# Patient Record
Sex: Female | Born: 1970
Health system: Southern US, Community
[De-identification: ages and names within clinical notes are randomized; demographics above are authoritative.]

## PROBLEM LIST (undated history)

## (undated) DIAGNOSIS — Z8709 Personal history of other diseases of the respiratory system: Secondary | ICD-10-CM

## (undated) DIAGNOSIS — F32A Depression, unspecified: Secondary | ICD-10-CM

## (undated) DIAGNOSIS — I8500 Esophageal varices without bleeding: Secondary | ICD-10-CM

## (undated) DIAGNOSIS — Z87898 Personal history of other specified conditions: Secondary | ICD-10-CM

## (undated) DIAGNOSIS — F329 Major depressive disorder, single episode, unspecified: Secondary | ICD-10-CM

## (undated) DIAGNOSIS — E8801 Alpha-1-antitrypsin deficiency: Secondary | ICD-10-CM

## (undated) DIAGNOSIS — Z9049 Acquired absence of other specified parts of digestive tract: Secondary | ICD-10-CM

## (undated) DIAGNOSIS — T7840XA Allergy, unspecified, initial encounter: Secondary | ICD-10-CM

## (undated) DIAGNOSIS — J45909 Unspecified asthma, uncomplicated: Secondary | ICD-10-CM

## (undated) DIAGNOSIS — H269 Unspecified cataract: Secondary | ICD-10-CM

## (undated) DIAGNOSIS — K7031 Alcoholic cirrhosis of liver with ascites: Secondary | ICD-10-CM

## (undated) DIAGNOSIS — K769 Liver disease, unspecified: Secondary | ICD-10-CM

## (undated) DIAGNOSIS — F419 Anxiety disorder, unspecified: Secondary | ICD-10-CM

## (undated) HISTORY — DX: Unspecified asthma, uncomplicated: J45.909

## (undated) HISTORY — DX: Depression, unspecified: F32.A

## (undated) HISTORY — DX: Major depressive disorder, single episode, unspecified: F32.9

## (undated) HISTORY — DX: Personal history of other specified conditions: Z87.898

## (undated) HISTORY — DX: Unspecified cataract: H26.9

## (undated) HISTORY — DX: Allergy, unspecified, initial encounter: T78.40XA

## (undated) HISTORY — DX: Personal history of other diseases of the respiratory system: Z87.09

## (undated) HISTORY — DX: Esophageal varices without bleeding: I85.00

## (undated) HISTORY — DX: Alcoholic cirrhosis of liver with ascites: K70.31

## (undated) HISTORY — DX: Acquired absence of other specified parts of digestive tract: Z90.49

## (undated) HISTORY — DX: Liver disease, unspecified: K76.9

## (undated) HISTORY — DX: Alpha-1-antitrypsin deficiency: E88.01

## (undated) HISTORY — DX: Anxiety disorder, unspecified: F41.9

---

## 1998-07-23 HISTORY — PX: CHOLECYSTECTOMY: SHX55

## 2014-10-19 ENCOUNTER — Telehealth: Payer: Self-pay

## 2014-10-19 NOTE — Telephone Encounter (Signed)
Rec'd from [employer] forward 26 pages to Historical Provider

## 2014-10-20 ENCOUNTER — Telehealth: Payer: Self-pay

## 2014-10-20 NOTE — Telephone Encounter (Signed)
Received medical records from [facility]. 10/20/14/ss

## 2014-10-21 ENCOUNTER — Encounter: Payer: Self-pay | Admitting: Internal Medicine

## 2014-12-25 ENCOUNTER — Ambulatory Visit (INDEPENDENT_AMBULATORY_CARE_PROVIDER_SITE_OTHER): Payer: 59 | Admitting: Physician Assistant

## 2014-12-25 VITALS — BP 133/88 | HR 94 | Temp 98.3°F | Resp 20 | Ht 64.0 in | Wt 204.5 lb

## 2014-12-25 DIAGNOSIS — L559 Sunburn, unspecified: Secondary | ICD-10-CM | POA: Diagnosis not present

## 2014-12-25 MED ORDER — LIDOCAINE-PRILOCAINE 2.5-2.5 % EX CREA: 1.0000 "application " | TOPICAL_CREAM | CUTANEOUS | Status: DC | PRN

## 2014-12-25 MED ORDER — NAPROXEN 500 MG PO TABS: 500.0000 mg | ORAL_TABLET | Freq: Two times a day (BID) | ORAL | Status: DC

## 2014-12-25 NOTE — Progress Notes (Deleted)
   Subjective:  This chart was scribed for Nena Jordan, MD by [facility], medical scribe at [facility].The patient was seen in exam room ** and the patient's care was started at 4:43 PM.   Patient ID: Kara Torres, female    DOB: [birth year], 44 y.o.   MRN: [id number] Chief Complaint  Patient presents with  . Sunburn  . Depression    Per Triage Screen    HPI HPI Comments: Kara Torres is a 44 y.o. female who presents to [facility] complaining of ***    Review of Systems     Objective:  BP 133/88 mmHg  Pulse 94  Temp(Src) 98.3 F (36.8 C) (Oral)  Resp 20  Ht 5\' 4"  (1.626 m)  Wt 204 lb 8 oz (92.761 kg)  BMI 35.09 kg/m2  SpO2 99% Physical Exam  Vitals reviewed. CONSTITUTIONAL: Well developed/well nourished HEAD: Normocephalic/atraumatic EYES: EOMI/PERRL ENMT: Mucous membranes moist NECK: supple no meningeal signs SPINE/BACK:entire spine nontender CV: S1/S2 noted, no murmurs/rubs/gallops noted LUNGS: Lungs are clear to auscultation bilaterally, no apparent distress ABDOMEN: soft, nontender, no rebound or guarding, bowel sounds noted throughout abdomen GU:no cva tenderness NEURO: Pt is awake/alert/appropriate, moves all extremitiesx4.  No facial droop.   EXTREMITIES: pulses normal/equal, full ROM SKIN: warm, color normal PSYCH: no abnormalities of mood noted, alert and oriented to situation     Assessment & Plan:     *** {Add scribe attestation statement}
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
## 2014-12-25 NOTE — Patient Instructions (Signed)
Sunburn Sunburn is damage to the skin caused by overexposure to ultraviolet (UV) rays. People with light skin or a fair complexion may be more susceptible to sunburn. Repeated sun exposure causes early skin aging such as wrinkles and sun spots. It also increases the risk of skin cancer. CAUSES A sunburn is caused by getting too much UV radiation from the sun. SYMPTOMS  Red or pink skin.  Soreness and swelling.  Pain.  Blisters.  Peeling skin.  Headache, fever, and fatigue if sunburn covers a large area. TREATMENT  Your caregiver may tell you to take certain medicines to lessen inflammation.  Your caregiver may have you use hydrocortisone cream or spray to help with itching and inflammation.  Your caregiver may prescribe an antibiotic cream to use on blisters. HOME CARE INSTRUCTIONS   Avoid further exposure to the sun.  Cool baths and cool compresses may be helpful if used several times per day. Do not apply ice, since this may result in more damage to the skin.  Only take over-the-counter or prescription medicines for pain, discomfort, or fever as directed by your caregiver.  Use aloe or other over-the-counter sunburn creams or gels on your skin. Do not apply these creams or gels on blisters.  Drink enough fluids to keep your urine clear or pale yellow.  Do not break blisters. If blisters break, your caregiver may recommend an antibiotic cream to apply to the affected area. PREVENTION   Try to avoid the sun between 10:00 a.m. and 4:00 p.m. when it is the strongest.  Apply sunscreen at least 30 minutes before exposure to the sun.  Always wear protective hats, clothing, and sunglasses with UV protection.  Avoid medicines, herbs, and foods that increase your sensitivity to sunlight.  Avoid tanning beds. SEEK IMMEDIATE MEDICAL CARE IF:   You have a fever.  Your pain is uncontrolled with medicine.  You start to vomit or have diarrhea.  You feel faint or develop a  headache with confusion.  You develop severe blistering.  You have a pus-like (purulent) discharge coming from the blisters.  Your burn becomes more painful and swollen. MAKE SURE YOU:  Understand these instructions.  Will watch your condition.  Will get help right away if you are not doing well or get worse. Document Released: 04/18/2005 Document Revised: 11/03/2012 Document Reviewed: 12/31/2010 ExitCare Patient Information 2015 [employer]. This information is not intended to replace advice given to you by your health care provider. Make sure you discuss any questions you have with your health care provider.  

## 2014-12-31 ENCOUNTER — Encounter: Payer: Self-pay | Admitting: Internal Medicine

## 2014-12-31 ENCOUNTER — Ambulatory Visit (INDEPENDENT_AMBULATORY_CARE_PROVIDER_SITE_OTHER): Payer: 59 | Admitting: Internal Medicine

## 2014-12-31 ENCOUNTER — Other Ambulatory Visit (INDEPENDENT_AMBULATORY_CARE_PROVIDER_SITE_OTHER): Payer: 59

## 2014-12-31 VITALS — BP 128/84 | HR 195 | Ht 63.25 in | Wt 207.2 lb

## 2014-12-31 DIAGNOSIS — R748 Abnormal levels of other serum enzymes: Secondary | ICD-10-CM | POA: Diagnosis not present

## 2014-12-31 DIAGNOSIS — K760 Fatty (change of) liver, not elsewhere classified: Secondary | ICD-10-CM

## 2014-12-31 DIAGNOSIS — D649 Anemia, unspecified: Secondary | ICD-10-CM

## 2014-12-31 DIAGNOSIS — E8801 Alpha-1-antitrypsin deficiency: Secondary | ICD-10-CM | POA: Diagnosis not present

## 2014-12-31 LAB — CBC WITH DIFFERENTIAL/PLATELET
Basophils Absolute: 0 10*3/uL (ref 0.0–0.1)
Basophils Relative: 0.3 % (ref 0.0–3.0)
Eosinophils Absolute: 0.3 10*3/uL (ref 0.0–0.7)
Eosinophils Relative: 3 % (ref 0.0–5.0)
HCT: 31.5 % — ABNORMAL LOW (ref 36.0–46.0)
Hemoglobin: 10.2 g/dL — ABNORMAL LOW (ref 12.0–15.0)
Lymphocytes Relative: 12.5 % (ref 12.0–46.0)
Lymphs Abs: 1.1 10*3/uL (ref 0.7–4.0)
MCHC: 32.3 g/dL (ref 30.0–36.0)
MCV: 79.9 fl (ref 78.0–100.0)
Monocytes Absolute: 0.7 10*3/uL (ref 0.1–1.0)
Monocytes Relative: 7.6 % (ref 3.0–12.0)
Neutro Abs: 6.6 10*3/uL (ref 1.4–7.7)
Neutrophils Relative %: 76.6 % (ref 43.0–77.0)
Platelets: 305 10*3/uL (ref 150.0–400.0)
RBC: 3.94 Mil/uL (ref 3.87–5.11)
RDW: 19.5 % — ABNORMAL HIGH (ref 11.5–15.5)
WBC: 8.7 10*3/uL (ref 4.0–10.5)

## 2014-12-31 LAB — VITAMIN B12: Vitamin B-12: 337 pg/mL (ref 211–911)

## 2014-12-31 LAB — FOLATE: Folate: 7.6 ng/mL (ref >5.9)

## 2014-12-31 LAB — PROTIME-INR
INR: 1.1 ratio — AB (ref 0.8–1.0)
Prothrombin Time: 12 s (ref 9.6–13.1)

## 2014-12-31 LAB — FERRITIN: Ferritin: 11.3 ng/mL (ref 10.0–291.0)

## 2014-12-31 NOTE — Patient Instructions (Addendum)
Your physician has requested that you go to the basement for lab work before leaving today. You have been scheduled for an abdominal ultrasound at [facility] (1st floor of hospital) on 01/18/15 at 930 am. Please arrive 15 minutes prior to your appointment for registration. Make certain not to have anything to eat or drink 6 hours prior to your appointment. Should you need to reschedule your appointment, please contact radiology at [phone]. This test typically takes about 30 minutes to perform.  Dr Corky Crafts ,[place]

## 2014-12-31 NOTE — Progress Notes (Signed)
Kara Torres [birth year] [id number]  Note: This dictation was prepared with Dragon digital system. Any transcriptional errors that result from this procedure are unintentional.   History of Present Illness: This is a 44 year old white female here to establish follow up for r abnormal liver function tests. She brought with her records from [facility] from Dr. Marvel Plan dating back to April 2014 and June 2014 when she was first diagnosed with abnormal liver function tests.No OV since 2 years ago. Her transaminases were 92 and 98 respectively with normal alkaline phosphatase ,serum albumin and bilirubin. 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 Her degree of inflammation on liver biopsy was grade 1-2 and degree of fibrosis was grade 1. Is also as occasional swelling in her ankles but denies any abdominal fluid retention or mental changes. She admits to easy bruising and has had hemorrhoidal bleeding and hemorrhoidal band ligation recently. She has heavy periods and sdecondary microcytic  microcytic anemia. Most recent hemoglobin 10.1 hematocrit 32.3. She was instructed to continue serious weight loss and was successful for a  while losing about 40 pounds but she has gained at least 15lbs  back so she is currently at 207 pounds. She has a sedentary job. She does not exercise. Most recent liver test at dr Fox's  office showed AST 98 ALT of 96. Platelet count of 260,000. Last full colonoscopy was in 2010. She has a irritable bowel syndrome.and symptomatic hemorrhoids. She is treated for hyperlipidemia with Lipitor 40 mg daily by Dr Hassell Done in [place].    Past Medical History  Diagnosis Date  . Allergy   . Anxiety   . Asthma   . Depression   . Liver disease   . Alpha-1-antitrypsin deficiency     Past Surgical History  Procedure Laterality Date  . Cholecystectomy  2000    Allergies  Allergen  Reactions  . Epinephrine   . Hydrocodone Rash    Family history and social history have been reviewed.  Review of Systems: fatigue, low volume hematochezia  The remainder of the 10 point ROS is negative except as outlined in the H&P  Physical Exam: General Appearance Well developed, in no distress, overweight Eyes  Non icteric  HEENT  Non traumatic, normocephalic  Mouth No lesion, tongue papillated, no cheilosis Neck Supple without adenopathy, thyroid not enlarged, no carotid bruits, no JVD Lungs Clear to auscultation bilaterally COR Normal S1, normal S2, regular rhythm, no murmur, quiet precordium Abdomen obese soft with minimal tenderness in right lower quadrant. Normoactive bowel sounds. Liver edge at costal margin. No ascites Rectal painful digital exam with the normal rectal sphincter tone mild irregularity of the anal canal quite tender. Stool is Hemoccult-negative Extremities  No pedal edema Skin No lesions Neurological Alert and oriented x 3 Psychological Normal mood and affect  Assessment and Plan:   44 year old white female with the phenotype zz homozygous  alpha-1 antitrypsin deficiency. Having chronic liver disease due to fatty liver. Her liver synthetic activity are reasonably good having albumen of 3.3. We need to obtain prothrombin time. Liver ultrasound with elastography .Alpha fetoprotein. Have liver function tests repeated every 3 months and ultrasound every 2 years.  Overweight. Patient needs to attempt serious weight loss.  Anemia, mostly microcytic. Likely due to menorrhagia. We will check her B12 as well as iron studies  Hyperlipidemia. Her baseline cholesterol was over 300 but currently it is over 100 due to Lipitor 40 mg daily. From  the liver standpoint I would like her to be able to reduce the Lipitor just slightly cause of possibility of liver injury. I am interested in reducing the Lipitor dose to the minimum dose comfortable to DR Hassell Done  With regards to  controlling  hyperlipidemia, She may have to be evaluated for metabolic syndrome.    Kara Torres 12/31/2014

## 2015-01-01 LAB — AFP TUMOR MARKER: AFP-Tumor Marker: 3.6 ng/mL (ref <6.1)

## 2015-01-03 LAB — IBC PANEL
Iron: 16 ug/dL — ABNORMAL LOW (ref 42–145)
Saturation Ratios: 3.1 % — ABNORMAL LOW (ref 20.0–50.0)
Transferrin: 364 mg/dL — ABNORMAL HIGH (ref 212.0–360.0)

## 2015-01-04 ENCOUNTER — Other Ambulatory Visit: Payer: Self-pay | Admitting: *Deleted

## 2015-01-04 DIAGNOSIS — D509 Iron deficiency anemia, unspecified: Secondary | ICD-10-CM

## 2015-01-04 MED ORDER — FERROUS SULFATE 325 (65 FE) MG PO TBEC: 325.0000 mg | DELAYED_RELEASE_TABLET | Freq: Three times a day (TID) | ORAL | Status: DC

## 2015-01-04 NOTE — Progress Notes (Signed)
Quick Note:  Patient notified of results and recommendations. Patient given appointment dates and times. ______

## 2015-01-06 NOTE — Progress Notes (Signed)
  Medical screening examination/treatment/procedure(s) were performed by non-physician practitioner and as supervising physician I was immediately available for consultation/collaboration.     

## 2015-01-10 ENCOUNTER — Telehealth: Payer: Self-pay | Admitting: Internal Medicine

## 2015-01-10 NOTE — Telephone Encounter (Signed)
Spoke with patient and she states she is having heavy periods. She states she is dizzy and SOB. She thought if she got iron it would make her better. Instructed patient to go to ED if she is dizzy, SOB and having heavy bleeding since she does not have a GYN in [place].

## 2015-01-10 NOTE — Telephone Encounter (Signed)
Yes, , we could also check CBC in our office to see if the dizziness is related to anemia,  I suspect it is NOT the anemia.

## 2015-01-10 NOTE — Telephone Encounter (Signed)
Spoke with patient and she is going to Urgent Care at 3:15 PM for OV today to try to get bleeding stopped.

## 2015-01-17 ENCOUNTER — Telehealth (HOSPITAL_COMMUNITY): Payer: Self-pay

## 2015-01-17 NOTE — Telephone Encounter (Signed)
Called to remind pt of 930am appt at [facility]..the patient agreed to arrive 15 min early and stay npo 6 hrs prior to exam. AW

## 2015-01-18 ENCOUNTER — Ambulatory Visit (HOSPITAL_COMMUNITY)
Admission: RE | Admit: 2015-01-18 | Discharge: 2015-01-18 | Disposition: A | Discharge status: Home / Self Care | Payer: 59 | Source: Ambulatory Visit | Attending: Internal Medicine | Admitting: Internal Medicine

## 2015-01-18 DIAGNOSIS — Z9049 Acquired absence of other specified parts of digestive tract: Secondary | ICD-10-CM | POA: Diagnosis not present

## 2015-01-18 DIAGNOSIS — D649 Anemia, unspecified: Secondary | ICD-10-CM | POA: Insufficient documentation

## 2015-01-18 DIAGNOSIS — R748 Abnormal levels of other serum enzymes: Secondary | ICD-10-CM | POA: Insufficient documentation

## 2015-01-18 DIAGNOSIS — K760 Fatty (change of) liver, not elsewhere classified: Secondary | ICD-10-CM

## 2015-01-25 ENCOUNTER — Encounter (HOSPITAL_COMMUNITY): Payer: Self-pay

## 2015-01-25 ENCOUNTER — Encounter (HOSPITAL_COMMUNITY)
Admission: RE | Admit: 2015-01-25 | Discharge: 2015-01-25 | Disposition: A | Discharge status: Home / Self Care | Payer: 59 | Source: Ambulatory Visit | Attending: Internal Medicine | Admitting: Internal Medicine

## 2015-01-25 VITALS — BP 132/89 | HR 86 | Temp 98.3°F | Resp 18

## 2015-01-25 DIAGNOSIS — D509 Iron deficiency anemia, unspecified: Secondary | ICD-10-CM | POA: Diagnosis present

## 2015-01-25 MED ORDER — SODIUM CHLORIDE 0.9 % IV SOLN
510.0000 mg | INTRAVENOUS | Status: DC
  Administered 2015-01-25: 510 mg via INTRAVENOUS
  Filled 2015-01-25: qty 17

## 2015-01-25 MED ORDER — SODIUM CHLORIDE 0.9 % IV SOLN
Freq: Every day | INTRAVENOUS | Status: DC
  Administered 2015-01-25: 14:00:00 via INTRAVENOUS

## 2015-01-25 NOTE — Discharge Instructions (Signed)

## 2015-01-25 NOTE — Progress Notes (Signed)
Pt received her first feraheme infusion today.  Pt tolerated well.  No adverse reactions.  68min post infusion observation was observed.  Pt d/c ambulatory to the lobby for d/c.

## 2015-01-26 ENCOUNTER — Telehealth: Payer: Self-pay | Admitting: Internal Medicine

## 2015-01-26 NOTE — Telephone Encounter (Signed)
Spoke with pt and she is aware. Appt cancelled. 

## 2015-01-26 NOTE — Telephone Encounter (Signed)
Pt had her first feraheme treatment yesterday and states she was told to report any side effects. Pts IV was in her Left arm. Pt states that this morning when she woke up her right foot is swollen and very tender to the touch. Discussed with pt that it did not sound like a SE from the feraheme but that I would check with Dr. Olevia Perches. Please advise.

## 2015-01-26 NOTE — Telephone Encounter (Signed)
Please cancel the second infusion on 02/01/2015. Pt to call back if swelling persists another 24 hours. Keep leg elevated.

## 2015-01-27 ENCOUNTER — Telehealth: Payer: Self-pay | Admitting: Internal Medicine

## 2015-01-27 NOTE — Telephone Encounter (Signed)
Right foot is still red and swollen. She has kept it elevated. Please, advise.

## 2015-01-27 NOTE — Telephone Encounter (Signed)
Left a message for patient to call back. 

## 2015-01-27 NOTE — Telephone Encounter (Signed)
Pt ought to contact her PCP in [place], Dr Hassell Done. My guess is that  she has a dependent edema due to IV infusion, but we need to r/o gout, or  DVT.

## 2015-01-27 NOTE — Telephone Encounter (Signed)
Patient notified of recommendations. 

## 2015-01-28 ENCOUNTER — Telehealth: Payer: Self-pay | Admitting: Internal Medicine

## 2015-01-28 NOTE — Telephone Encounter (Signed)
Per patient she has gout per her PCP. She is asking if she can get the second iron infusion. Please, advise.

## 2015-01-28 NOTE — Telephone Encounter (Signed)
My have a second Iron infusion if gout under control.

## 2015-01-31 NOTE — Telephone Encounter (Signed)
Spoke with patient and she will call back when she the gout is under control.

## 2015-02-01 ENCOUNTER — Encounter (HOSPITAL_COMMUNITY): Admission: RE | Admit: 2015-02-01 | Payer: 59 | Source: Ambulatory Visit

## 2015-02-01 ENCOUNTER — Other Ambulatory Visit (HOSPITAL_COMMUNITY): Payer: Self-pay | Admitting: Internal Medicine

## 2015-02-01 DIAGNOSIS — D509 Iron deficiency anemia, unspecified: Secondary | ICD-10-CM | POA: Diagnosis not present

## 2016-01-26 ENCOUNTER — Encounter: Payer: Self-pay | Admitting: Family Medicine

## 2016-01-26 ENCOUNTER — Ambulatory Visit (INDEPENDENT_AMBULATORY_CARE_PROVIDER_SITE_OTHER): Payer: No Typology Code available for payment source | Admitting: Family Medicine

## 2016-01-26 VITALS — BP 140/98 | HR 101 | Temp 98.6°F | Resp 14 | Ht 64.0 in | Wt 219.0 lb

## 2016-01-26 DIAGNOSIS — Z0000 Encounter for general adult medical examination without abnormal findings: Secondary | ICD-10-CM

## 2016-01-26 DIAGNOSIS — F4323 Adjustment disorder with mixed anxiety and depressed mood: Secondary | ICD-10-CM

## 2016-01-26 DIAGNOSIS — L559 Sunburn, unspecified: Secondary | ICD-10-CM

## 2016-01-26 DIAGNOSIS — K219 Gastro-esophageal reflux disease without esophagitis: Secondary | ICD-10-CM

## 2016-01-26 LAB — COMPLETE METABOLIC PANEL WITH GFR
ALBUMIN: 3.8 g/dL (ref 3.6–5.1)
ALK PHOS: 88 U/L (ref 33–115)
ALT: 57 U/L — ABNORMAL HIGH (ref 6–29)
AST: 50 U/L — AB (ref 10–30)
BILIRUBIN TOTAL: 0.6 mg/dL (ref 0.2–1.2)
BUN: 9 mg/dL (ref 7–25)
CO2: 22 mmol/L (ref 20–31)
Calcium: 8.8 mg/dL (ref 8.6–10.2)
Chloride: 102 mmol/L (ref 98–110)
Creat: 0.8 mg/dL (ref 0.50–1.10)
GFR, Est African American: >89 mL/min (ref >=60)
GLUCOSE: 84 mg/dL (ref 65–99)
Potassium: 4.3 mmol/L (ref 3.5–5.3)
SODIUM: 138 mmol/L (ref 135–146)
Total Protein: 7.1 g/dL (ref 6.1–8.1)

## 2016-01-26 LAB — CBC WITH DIFFERENTIAL/PLATELET
BASOS ABS: 65 {cells}/uL (ref 0–200)
Basophils Relative: 1 %
EOS PCT: 2 %
Eosinophils Absolute: 130 cells/uL (ref 15–500)
HCT: 39.7 % (ref 35.0–45.0)
Hemoglobin: 13.4 g/dL (ref 11.7–15.5)
Lymphocytes Relative: 23 %
Lymphs Abs: 1495 cells/uL (ref 850–3900)
MCH: 31.1 pg (ref 27.0–33.0)
MCHC: 33.8 g/dL (ref 32.0–36.0)
MCV: 92.1 fL (ref 80.0–100.0)
MONOS PCT: 6 %
MPV: 9.3 fL (ref 7.5–12.5)
Monocytes Absolute: 390 cells/uL (ref 200–950)
NEUTROS PCT: 68 %
Neutro Abs: 4420 cells/uL (ref 1500–7800)
PLATELETS: 237 10*3/uL (ref 140–400)
RBC: 4.31 MIL/uL (ref 3.80–5.10)
RDW: 15.4 % — ABNORMAL HIGH (ref 11.0–15.0)
WBC: 6.5 10*3/uL (ref 3.8–10.8)

## 2016-01-26 LAB — LIPID PANEL
CHOLESTEROL: 173 mg/dL (ref 125–200)
HDL: 48 mg/dL (ref >=46)
LDL Cholesterol: 88 mg/dL (ref <130)
Total CHOL/HDL Ratio: 3.6 Ratio (ref <=5.0)
Triglycerides: 187 mg/dL — ABNORMAL HIGH (ref <150)
VLDL: 37 mg/dL — AB (ref <30)

## 2016-01-26 MED ORDER — PANTOPRAZOLE SODIUM 40 MG PO TBEC
40.0000 mg | DELAYED_RELEASE_TABLET | Freq: Every day | ORAL | Status: DC
Start: 2016-01-26 — End: 2016-06-04

## 2016-01-26 MED ORDER — FLUOXETINE HCL 40 MG PO CAPS: 40.0000 mg | ORAL_CAPSULE | Freq: Every day | ORAL | Status: DC

## 2016-01-26 MED ORDER — NAPROXEN 500 MG PO TABS: 500.0000 mg | ORAL_TABLET | Freq: Two times a day (BID) | ORAL | Status: DC

## 2016-01-26 NOTE — Patient Instructions (Addendum)
Make an appointment soon for PAP smear. Someone from dental office will call you about appt. Call to make an appointment for mammogram. Careful of fats, cholesterol, and carbs in diet. Try to exercise regularly. Try to give up those last few cigarettes.

## 2016-01-26 NOTE — Progress Notes (Signed)
Patient ID: Kara Torres, female   DOB: [birth year], 45 y.o.   MRN: [id number]   Kara Torres, is a 45 y.o. female  [id number]  [id number]  DOB - 1970/10/18  CC:  Chief Complaint  Patient presents with  . Establish Care  . Knee Pain    right knee pain        HPI: Kara Torres is a 45 y.o. female here to establish care. She has a history of Depression and anxiety for which she takes Prozac and Klonipin. She has seasonal allergies causing some asthma symptoms for which she takes OTC claritin. She takes OTC omeprozole for GERD. She has a history of Alpha 1 antitrypsin, anemia and fatty liver disease. She complains today of left knee pain. She needs refills on Prozac and atorvastatin for high cholesterol. She request a referral to dentist with her orange card.  Health maintenance:  Her Tdap is up to date. She is in need of PAP and mammogram. She has been screened in past for HIV>  Allergies  Allergen Reactions  . Epinephrine   . Hydrocodone Rash   Past Medical History  Diagnosis Date  . Allergy   . Anxiety   . Asthma   . Depression   . Liver disease   . Alpha-1-antitrypsin deficiency [facility])    Current Outpatient Prescriptions on File Prior to Visit  Medication Sig Dispense Refill  . atorvastatin (LIPITOR) 40 MG tablet Take 40 mg by mouth daily.    . clonazePAM (KLONOPIN) 1 MG tablet Take 1 mg by mouth 2 (two) times daily as needed for anxiety.    . fluticasone (FLONASE) 50 MCG/ACT nasal spray Place 2 sprays into both nostrils 2 (two) times daily.    . Multiple Vitamins-Minerals (AIRBORNE PO) Take 2 tablets by mouth daily. Reported on 01/26/2016    . omeprazole (PRILOSEC) 20 MG capsule Take 20 mg by mouth daily. Reported on 01/26/2016    . diphenhydrAMINE (SOMINEX) 25 MG tablet Take 25 mg by mouth at bedtime. Reported on 01/26/2016    . ferrous sulfate 325 (65 FE) MG EC tablet Take 1 tablet (325 mg total) by mouth 3 (three) times daily with meals. (Patient not taking:  Reported on 01/26/2016) 120 tablet 3   No current facility-administered medications on file prior to visit.   Family History  Problem Relation Age of Onset  . Diabetes Father   . Hypertension Father   . Hyperlipidemia Father   . Mental illness Father   . Stroke Father   . Stroke Mother   . Hyperlipidemia Mother   . Diabetes Mother   . Diabetes Maternal Grandmother   . Heart disease Maternal Grandmother   . Hypertension Maternal Grandmother   . Heart attack Maternal Grandmother    Social History   Social History  . Marital Status: Unknown    Spouse Name: N/A  . Number of Children: N/A  . Years of Education: N/A   Occupational History  . Customer Service    Social History Main Topics  . Smoking status: Current Some Day Smoker  . Smokeless tobacco: Never Used  . Alcohol Use: 1.2 oz/week    0 Standard drinks or equivalent, 2 Cans of beer per week     Comment: occ  . Drug Use: No  . Sexual Activity: Not on file   Other Topics Concern  . Not on file   Social History Narrative    Review of Systems: Constitutional: Negative for fever, chills, appetite change, weight loss,  Fatigue. Skin: Negative for rashes or lesions of concern. HENT: Negative for ear pain, ear discharge.nose bleeds Eyes: Negative for pain, discharge, redness, itching and visual disturbance. Neck: Negative for pain, stiffness Respiratory: Negative for cough, shortness of breath,   Cardiovascular: Negative for chest pain, palpitations and leg swelling. Gastrointestinal: Negative for abdominal pain, nausea, vomiting, diarrhea, constipations Genitourinary: Negative for dysuria, urgency, frequency, hematuria,  Musculoskeletal: Negative for back pain, joint pain, joint  swelling, and gait problem.Negative for weakness. Neurological: Negative for dizziness, tremors, seizures, syncope,   light-headedness, numbness and headaches.  Hematological: Negative for easy bruising or bleeding Psychiatric/Behavioral:  Negative for depression, anxiety, decreased concentration, confusion   Objective:   Filed Vitals:   01/26/16 1106  BP: 140/98  Pulse: 101  Temp: 98.6 F (37 C)  Resp: 14    Physical Exam: Constitutional: Patient appears well-developed and well-nourished. No distress. HENT: Normocephalic, atraumatic, External right and left ear normal. Oropharynx is clear and moist.  Eyes: Conjunctivae and EOM are normal. PERRLA, no scleral icterus. Neck: Normal ROM. Neck supple. No lymphadenopathy, No thyromegaly. CVS: RRR, S1/S2 +, no murmurs, no gallops, no rubs Pulmonary: Effort and breath sounds normal, no stridor, rhonchi, wheezes, rales.  Abdominal: Soft. Normoactive BS,, no distension, tenderness, rebound or guarding.  Musculoskeletal: Normal range of motion. No edema and no tenderness.  Neuro: Alert.Normal muscle tone coordination. Non-focal Skin: Skin is warm and dry. No rash noted. Not diaphoretic. No erythema. No pallor. Psychiatric: Normal mood and affect. Behavior, judgment, thought content normal.  Lab Results  Component Value Date   WBC 8.7 12/31/2014   HGB 10.2* 12/31/2014   HCT 31.5* 12/31/2014   MCV 79.9 12/31/2014   PLT 305.0 12/31/2014   No results found for: CREATININE, BUN, NA, K, CL, CO2  No results found for: HGBA1C Lipid Panel  No results found for: CHOL, TRIG, HDL, CHOLHDL, VLDL, LDLCALC     Assessment and plan:   1. Health care maintenance  - COMPLETE METABOLIC PANEL WITH GFR - CBC with Differential - Lipid panel - TSH - MM DIGITAL SCREENING BILATERAL; Future - Ambulatory referral to Dentistry - naproxen (NAPROSYN) 500 MG tablet; Take 1 tablet (500 mg total) by mouth 2 (two) times daily with a meal.  Dispense: 60 tablet; Refill: 1   3. Adjustment disorder with mixed anxiety and depressed mood  - FLUoxetine (PROZAC) 40 MG capsule; Take 1 capsule (40 mg total) by mouth daily.  Dispense: 30 capsule; Refill: 3  4. Gastroesophageal reflux disease,  esophagitis presence not specified - pantoprazole (PROTONIX) 40 MG tablet; Take 1 tablet (40 mg total) by mouth daily.  Dispense: 30 tablet; Refill: 3   Return in about 4 weeks (around 02/23/2016). for PAP and BP recheck.  The patient was given clear instructions to go to ER or return to medical center if symptoms don't improve, worsen or new problems develop. The patient verbalized understanding.    Micheline Chapman FNP  01/26/2016, 11:50 AM

## 2016-01-27 LAB — TSH: TSH: 2.62 mIU/L

## 2016-03-02 ENCOUNTER — Ambulatory Visit: Payer: No Typology Code available for payment source | Admitting: Family Medicine

## 2016-03-16 ENCOUNTER — Other Ambulatory Visit (HOSPITAL_COMMUNITY)
Admission: RE | Admit: 2016-03-16 | Discharge: 2016-03-16 | Disposition: A | Discharge status: Home / Self Care | Payer: No Typology Code available for payment source | Source: Ambulatory Visit | Attending: Family Medicine | Admitting: Family Medicine

## 2016-03-16 ENCOUNTER — Ambulatory Visit (INDEPENDENT_AMBULATORY_CARE_PROVIDER_SITE_OTHER): Payer: No Typology Code available for payment source | Admitting: Family Medicine

## 2016-03-16 ENCOUNTER — Encounter: Payer: Self-pay | Admitting: Family Medicine

## 2016-03-16 VITALS — BP 133/97 | HR 97 | Temp 98.3°F | Resp 16 | Ht 64.0 in | Wt 222.0 lb

## 2016-03-16 DIAGNOSIS — Z124 Encounter for screening for malignant neoplasm of cervix: Secondary | ICD-10-CM

## 2016-03-16 DIAGNOSIS — Z1151 Encounter for screening for human papillomavirus (HPV): Secondary | ICD-10-CM | POA: Insufficient documentation

## 2016-03-16 DIAGNOSIS — Z01419 Encounter for gynecological examination (general) (routine) without abnormal findings: Secondary | ICD-10-CM | POA: Insufficient documentation

## 2016-03-16 MED ORDER — FLUTICASONE PROPIONATE 50 MCG/ACT NA SUSP: 2.0000 | Freq: Two times a day (BID) | NASAL | 2 refills | Status: DC

## 2016-03-16 MED FILL — FLUTICASONE PROP 50 MCG SPR: 50 | 20 days supply | Qty: 16 | Fill #0

## 2016-03-19 LAB — CYTOLOGY - PAP

## 2016-03-20 NOTE — Progress Notes (Signed)
Kara Torres, is a 45 y.o. female  CP:[id number]  DOB - [birth year]  CC:  Chief Complaint  Patient presents with  . Hypertension  . Gynecologic Exam       HPI: Kara Torres is a 45 y.o. female here for PAP smear/ She has recentl had other health maintenance addressed. She denies any GYN problems  Allergies  Allergen Reactions  . Epinephrine   . Hydrocodone Rash   Past Medical History:  Diagnosis Date  . Allergy   . Alpha-1-antitrypsin deficiency ([place])   . Anxiety   . Asthma   . Depression   . Liver disease    Current Outpatient Prescriptions on File Prior to Visit  Medication Sig Dispense Refill  . atorvastatin (LIPITOR) 40 MG tablet Take 40 mg by mouth daily.    . clonazePAM (KLONOPIN) 1 MG tablet Take 1 mg by mouth 2 (two) times daily as needed for anxiety.    Marland Kitchen desloratadine (CLARINEX) 5 MG tablet Take 5 mg by mouth daily.    . diphenhydrAMINE (SOMINEX) 25 MG tablet Take 25 mg by mouth at bedtime. Reported on 01/26/2016    . FLUoxetine (PROZAC) 40 MG capsule Take 1 capsule (40 mg total) by mouth daily. 30 capsule 3  . omeprazole (PRILOSEC) 20 MG capsule Take 20 mg by mouth daily. Reported on 01/26/2016    . ferrous sulfate 325 (65 FE) MG EC tablet Take 1 tablet (325 mg total) by mouth 3 (three) times daily with meals. (Patient not taking: Reported on 01/26/2016) 120 tablet 3  . Multiple Vitamins-Minerals (AIRBORNE PO) Take 2 tablets by mouth daily. Reported on 01/26/2016    . naproxen (NAPROSYN) 500 MG tablet Take 1 tablet (500 mg total) by mouth 2 (two) times daily with a meal. (Patient not taking: Reported on 03/16/2016) 60 tablet 1  . pantoprazole (PROTONIX) 40 MG tablet Take 1 tablet (40 mg total) by mouth daily. (Patient not taking: Reported on 03/16/2016) 30 tablet 3   No current facility-administered medications on file prior to visit.    Family History  Problem Relation Age of Onset  . Stroke Mother   . Hyperlipidemia Mother   . Diabetes  Mother   . Diabetes Father   . Hypertension Father   . Hyperlipidemia Father   . Mental illness Father   . Stroke Father   . Diabetes Maternal Grandmother   . Heart disease Maternal Grandmother   . Hypertension Maternal Grandmother   . Heart attack Maternal Grandmother    Social History   Social History  . Marital status: Unknown    Spouse name: N/A  . Number of children: N/A  . Years of education: N/A   Occupational History  . Customer Service    Social History Main Topics  . Smoking status: Current Some Day Smoker  . Smokeless tobacco: Never Used  . Alcohol use 1.2 oz/week    2 Cans of beer per week     Comment: occ  . Drug use: No  . Sexual activity: Not on file   Other Topics Concern  . Not on file   Social History Narrative  . No narrative on file    Review of Systems: deferred  Objective:   Vitals:   03/16/16 1457  BP: (!) 133/97  Pulse: 97  Resp: 16  Temp: 98.3 F (36.8 C)    Physical Exam: Deferred except for GYN  Cervix appears normal, there is no unusual discharge. There is no CMT or adnexal  tenderness.   Lab Results  Component Value Date   WBC 6.5 01/26/2016   HGB 13.4 01/26/2016   HCT 39.7 01/26/2016   MCV 92.1 01/26/2016   PLT 237 01/26/2016   Lab Results  Component Value Date   CREATININE 0.80 01/26/2016   BUN 9 01/26/2016   NA 138 01/26/2016   K 4.3 01/26/2016   CL 102 01/26/2016   CO2 22 01/26/2016    No results found for: HGBA1C Lipid Panel     Component Value Date/Time   CHOL 173 01/26/2016 1147   TRIG 187 (H) 01/26/2016 1147   HDL 48 01/26/2016 1147   CHOLHDL 3.6 01/26/2016 1147   VLDL 37 (H) 01/26/2016 1147   LDLCALC 88 01/26/2016 1147       Assessment and plan:   1. Cervical cancer screening  - Cytology - PAP [place]   Return if symptoms worsen or fail to improve.  The patient was given clear instructions to go to ER or return to medical center if symptoms don't improve, worsen or new problems  develop. The patient verbalized understanding.    Micheline Chapman FNP  03/20/2016, 12:28 PM

## 2016-04-18 ENCOUNTER — Other Ambulatory Visit: Payer: Self-pay | Admitting: Family Medicine

## 2016-04-18 DIAGNOSIS — Z1231 Encounter for screening mammogram for malignant neoplasm of breast: Secondary | ICD-10-CM

## 2016-04-20 ENCOUNTER — Other Ambulatory Visit: Payer: No Typology Code available for payment source

## 2016-04-30 ENCOUNTER — Ambulatory Visit
Admission: RE | Admit: 2016-04-30 | Discharge: 2016-04-30 | Disposition: A | Discharge status: Home / Self Care | Payer: No Typology Code available for payment source | Source: Ambulatory Visit | Attending: Family Medicine | Admitting: Family Medicine

## 2016-04-30 DIAGNOSIS — Z1231 Encounter for screening mammogram for malignant neoplasm of breast: Secondary | ICD-10-CM

## 2016-05-31 ENCOUNTER — Other Ambulatory Visit: Payer: Self-pay

## 2016-05-31 MED ORDER — ATORVASTATIN CALCIUM 40 MG PO TABS: 40.0000 mg | ORAL_TABLET | Freq: Every day | ORAL | 3 refills | Status: DC

## 2016-05-31 NOTE — Telephone Encounter (Signed)
Refill for lipitor sent into pharmacy. Thanks!  

## 2016-06-04 ENCOUNTER — Other Ambulatory Visit: Payer: Self-pay | Admitting: Family Medicine

## 2016-06-04 DIAGNOSIS — L559 Sunburn, unspecified: Secondary | ICD-10-CM

## 2016-06-04 DIAGNOSIS — K219 Gastro-esophageal reflux disease without esophagitis: Secondary | ICD-10-CM

## 2016-06-04 MED FILL — ?PANTOPRAZOLE SOD DR 40MG: 40 MG | 30 days supply | Qty: 30 | Fill #0

## 2016-06-04 MED FILL — ?NAPROXEN 500 MG TABLET: 500 MG | 30 days supply | Qty: 60 | Fill #0

## 2016-06-12 MED FILL — FLUTICASONE PROP 50 MCG SPR: 50 | 20 days supply | Qty: 16 | Fill #1

## 2016-06-12 MED FILL — ATORVASTATIN 40 MG TABLET: 40 | 30 days supply | Qty: 30 | Fill #0

## 2016-06-22 ENCOUNTER — Ambulatory Visit: Payer: No Typology Code available for payment source | Admitting: Family Medicine

## 2016-07-05 MED FILL — NAPROXEN 500 MG TABLET: 500 | 30 days supply | Qty: 60 | Fill #1

## 2016-07-05 MED FILL — ?PANTOPRAZOLE SOD DR 40MG: 40 MG | 30 days supply | Qty: 30 | Fill #1

## 2016-07-05 MED FILL — ATORVASTATIN 40 MG TABLET: 40 | 30 days supply | Qty: 30 | Fill #1

## 2016-07-13 ENCOUNTER — Ambulatory Visit (INDEPENDENT_AMBULATORY_CARE_PROVIDER_SITE_OTHER): Payer: Self-pay | Admitting: Family Medicine

## 2016-07-13 ENCOUNTER — Encounter: Payer: Self-pay | Admitting: Family Medicine

## 2016-07-13 VITALS — BP 142/92 | HR 102 | Temp 98.7°F | Resp 16 | Ht 64.0 in | Wt 215.0 lb

## 2016-07-13 DIAGNOSIS — I10 Essential (primary) hypertension: Secondary | ICD-10-CM

## 2016-07-13 DIAGNOSIS — K760 Fatty (change of) liver, not elsewhere classified: Secondary | ICD-10-CM

## 2016-07-13 DIAGNOSIS — E785 Hyperlipidemia, unspecified: Secondary | ICD-10-CM

## 2016-07-13 DIAGNOSIS — K219 Gastro-esophageal reflux disease without esophagitis: Secondary | ICD-10-CM

## 2016-07-13 DIAGNOSIS — M109 Gout, unspecified: Secondary | ICD-10-CM

## 2016-07-13 DIAGNOSIS — E669 Obesity, unspecified: Secondary | ICD-10-CM

## 2016-07-13 DIAGNOSIS — F411 Generalized anxiety disorder: Secondary | ICD-10-CM

## 2016-07-13 HISTORY — DX: Fatty (change of) liver, not elsewhere classified: K76.0

## 2016-07-13 HISTORY — DX: Hyperlipidemia, unspecified: E78.5

## 2016-07-13 HISTORY — DX: Obesity, unspecified: E66.9

## 2016-07-13 HISTORY — DX: Generalized anxiety disorder: F41.1

## 2016-07-13 HISTORY — DX: Essential (primary) hypertension: I10

## 2016-07-13 LAB — COMPLETE METABOLIC PANEL WITH GFR
ALT: 92 U/L — AB (ref 6–29)
AST: 111 U/L — ABNORMAL HIGH (ref 10–30)
Albumin: 4.1 g/dL (ref 3.6–5.1)
Alkaline Phosphatase: 83 U/L (ref 33–115)
BUN: 7 mg/dL (ref 7–25)
CALCIUM: 9 mg/dL (ref 8.6–10.2)
CHLORIDE: 109 mmol/L (ref 98–110)
CO2: 19 mmol/L — ABNORMAL LOW (ref 20–31)
CREATININE: 0.72 mg/dL (ref 0.50–1.10)
GFR, Est African American: >89 mL/min (ref >=60)
GFR, Est Non African American: >89 mL/min (ref >=60)
GLUCOSE: 84 mg/dL (ref 65–99)
POTASSIUM: 4.5 mmol/L (ref 3.5–5.3)
SODIUM: 139 mmol/L (ref 135–146)
Total Bilirubin: 0.4 mg/dL (ref 0.2–1.2)
Total Protein: 7.4 g/dL (ref 6.1–8.1)

## 2016-07-13 LAB — POCT GLYCOSYLATED HEMOGLOBIN (HGB A1C): Hemoglobin A1C: 5

## 2016-07-13 MED ORDER — PANTOPRAZOLE SODIUM 40 MG PO TBEC: 40.0000 mg | DELAYED_RELEASE_TABLET | Freq: Every day | ORAL | 3 refills | Status: DC

## 2016-07-13 MED ORDER — CLONIDINE HCL 0.1 MG PO TABS: 0.2000 mg | ORAL_TABLET | Freq: Every day | ORAL | 0 refills | Status: DC

## 2016-07-13 MED ORDER — ATORVASTATIN CALCIUM 40 MG PO TABS: 40.0000 mg | ORAL_TABLET | Freq: Every day | ORAL | 3 refills | Status: DC

## 2016-07-13 MED ORDER — NAPROXEN 500 MG PO TABS: ORAL_TABLET | ORAL | 1 refills | Status: DC

## 2016-07-13 NOTE — Patient Instructions (Addendum)
Dyslipidemia Dyslipidemia is an imbalance of waxy, fat-like substances (lipids) in the blood. The body needs lipids in small amounts. Dyslipidemia often involves a high level of cholesterol or triglycerides, which are types of lipids. Common forms of dyslipidemia include:  High levels of bad cholesterol (LDL cholesterol). LDL is the type of cholesterol that causes fatty deposits (plaques) to build up in the blood vessels that carry blood away from your heart (arteries).  Low levels of good cholesterol (HDL cholesterol). HDL cholesterol is the type of cholesterol that protects against heart disease. High levels of HDL remove the LDL buildup from arteries.  High levels of triglycerides. Triglycerides are a fatty substance in the blood that is linked to a buildup of plaques in the arteries. You can develop dyslipidemia because of the genes you are born with (primary dyslipidemia) or changes that occur during your life (secondary dyslipidemia), or as a side effect of certain medical treatments. What are the causes? Primary dyslipidemia is caused by changes (mutations) in genes that are passed down through families (inherited). These mutations cause several types of dyslipidemia. Mutations can result in disorders that make the body produce too much LDL cholesterol or triglycerides, or not enough HDL cholesterol. These disorders may lead to heart disease, arterial disease, or stroke at an early age. Causes of secondary dyslipidemia include certain lifestyle choices and diseases that lead to dyslipidemia, such as:  Eating a diet that is high in animal fat.  Not getting enough activity or exercise (having a sedentary lifestyle).  Having diabetes, kidney disease, liver disease, or thyroid disease.  Drinking large amounts of alcohol.  Using certain types of drugs. What increases the risk? You may be at greater risk for dyslipidemia if you are an older man or if you are a woman who has gone through  menopause. Other risk factors include:  Having a family history of dyslipidemia.  Taking certain medicines, including birth control pills, steroids, some diuretics, beta-blockers, and some medicines forHIV.  Smoking cigarettes.  Eating a high-fat diet.  Drinking large amounts of alcohol.  Having certain medical conditions such as diabetes, polycystic ovary syndrome (PCOS), pregnancy, kidney disease, liver disease, or hypothyroidism.  Not exercising regularly.  Being overweight or obese with too much belly fat. What are the signs or symptoms? Dyslipidemia does not usually cause any symptoms. Very high lipid levels can cause fatty bumps under the skin (xanthomas) or a white or gray ring around the black center (pupil) of the eye. Very high triglyceride levels can cause inflammation of the pancreas (pancreatitis). How is this diagnosed? Your health care provider may diagnose dyslipidemia based on a routine blood test (fasting blood test). Because most people do not have symptoms of the condition, this blood testing (lipid profile) is done on adults age 47 and older and is repeated every 5 years. This test checks:  Total cholesterol. This is a measure of the total amount of cholesterol in your blood, including LDL cholesterol, HDL cholesterol, and triglycerides. A healthy number is below 200.  LDL cholesterol. The target number for LDL cholesterol is different for each person, depending on individual risk factors. For most people, a number below 100 is healthy. Ask your health care provider what your LDL cholesterol number should be.  HDL cholesterol. An HDL level of 60 or higher is best because it helps to protect against heart disease. A number below 68 for men or below 69 for women increases the risk for heart disease.  Triglycerides. A healthy triglyceride number  is below 150. If your lipid profile is abnormal, your health care provider may do other blood tests to get more information  about your condition. How is this treated? Treatment depends on the type of dyslipidemia that you have and your other risk factors for heart disease and stroke. Your health care provider will have a target range for your lipid levels based on this information. For many people, treatment starts with lifestyle changes, such as diet and exercise. Your health care provider may recommend that you:  Get regular exercise.  Make changes to your diet.  Quit smoking if you smoke. If diet changes and exercise do not help you reach your goals, your health care provider may also prescribe medicine to lower lipids. The most commonly prescribed type of medicine lowers your LDL cholesterol (statin drug). If you have a high triglyceride level, your provider may prescribe another type of drug (fibrate) or an omega-3 fish oil supplement, or both. Follow these instructions at home:  Take over-the-counter and prescription medicines only as told by your health care provider. This includes supplements.  Get regular exercise. Start an aerobic exercise and strength training program as told by your health care provider. Ask your health care provider what activities are safe for you. Your health care provider may recommend:  30 minutes of aerobic activity 4-6 days a week. Brisk walking is an example of aerobic activity.  Strength training 2 days a week.  Eat a healthy diet as told by your health care provider. This can help you reach and maintain a healthy weight, lower your LDL cholesterol, and raise your HDL cholesterol. It may help to work with a diet and nutrition specialist (dietitian) to make a plan that is right for you. Your dietitian or health care provider may recommend:  Limiting your calories, if you are overweight.  Eating more fruits, vegetables, whole grains, fish, and lean meats.  Limiting saturated fat, trans fat, and cholesterol.  Follow instructions from your health care provider or dietitian  about eating or drinking restrictions.  Limit alcohol intake to no more than one drink per day for nonpregnant women and two drinks per day for men. One drink equals 12 oz of beer, 5 oz of wine, or 1 oz of hard liquor.  Do not use any products that contain nicotine or tobacco, such as cigarettes and e-cigarettes. If you need help quitting, ask your health care provider.  Keep all follow-up visits as told by your health care provider. This is important. Contact a health care provider if:  You are having trouble sticking to your exercise or diet plan.  You are struggling to quit smoking or control your use of alcohol. Summary  Dyslipidemia is an imbalance of waxy, fat-like substances (lipids) in the blood. The body needs lipids in small amounts. Dyslipidemia often involves a high level of cholesterol or triglycerides, which are types of lipids.  Treatment depends on the type of dyslipidemia that you have and your other risk factors for heart disease and stroke.  For many people, treatment starts with lifestyle changes, such as diet and exercise. Your health care provider may also prescribe medicine to lower lipids. This information is not intended to replace advice given to you by your health care provider. Make sure you discuss any questions you have with your health care provider. Document Released: 07/14/2013 Document Revised: 03/05/2016 Document Reviewed: 03/05/2016 Elsevier Interactive Patient Education  2017 [place] for Gastroesophageal Reflux Disease, Adult When you have  gastroesophageal reflux disease (GERD), the foods you eat and your eating habits are very important. Choosing the right foods can help ease your discomfort. What guidelines do I need to follow?  Choose fruits, vegetables, whole grains, and low-fat dairy products.  Choose low-fat meat, fish, and poultry.  Limit fats such as oils, salad dressings, butter, nuts, and avocado.  Keep a food  diary. This helps you identify foods that cause symptoms.  Avoid foods that cause symptoms. These may be different for everyone.  Eat small meals often instead of 3 large meals a day.  Eat your meals slowly, in a place where you are relaxed.  Limit fried foods.  Cook foods using methods other than frying.  Avoid drinking alcohol.  Avoid drinking large amounts of liquids with your meals.  Avoid bending over or lying down until 2-3 hours after eating. What foods are not recommended? These are some foods and drinks that may make your symptoms worse: Vegetables  Tomatoes. Tomato juice. Tomato and spaghetti sauce. Chili peppers. Onion and garlic. Horseradish. Fruits  Oranges, grapefruit, and lemon (fruit and juice). Meats  High-fat meats, fish, and poultry. This includes hot dogs, ribs, ham, sausage, salami, and bacon. Dairy  Whole milk and chocolate milk. Sour cream. Cream. Butter. Ice cream. Cream cheese. Drinks  Coffee and tea. Bubbly (carbonated) drinks or energy drinks. Condiments  Hot sauce. Barbecue sauce. Sweets/Desserts  Chocolate and cocoa. Donuts. Peppermint and spearmint. Fats and Oils  High-fat foods. This includes [place] fries and potato chips. Other  Vinegar. Strong spices. This includes black pepper, white pepper, red pepper, cayenne, curry powder, cloves, ginger, and chili powder. The items listed above may not be a complete list of foods and drinks to avoid. Contact your dietitian for more information.  This information is not intended to replace advice given to you by your health care provider. Make sure you discuss any questions you have with your health care provider. Document Released: 01/08/2012 Document Revised: 12/15/2015 Document Reviewed: 05/13/2013 Elsevier Interactive Patient Education  2017 [employer].

## 2016-07-13 NOTE — Progress Notes (Signed)
Subjective:    Patient ID: Kara Torres, female    DOB: 16-Aug-1970, 45 y.o.   MRN: [id number] Kara Torres, a 44 year old female with a history of hypertension, dyslipidemia, and anxiety presents for a follow up of hypertension. She has been taking medications consistently. She is followed by [facility] for anxiety.  Hypertension  This is a chronic problem. The current episode started more than 1 year ago. The problem has been gradually improving since onset. Associated symptoms include anxiety. Pertinent negatives include no blurred vision, chest pain, headaches, malaise/fatigue, neck pain, orthopnea, palpitations, peripheral edema, PND, shortness of breath or sweats. There are no associated agents to hypertension. Risk factors for coronary artery disease include sedentary lifestyle and obesity. The current treatment provides mild improvement. There are no compliance problems.  There is no history of angina, kidney disease, CAD/MI, CVA, heart failure, left ventricular hypertrophy, PVD or retinopathy.  Anxiety  Presents for follow-up (Patient is followed by [facility]) visit. Symptoms include depressed mood, excessive worry and irritability. Patient reports no chest pain, palpitations or shortness of breath. The severity of symptoms is mild. The quality of sleep is good.   Compliance with medications is 76-100%.     Past Medical History:  Diagnosis Date  . Allergy   . Alpha-1-antitrypsin deficiency ([place])   . Anxiety   . Asthma   . Depression   . Liver disease    Social History   Social History Narrative  . No narrative on file   There is no immunization history on file for this patient.  . Review of Systems  Constitutional: Positive for irritability and unexpected weight change (weight gain). Negative for malaise/fatigue.  HENT: Negative.   Eyes: Negative.  Negative for blurred vision.  Respiratory: Negative.  Negative for shortness of  breath.   Cardiovascular: Negative.  Negative for chest pain, palpitations, orthopnea and PND.  Gastrointestinal: Positive for diarrhea (Hx of irritable bowel syndrome).  Endocrine: Negative.  Negative for polydipsia, polyphagia and polyuria.  Genitourinary: Negative.   Musculoskeletal: Negative.  Negative for neck pain.  Skin: Negative.   Allergic/Immunologic: Negative.  Negative for immunocompromised state.  Neurological: Negative.  Negative for headaches.  Hematological: Negative.   Psychiatric/Behavioral: Negative.        Objective:   Physical Exam  Constitutional: She is oriented to person, place, and time. She appears well-developed and well-nourished.  HENT:  Head: Normocephalic and atraumatic.  Right Ear: External ear normal.  Mouth/Throat: Oropharynx is clear and moist.  Eyes: Conjunctivae and EOM are normal. Pupils are equal, round, and reactive to light.  Neck: Normal range of motion. Neck supple.  Cardiovascular: Normal rate, regular rhythm, normal heart sounds and intact distal pulses.   Pulmonary/Chest: Effort normal and breath sounds normal.  Abdominal: Soft. Bowel sounds are normal.  Musculoskeletal: Normal range of motion.  Neurological: She is alert and oriented to person, place, and time. She has normal reflexes.  Skin: Skin is warm and dry.  Psychiatric: She has a normal mood and affect. Her behavior is normal. Judgment and thought content normal.          BP (!) 142/92 (BP Location: Left Arm, Patient Position: Sitting, Cuff Size: Normal)   Pulse (!) 102   Temp 98.7 F (37.1 C) (Oral)   Resp 16   Ht 5\' 4"  (1.626 m)   Wt 215 lb (97.5 kg)   LMP 07/01/2016   SpO2 99%   BMI 36.90 kg/m  Assessment &  Plan:  1. Essential hypertension Blood pressure is above goal on current medication regimen. Patient to follow up in 3 months.  - cloNIDine (CATAPRES) 0.1 MG tablet; Take 2 tablets (0.2 mg total) by mouth at bedtime. Unsure of strength  Dispense: 90 tablet;  Refill: 0 - COMPLETE METABOLIC PANEL WITH GFR  2. Class 2 obesity without serious comorbidity in adult, unspecified BMI, unspecified obesity type Recommend a lowfat, low carbohydrate diet divided over 5-6 small meals, increase water intake to 6-8 glasses, and 150 minutes per week of cardiovascular exercise.   - Lipid Panel; Future - HgB A1c  3. Dyslipidemia The patient is asked to make an attempt to improve diet and exercise patterns to aid in medical management of this problem. - Lipid Panel; Future - atorvastatin (LIPITOR) 40 MG tablet; Take 1 tablet (40 mg total) by mouth daily.  Dispense: 30 tablet; Refill: 3  4. Gastroesophageal reflux disease, esophagitis presence not specified - pantoprazole (PROTONIX) 40 MG tablet; Take 1 tablet (40 mg total) by mouth daily.  Dispense: 30 tablet; Refill: 3  5. Gout of multiple sites, unspecified cause, unspecified chronicity - naproxen (NAPROSYN) 500 MG tablet; TAKE 1 TABLET BY MOUTH 2 TIMES DAILY WITH A MEAL.  Dispense: 60 tablet; Refill: 1  6. Fatty liver disease, nonalcoholic Will review medical records as they become available.   7. Generalized anxiety disorder Follow up with [facility] in January as scheduled. Patient denies suicidal or homicidal intent.     RTC: 3 months for hypertension Dorena Dew, FNP

## 2016-07-18 LAB — POCT URINALYSIS DIP (DEVICE)
Bilirubin Urine: NEGATIVE
Glucose, UA: NEGATIVE mg/dL
HGB URINE DIPSTICK: NEGATIVE
Ketones, ur: NEGATIVE mg/dL
Leukocytes, UA: NEGATIVE
NITRITE: NEGATIVE
PH: 5.5 (ref 5.0–8.0)
Protein, ur: NEGATIVE mg/dL
Urobilinogen, UA: 0.2 mg/dL (ref 0.0–1.0)

## 2016-08-22 ENCOUNTER — Telehealth: Payer: Self-pay

## 2016-08-28 ENCOUNTER — Telehealth: Payer: Self-pay | Admitting: Hematology

## 2016-08-28 NOTE — Telephone Encounter (Signed)
Called patient back to follow up in regards to her phone call last week.  Patient states her orange card expired and she was seen on 07-13-16.  Patient states her bill is over $300 and she can not afford to pay for it.  Her orange card has been reinstated effective 07-23-16. / I advised patient of the financial discount program through [facility]  Advised that I would send application out in mail today.  Verified mailing address with patient. Explained application process.  Advised patient to complete application and return to address noted on file, along with copy of the bill. / Patient verbalizes understanding. /

## 2016-09-14 ENCOUNTER — Other Ambulatory Visit: Payer: Self-pay | Admitting: Family Medicine

## 2016-09-14 ENCOUNTER — Other Ambulatory Visit: Payer: Self-pay

## 2016-09-14 DIAGNOSIS — E785 Hyperlipidemia, unspecified: Secondary | ICD-10-CM

## 2016-09-14 DIAGNOSIS — I10 Essential (primary) hypertension: Secondary | ICD-10-CM

## 2016-09-14 DIAGNOSIS — K219 Gastro-esophageal reflux disease without esophagitis: Secondary | ICD-10-CM

## 2016-09-14 DIAGNOSIS — F4323 Adjustment disorder with mixed anxiety and depressed mood: Secondary | ICD-10-CM

## 2016-09-14 DIAGNOSIS — M109 Gout, unspecified: Secondary | ICD-10-CM

## 2016-09-14 DIAGNOSIS — J301 Allergic rhinitis due to pollen: Secondary | ICD-10-CM

## 2016-09-14 MED ORDER — NAPROXEN 500 MG PO TABS: ORAL_TABLET | ORAL | 1 refills | Status: DC

## 2016-09-14 MED ORDER — NAPROXEN 500 MG PO TABS: ORAL_TABLET | ORAL | 0 refills | Status: DC

## 2016-09-14 MED ORDER — ATORVASTATIN CALCIUM 40 MG PO TABS: 40.0000 mg | ORAL_TABLET | Freq: Every day | ORAL | 3 refills | Status: DC

## 2016-09-14 MED ORDER — FLUOXETINE HCL 40 MG PO CAPS: 40.0000 mg | ORAL_CAPSULE | Freq: Every day | ORAL | 3 refills | Status: DC

## 2016-09-14 MED ORDER — CLONIDINE HCL 0.1 MG PO TABS: 0.2000 mg | ORAL_TABLET | Freq: Every day | ORAL | 0 refills | Status: DC

## 2016-09-14 MED ORDER — PANTOPRAZOLE SODIUM 40 MG PO TBEC: 40.0000 mg | DELAYED_RELEASE_TABLET | Freq: Every day | ORAL | 0 refills | Status: DC

## 2016-09-14 MED ORDER — FLUTICASONE PROPIONATE 50 MCG/ACT NA SUSP: 2.0000 | Freq: Two times a day (BID) | NASAL | 0 refills | Status: DC

## 2016-09-14 MED ORDER — FLUTICASONE PROPIONATE 50 MCG/ACT NA SUSP: 2.0000 | Freq: Two times a day (BID) | NASAL | 2 refills | Status: DC

## 2016-09-14 MED ORDER — CLONIDINE HCL 0.1 MG PO TABS: 0.2000 mg | ORAL_TABLET | Freq: Every day | ORAL | 1 refills | Status: DC

## 2016-09-14 MED ORDER — PANTOPRAZOLE SODIUM 40 MG PO TBEC: 40.0000 mg | DELAYED_RELEASE_TABLET | Freq: Every day | ORAL | 3 refills | Status: DC

## 2016-09-14 MED ORDER — ATORVASTATIN CALCIUM 40 MG PO TABS: 40.0000 mg | ORAL_TABLET | Freq: Every day | ORAL | 0 refills | Status: DC

## 2016-10-11 ENCOUNTER — Telehealth: Payer: Self-pay

## 2016-10-11 NOTE — Telephone Encounter (Signed)
Called and spoke with Pharmacist Colletta Maryland, advised of dose that should read "2 tablets by mouth at bedtime" Thanks!

## 2016-10-12 ENCOUNTER — Ambulatory Visit (INDEPENDENT_AMBULATORY_CARE_PROVIDER_SITE_OTHER): Payer: No Typology Code available for payment source | Admitting: Family Medicine

## 2016-10-12 ENCOUNTER — Encounter: Payer: Self-pay | Admitting: Family Medicine

## 2016-10-12 DIAGNOSIS — K219 Gastro-esophageal reflux disease without esophagitis: Secondary | ICD-10-CM

## 2016-10-12 DIAGNOSIS — E785 Hyperlipidemia, unspecified: Secondary | ICD-10-CM

## 2016-10-12 DIAGNOSIS — R1011 Right upper quadrant pain: Secondary | ICD-10-CM

## 2016-10-12 DIAGNOSIS — F4323 Adjustment disorder with mixed anxiety and depressed mood: Secondary | ICD-10-CM

## 2016-10-12 DIAGNOSIS — J301 Allergic rhinitis due to pollen: Secondary | ICD-10-CM

## 2016-10-12 DIAGNOSIS — I10 Essential (primary) hypertension: Secondary | ICD-10-CM

## 2016-10-12 MED ORDER — DESLORATADINE 5 MG PO TABS: 5.0000 mg | ORAL_TABLET | Freq: Every day | ORAL | 0 refills | Status: DC

## 2016-10-12 MED ORDER — NAPROXEN 500 MG PO TABS: ORAL_TABLET | ORAL | 0 refills | Status: DC

## 2016-10-12 MED ORDER — CLONIDINE HCL 0.1 MG PO TABS: 0.2000 mg | ORAL_TABLET | Freq: Every day | ORAL | 0 refills | Status: DC

## 2016-10-12 MED ORDER — FLUOXETINE HCL 40 MG PO CAPS
40.0000 mg | ORAL_CAPSULE | Freq: Every day | ORAL | 3 refills | Status: DC
Start: 2016-10-12 — End: 2017-04-19

## 2016-10-12 MED ORDER — ATORVASTATIN CALCIUM 40 MG PO TABS: 40.0000 mg | ORAL_TABLET | Freq: Every day | ORAL | 0 refills | Status: DC

## 2016-10-12 MED ORDER — FLUTICASONE PROPIONATE 50 MCG/ACT NA SUSP: 2.0000 | Freq: Two times a day (BID) | NASAL | 2 refills | Status: DC

## 2016-10-12 MED ORDER — PANTOPRAZOLE SODIUM 40 MG PO TBEC: 40.0000 mg | DELAYED_RELEASE_TABLET | Freq: Every day | ORAL | 0 refills | Status: DC

## 2016-10-12 NOTE — Progress Notes (Signed)
Patient ID: Kara Torres, female    DOB: [birth year], 46 y.o.   MRN: [id number]  PCP: Sharon Seller, NP  Chief Complaint  Patient presents with  . Follow-up    3 MONTH  . Medication Refill    ALL/PATIENT USES A MAIL ORDER THAT SHE MAIL RX TOO    Subjective:  HPI Kara Torres is a 46 y.o. female presents for a medication follow-up and to establish care. Current medical problems include hypertension, obesity, GAD, and fatty liver disease. She is a full-time Electronics engineer. She requests 3 months of prescription printed as she is using a mail order pharmacy program. She denies any acute complaints today. Reports an isolated episode of acute right lateral thoracic like spasmatic type discomfort during a field trip today. This pain has since resolved. She recently changed her diet to incorporate much more vegetables, less carbs, and meat. Feels more bloating than normal since making this diet change.  Social History   Social History  . Marital status: Divorced    Spouse name: N/A  . Number of children: N/A  . Years of education: N/A   Occupational History  . Customer Service    Social History Main Topics  . Smoking status: Current Some Day Smoker  . Smokeless tobacco: Never Used  . Alcohol use 1.2 oz/week    2 Cans of beer per week     Comment: occ  . Drug use: No  . Sexual activity: Not on file   Other Topics Concern  . Not on file   Social History Narrative  . No narrative on file    Family History  Problem Relation Age of Onset  . Stroke Mother   . Hyperlipidemia Mother   . Diabetes Mother   . Diabetes Father   . Hypertension Father   . Hyperlipidemia Father   . Mental illness Father   . Stroke Father   . Diabetes Maternal Grandmother   . Heart disease Maternal Grandmother   . Hypertension Maternal Grandmother   . Heart attack Maternal Grandmother    Review of Systems  Constitutional: Negative.   HENT: Negative.   Eyes: Negative.    Respiratory: Negative for cough, choking, chest tightness and shortness of breath.   Cardiovascular: Negative for chest pain, palpitations and leg swelling.  Gastrointestinal: Positive for abdominal pain.       See hpi  Genitourinary: Negative.   Musculoskeletal: Negative.   Neurological: Negative for headaches.  Psychiatric/Behavioral: Negative for behavioral problems, self-injury, sleep disturbance and suicidal ideas. The patient is not nervous/anxious.    See HPI Patient Active Problem List   Diagnosis Date Noted  . Essential hypertension 07/13/2016  . Obesity 07/13/2016  . Dyslipidemia 07/13/2016  . Fatty liver disease, nonalcoholic 78/29/5621  . Generalized anxiety disorder 07/13/2016    Allergies  Allergen Reactions  . Epinephrine   . Hydrocodone Rash    Prior to Admission medications   Medication Sig Start Date End Date Taking? Authorizing Provider  atorvastatin (LIPITOR) 40 MG tablet Take 1 tablet (40 mg total) by mouth daily. 09/14/16  Yes Dorena Dew, FNP  atorvastatin (LIPITOR) 40 MG tablet Take 1 tablet (40 mg total) by mouth daily. 09/14/16  Yes Dorena Dew, FNP  clonazePAM (KLONOPIN) 1 MG tablet Take 1 mg by mouth 2 (two) times daily as needed for anxiety.   Yes Historical Provider, MD  cloNIDine (CATAPRES) 0.1 MG tablet Take 2 tablets (0.2 mg total) by mouth at bedtime. Unsure of  strength 09/14/16  Yes Dorena Dew, FNP  cloNIDine (CATAPRES) 0.1 MG tablet Take 2 tablets (0.2 mg total) by mouth at bedtime. Unsure of strength 09/14/16  Yes Dorena Dew, FNP  desloratadine (CLARINEX) 5 MG tablet Take 5 mg by mouth daily.   Yes Historical Provider, MD  diphenhydrAMINE (SOMINEX) 25 MG tablet Take 25 mg by mouth at bedtime. Reported on 01/26/2016   Yes Historical Provider, MD  ferrous sulfate 325 (65 FE) MG EC tablet Take 1 tablet (325 mg total) by mouth 3 (three) times daily with meals. 01/04/15  Yes Lafayette Dragon, MD  FLUoxetine (PROZAC) 40 MG capsule Take 1  capsule (40 mg total) by mouth daily. 09/14/16  Yes Dorena Dew, FNP  fluticasone (FLONASE) 50 MCG/ACT nasal spray Place 2 sprays into both nostrils 2 (two) times daily. 09/14/16  Yes Dorena Dew, FNP  fluticasone (FLONASE) 50 MCG/ACT nasal spray Place 2 sprays into both nostrils 2 (two) times daily. 09/14/16  Yes Dorena Dew, FNP  Multiple Vitamins-Minerals (AIRBORNE PO) Take 2 tablets by mouth daily. Reported on 01/26/2016   Yes Historical Provider, MD  naproxen (NAPROSYN) 500 MG tablet TAKE 1 TABLET BY MOUTH 2 TIMES DAILY WITH A MEAL. 09/14/16  Yes Dorena Dew, FNP  naproxen (NAPROSYN) 500 MG tablet TAKE 1 TABLET BY MOUTH 2 TIMES DAILY WITH A MEAL. 09/14/16  Yes Dorena Dew, FNP  pantoprazole (PROTONIX) 40 MG tablet Take 1 tablet (40 mg total) by mouth daily. 09/14/16  Yes Dorena Dew, FNP  pantoprazole (PROTONIX) 40 MG tablet Take 1 tablet (40 mg total) by mouth daily. 09/14/16  Yes Dorena Dew, FNP  Past Medical, Surgical Family and Social History reviewed and updated.  Objective:   Today's Vitals   10/12/16 1507  BP: 126/78  Pulse: 80  Resp: 18  Temp: 98.6 F (37 C)  TempSrc: Oral  SpO2: 99%  Weight: 224 lb (101.6 kg)  Height: 5\' 4"  (1.626 m)    Wt Readings from Last 3 Encounters:  10/12/16 224 lb (101.6 kg)  07/13/16 215 lb (97.5 kg)  03/16/16 222 lb (100.7 kg)   Physical Exam  Constitutional: She is oriented to person, place, and time. She appears well-developed and well-nourished.  HENT:  Head: Normocephalic.  Neck: Normal range of motion. Neck supple. No thyromegaly present.  Cardiovascular: Normal rate, regular rhythm, normal heart sounds and intact distal pulses.   Pulmonary/Chest: Effort normal and breath sounds normal.  Abdominal: Soft. Bowel sounds are normal.  Lymphadenopathy:    She has no cervical adenopathy.  Neurological: She is alert and oriented to person, place, and time.  Skin: Skin is warm and dry.  Psychiatric: She has a  normal mood and affect. Her behavior is normal. Judgment and thought content normal.      Assessment & Plan:  1. Adjustment disorder with mixed anxiety and depressed mood - FLUoxetine (PROZAC) 40 MG capsule; Take 1 capsule (40 mg total) by mouth daily.    2. Chronic seasonal allergic rhinitis due to pollen - fluticasone (FLONASE) 50 MCG/ACT nasal spray; Place 2 sprays into both nostrils 2 (two) times daily.   3. Dyslipidemia - atorvastatin (LIPITOR) 40 MG tablet; Take 1 tablet (40 mg total) by mouth daily.    4. Essential hypertension - cloNIDine (CATAPRES) 0.1 MG tablet; Take 2 tablets (0.2 mg total) by mouth at bedtime.   5. Gastroesophageal reflux disease, esophagitis presence not specified - pantoprazole (PROTONIX) 40 MG tablet; Take 1  tablet (40 mg total) by mouth daily.    6. Abdominal pain, now resolved.  -If reoccurs or worsens, return for care and evaluation.   Return in 6 months for follow-up.  Carroll Sage. Kenton Kingfisher, MSN, [facility] [address] [place], [place] [address] [phone]

## 2016-11-02 ENCOUNTER — Other Ambulatory Visit (INDEPENDENT_AMBULATORY_CARE_PROVIDER_SITE_OTHER): Payer: No Typology Code available for payment source

## 2016-11-02 DIAGNOSIS — E785 Hyperlipidemia, unspecified: Secondary | ICD-10-CM

## 2016-11-02 DIAGNOSIS — E669 Obesity, unspecified: Secondary | ICD-10-CM

## 2016-11-02 LAB — LIPID PANEL
CHOL/HDL RATIO: 3.8 ratio (ref <5.0)
CHOLESTEROL: 186 mg/dL (ref <200)
HDL: 49 mg/dL — ABNORMAL LOW (ref >50)
LDL CALC: 111 mg/dL — AB (ref <100)
TRIGLYCERIDES: 128 mg/dL (ref <150)
VLDL: 26 mg/dL (ref <30)

## 2016-11-09 ENCOUNTER — Telehealth: Payer: Self-pay | Admitting: Family Medicine

## 2016-11-09 DIAGNOSIS — K219 Gastro-esophageal reflux disease without esophagitis: Secondary | ICD-10-CM

## 2016-11-09 NOTE — Telephone Encounter (Signed)
[place], this patient has a medication assist program that doesn't cover Protonix any longer, but they will cover Omeprazole. Would you order her a new rx for this? If so I will mail it to her because it needs to be mailed.thanks!

## 2016-11-12 MED ORDER — OMEPRAZOLE 20 MG PO CPDR: 40.0000 mg | DELAYED_RELEASE_CAPSULE | Freq: Every day | ORAL | 3 refills | Status: DC

## 2016-11-12 NOTE — Addendum Note (Signed)
Addended by: Scot Jun on: 11/12/2016 01:07 PM   Modules accepted: Orders

## 2016-11-12 NOTE — Telephone Encounter (Signed)
Prescription printed and given to Mickel Baas, LPN to mail to patient.

## 2017-01-03 ENCOUNTER — Other Ambulatory Visit: Payer: Self-pay | Admitting: Family Medicine

## 2017-01-03 DIAGNOSIS — E785 Hyperlipidemia, unspecified: Secondary | ICD-10-CM

## 2017-02-06 ENCOUNTER — Ambulatory Visit: Payer: No Typology Code available for payment source | Admitting: Family Medicine

## 2017-02-19 ENCOUNTER — Other Ambulatory Visit: Payer: Self-pay | Admitting: Family Medicine

## 2017-02-19 DIAGNOSIS — R1011 Right upper quadrant pain: Secondary | ICD-10-CM

## 2017-03-18 ENCOUNTER — Other Ambulatory Visit: Payer: Self-pay | Admitting: Family Medicine

## 2017-03-18 DIAGNOSIS — E785 Hyperlipidemia, unspecified: Secondary | ICD-10-CM

## 2017-04-19 ENCOUNTER — Ambulatory Visit (INDEPENDENT_AMBULATORY_CARE_PROVIDER_SITE_OTHER): Payer: No Typology Code available for payment source | Admitting: Family Medicine

## 2017-04-19 ENCOUNTER — Encounter: Payer: Self-pay | Admitting: Family Medicine

## 2017-04-19 VITALS — BP 125/76 | HR 76 | Temp 97.8°F | Resp 16 | Ht 64.0 in | Wt 230.0 lb

## 2017-04-19 DIAGNOSIS — F4323 Adjustment disorder with mixed anxiety and depressed mood: Secondary | ICD-10-CM

## 2017-04-19 DIAGNOSIS — R400 Somnolence: Secondary | ICD-10-CM

## 2017-04-19 DIAGNOSIS — L0292 Furuncle, unspecified: Secondary | ICD-10-CM

## 2017-04-19 DIAGNOSIS — Z131 Encounter for screening for diabetes mellitus: Secondary | ICD-10-CM

## 2017-04-19 DIAGNOSIS — J301 Allergic rhinitis due to pollen: Secondary | ICD-10-CM

## 2017-04-19 DIAGNOSIS — I10 Essential (primary) hypertension: Secondary | ICD-10-CM

## 2017-04-19 DIAGNOSIS — K219 Gastro-esophageal reflux disease without esophagitis: Secondary | ICD-10-CM

## 2017-04-19 DIAGNOSIS — E785 Hyperlipidemia, unspecified: Secondary | ICD-10-CM

## 2017-04-19 MED ORDER — CEPHALEXIN 500 MG PO CAPS: 500.0000 mg | ORAL_CAPSULE | Freq: Three times a day (TID) | ORAL | 0 refills | Status: DC

## 2017-04-19 MED ORDER — POTASSIUM CHLORIDE ER 10 MEQ PO TBCR: 10.0000 meq | EXTENDED_RELEASE_TABLET | Freq: Every day | ORAL | 0 refills | Status: DC

## 2017-04-19 MED ORDER — CLONIDINE HCL 0.1 MG PO TABS: 0.2000 mg | ORAL_TABLET | Freq: Every day | ORAL | 0 refills | Status: DC

## 2017-04-19 MED ORDER — ATORVASTATIN CALCIUM 40 MG PO TABS: 40.0000 mg | ORAL_TABLET | Freq: Every day | ORAL | 0 refills | Status: DC

## 2017-04-19 MED ORDER — FLUCONAZOLE 150 MG PO TABS: 150.0000 mg | ORAL_TABLET | Freq: Once | ORAL | 0 refills | Status: AC

## 2017-04-19 MED ORDER — OMEPRAZOLE 40 MG PO CPDR: 40.0000 mg | DELAYED_RELEASE_CAPSULE | Freq: Every day | ORAL | 3 refills | Status: DC

## 2017-04-19 MED ORDER — NAPROXEN 500 MG PO TABS: ORAL_TABLET | ORAL | 0 refills | Status: DC

## 2017-04-19 MED ORDER — FLUTICASONE PROPIONATE 50 MCG/ACT NA SUSP: 2.0000 | Freq: Two times a day (BID) | NASAL | 2 refills | Status: DC

## 2017-04-19 MED ORDER — FLUOXETINE HCL 40 MG PO CAPS: 40.0000 mg | ORAL_CAPSULE | Freq: Every day | ORAL | 3 refills | Status: DC

## 2017-04-19 MED ORDER — FUROSEMIDE 20 MG PO TABS: 20.0000 mg | ORAL_TABLET | Freq: Every day | ORAL | 3 refills | Status: DC | PRN

## 2017-04-19 NOTE — Progress Notes (Signed)
Patient ID: Kara Torres, female    DOB: 1971/05/07, 46 y.o.   MRN: [id number]  PCP: Scot Jun, FNP  Chief Complaint  Patient presents with  . Follow-up    6 month    Subjective:  HPI Krista A Saidi is a 46 y.o. female presents for a 6 month follow-up of chronic disease management. Medical problems significant for hypertension, fatty liver disease, obesity, GAD, dyslipidemia. [name] reports no routine physical activity due to work schedule involves 6-7 day work week as she is employed with both a full-time and part-time job.  [name] complains of few months of worsening fatigue. She reports fatigue in spite of nighttime sleep. Even with quality rest she remains sleepy throughout the day. Risk factors for sleep apnea include obesity, current smoking, and snoring. Current Body mass index is 39.48 kg/m. [name] also has developed lower extremity swelling. She has attempted relief with elevating her legs which temporarily reduces swelling. [name] complains of skin "boils" bilateral groin region. Boils are fluid filled, tenderness localized around the boarder. These lesions are not present anywhere else on the body. She denies any recent internal vaginal irritation or discharge. Social History   Social History  . Marital status: Divorced    Spouse name: N/A  . Number of children: N/A  . Years of education: N/A   Occupational History  . Customer Service    Social History Main Topics  . Smoking status: Current Some Day Smoker  . Smokeless tobacco: Never Used  . Alcohol use 1.2 oz/week    2 Cans of beer per week     Comment: occ  . Drug use: No  . Sexual activity: Not on file   Other Topics Concern  . Not on file   Social History Narrative  . No narrative on file    Family History  Problem Relation Age of Onset  . Stroke Mother   . Hyperlipidemia Mother   . Diabetes Mother   . Diabetes Father   . Hypertension Father   . Hyperlipidemia Father   . Mental  illness Father   . Stroke Father   . Diabetes Maternal Grandmother   . Heart disease Maternal Grandmother   . Hypertension Maternal Grandmother   . Heart attack Maternal Grandmother    Review of Systems  Constitutional: Positive for fatigue. Negative for activity change, appetite change and unexpected weight change.  HENT: Negative.   Eyes: Negative.   Respiratory: Negative.   Cardiovascular: Negative.   Gastrointestinal: Negative.   Endocrine: Negative.   Genitourinary: Positive for frequency.  Skin:       Bilateral groin boils   Allergic/Immunologic: Negative.   Neurological: Negative for dizziness, weakness and headaches.  Hematological: Negative.   Psychiatric/Behavioral: Negative for dysphoric mood and suicidal ideas. The patient is not nervous/anxious.        Symptoms controlled with current medication regimen    Patient Active Problem List   Diagnosis Date Noted  . Essential hypertension 07/13/2016  . Obesity 07/13/2016  . Dyslipidemia 07/13/2016  . Fatty liver disease, nonalcoholic 23/55/7322  . Generalized anxiety disorder 07/13/2016    Allergies  Allergen Reactions  . Epinephrine   . Hydrocodone Rash    Prior to Admission medications   Medication Sig Start Date End Date Taking? Authorizing Provider  atorvastatin (LIPITOR) 40 MG tablet TAKE 1 Tablet BY MOUTH ONCE DAILY 03/18/17  Yes Scot Jun, FNP  clonazePAM (KLONOPIN) 1 MG tablet Take 1 mg by mouth 2 (two) times  daily as needed for anxiety.   Yes [provider]  cloNIDine (CATAPRES) 0.1 MG tablet Take 2 tablets (0.2 mg total) by mouth at bedtime. Unsure of strength 10/12/16  Yes Scot Jun, FNP  desloratadine (CLARINEX) 5 MG tablet Take 1 tablet (5 mg total) by mouth daily. 10/12/16  Yes Scot Jun, FNP  diphenhydrAMINE (SOMINEX) 25 MG tablet Take 25 mg by mouth at bedtime. Reported on 01/26/2016   Yes [provider]  ferrous sulfate 325 (65 FE) MG EC tablet Take 1  tablet (325 mg total) by mouth 3 (three) times daily with meals. 01/04/15  Yes Lafayette Dragon, MD  FLUoxetine (PROZAC) 40 MG capsule Take 1 capsule (40 mg total) by mouth daily. 10/12/16  Yes Scot Jun, FNP  fluticasone (FLONASE) 50 MCG/ACT nasal spray Place 2 sprays into both nostrils 2 (two) times daily. 10/12/16  Yes Scot Jun, FNP  Multiple Vitamins-Minerals (AIRBORNE PO) Take 2 tablets by mouth daily. Reported on 01/26/2016   Yes [provider]  naproxen (NAPROSYN) 500 MG tablet TAKE 1 Tablet  BY MOUTH TWICE DAILY WITH A MEAL 02/19/17  Yes Scot Jun, FNP  omeprazole (PRILOSEC) 20 MG capsule Take 2 capsules (40 mg total) by mouth daily. 11/12/16  Yes Scot Jun, FNP  omeprazole (PRILOSEC) 40 MG capsule TAKE 1 Capsule BY MOUTH ONCE DAILY Patient not taking: Reported on 04/19/2017 02/19/17   Scot Jun, FNP    Past Medical, Surgical Family and Social History reviewed and updated.    Objective:   Today's Vitals   04/19/17 1429  BP: 125/76  Pulse: 76  Resp: 16  Temp: 97.8 F (36.6 C)  TempSrc: Oral  SpO2: 98%  Weight: 230 lb (104.3 kg)  Height: 5\' 4"  (1.626 m)    Wt Readings from Last 3 Encounters:  04/19/17 230 lb (104.3 kg)  10/12/16 224 lb (101.6 kg)  07/13/16 215 lb (97.5 kg)   Physical Exam  Constitutional: She is oriented to person, place, and time. She appears well-developed and well-nourished.  HENT:  Head: Normocephalic and atraumatic.  Eyes: Pupils are equal, round, and reactive to light. Conjunctivae and EOM are normal. No scleral icterus.  Neck: Normal range of motion. Neck supple. No thyromegaly present.  Cardiovascular: Normal rate, regular rhythm, normal heart sounds and intact distal pulses.   Pulmonary/Chest: Effort normal and breath sounds normal.  Abdominal: Soft. Bowel sounds are normal. She exhibits no distension and no mass. There is no tenderness. There is no rebound.  Musculoskeletal: Normal range of motion.   Lymphadenopathy:    She has no cervical adenopathy.  Neurological: She is alert and oriented to person, place, and time.  Skin: Skin is warm and dry.  Psychiatric: She has a normal mood and affect. Her behavior is normal. Judgment and thought content normal.   Assessment & Plan:  1. Essential hypertension, controlled, at goal. -Continue Clonidine 0.2 mg at bedtime once daily.   2. Chronic seasonal allergic rhinitis due to pollen -Continue desloratadine (Clarinex) 5 mg at daily  3. Acid Reflux, continue omeprazole. 4. Adjustment disorder with mixed anxiety and depressed mood -Continue Prozac 5. Dyslipidemia -Continue Atorvastatin 6. Daytime sleepiness, checking CBC to rule out anemia, TSH rule out hypothyroidism . Epworth sleepiness scale is 14, abnormal. Patient would benefit from split sleep study. She is uninsured and only has [employer] to assist with healthcare expenses. Patient given a [place] Patient Assistance Application. 7. Boils, no evidence of  cellulitis. Non-fluctuant, unable to obtain would culture. Start Keflex 500 mg, 3 times daily. 8. Screening for diabetes mellitus, A1C pending   -Patient completing [place] Patient Assistance Application and will notify me once approved to have sleep study order placed.  RTC: 6 months for chronic disease management   Carroll Sage. Kenton Kingfisher, MSN, FNP-C [facility]  [address] [place], [place] [address] [phone]

## 2017-04-20 LAB — COMPLETE METABOLIC PANEL WITH GFR
AG Ratio: 1.1 (calc) (ref 1.0–2.5)
ALBUMIN MSPROF: 3.6 g/dL (ref 3.6–5.1)
ALKALINE PHOSPHATASE (APISO): 79 U/L (ref 33–115)
ALT: 63 U/L — ABNORMAL HIGH (ref 6–29)
AST: 74 U/L — ABNORMAL HIGH (ref 10–30)
BILIRUBIN TOTAL: 0.3 mg/dL (ref 0.2–1.2)
BUN: 11 mg/dL (ref 7–25)
CHLORIDE: 105 mmol/L (ref 98–110)
CO2: 25 mmol/L (ref 20–32)
CREATININE: 0.8 mg/dL (ref 0.50–1.10)
Calcium: 8.7 mg/dL (ref 8.6–10.2)
GFR, EST AFRICAN AMERICAN: 105 mL/min/{1.73_m2} (ref >=60)
GFR, Est Non African American: 90 mL/min/{1.73_m2} (ref >=60)
GLUCOSE: 98 mg/dL (ref 65–99)
Globulin: 3.3 g/dL (calc) (ref 1.9–3.7)
Potassium: 4.5 mmol/L (ref 3.5–5.3)
Sodium: 139 mmol/L (ref 135–146)
TOTAL PROTEIN: 6.9 g/dL (ref 6.1–8.1)

## 2017-04-20 LAB — CBC WITH DIFFERENTIAL/PLATELET
Basophils Absolute: 41 cells/uL (ref 0–200)
Basophils Relative: 0.7 %
EOS PCT: 3.7 %
Eosinophils Absolute: 218 cells/uL (ref 15–500)
HEMATOCRIT: 32.1 % — AB (ref 35.0–45.0)
HEMOGLOBIN: 10.4 g/dL — AB (ref 11.7–15.5)
LYMPHS ABS: 1575 {cells}/uL (ref 850–3900)
MCH: 26.6 pg — ABNORMAL LOW (ref 27.0–33.0)
MCHC: 32.4 g/dL (ref 32.0–36.0)
MCV: 82.1 fL (ref 80.0–100.0)
MPV: 9.4 fL (ref 7.5–12.5)
Monocytes Relative: 7.6 %
NEUTROS ABS: 3617 {cells}/uL (ref 1500–7800)
Neutrophils Relative %: 61.3 %
Platelets: 190 10*3/uL (ref 140–400)
RBC: 3.91 10*6/uL (ref 3.80–5.10)
RDW: 14.7 % (ref 11.0–15.0)
Total Lymphocyte: 26.7 %
WBC: 5.9 10*3/uL (ref 3.8–10.8)
WBCMIX: 448 {cells}/uL (ref 200–950)

## 2017-04-20 LAB — HEMOGLOBIN A1C
Hgb A1c MFr Bld: 5.5 % of total Hgb (ref <5.7)
Mean Plasma Glucose: 111 (calc)
eAG (mmol/L): 6.2 (calc)

## 2017-04-20 LAB — THYROID PANEL WITH TSH
FREE THYROXINE INDEX: 2.2 (ref 1.4–3.8)
T3 Uptake: 28 % (ref 22–35)
T4, Total: 7.8 ug/dL (ref 5.1–11.9)
TSH: 3.94 m[IU]/L

## 2017-04-22 ENCOUNTER — Telehealth: Payer: Self-pay | Admitting: Family Medicine

## 2017-04-22 LAB — POCT URINALYSIS DIP (DEVICE)
BILIRUBIN URINE: NEGATIVE
GLUCOSE, UA: NEGATIVE mg/dL
Hgb urine dipstick: NEGATIVE
Ketones, ur: NEGATIVE mg/dL
Leukocytes, UA: NEGATIVE
NITRITE: NEGATIVE
PH: 6 (ref 5.0–8.0)
Protein, ur: NEGATIVE mg/dL
Specific Gravity, Urine: <=1.005 (ref 1.005–1.030)
Urobilinogen, UA: 0.2 mg/dL (ref 0.0–1.0)

## 2017-04-22 NOTE — Telephone Encounter (Signed)
Patient notified of results and will call once approved for assistance

## 2017-04-22 NOTE — Telephone Encounter (Signed)
Advise patient that all labs are within expected range. I would like to proceed with ordering a sleep study. She needs to complete the financial assistance application and submit. Once approved, she must notify me here at the office and I will proceed with the ordering the study.

## 2017-04-28 DIAGNOSIS — R400 Somnolence: Secondary | ICD-10-CM | POA: Insufficient documentation

## 2017-04-28 HISTORY — DX: Somnolence: R40.0

## 2017-05-14 ENCOUNTER — Other Ambulatory Visit: Payer: Self-pay | Admitting: Family Medicine

## 2017-07-04 ENCOUNTER — Ambulatory Visit: Payer: No Typology Code available for payment source | Admitting: Family Medicine

## 2017-07-09 ENCOUNTER — Encounter: Payer: Self-pay | Admitting: Family Medicine

## 2017-07-09 ENCOUNTER — Ambulatory Visit: Payer: No Typology Code available for payment source | Admitting: Family Medicine

## 2017-07-09 VITALS — BP 102/78 | HR 97 | Temp 98.6°F | Resp 14 | Ht 64.0 in | Wt 224.0 lb

## 2017-07-09 DIAGNOSIS — R109 Unspecified abdominal pain: Secondary | ICD-10-CM

## 2017-07-09 DIAGNOSIS — N898 Other specified noninflammatory disorders of vagina: Secondary | ICD-10-CM

## 2017-07-09 DIAGNOSIS — R748 Abnormal levels of other serum enzymes: Secondary | ICD-10-CM

## 2017-07-09 LAB — POCT URINALYSIS DIP (DEVICE)
BILIRUBIN URINE: NEGATIVE
Glucose, UA: NEGATIVE mg/dL
Hgb urine dipstick: NEGATIVE
Ketones, ur: NEGATIVE mg/dL
LEUKOCYTES UA: NEGATIVE
NITRITE: NEGATIVE
Protein, ur: NEGATIVE mg/dL
Specific Gravity, Urine: >=1.03 (ref 1.005–1.030)
Urobilinogen, UA: 0.2 mg/dL (ref 0.0–1.0)
pH: 5 (ref 5.0–8.0)

## 2017-07-09 MED ORDER — TRAMADOL HCL 50 MG PO TABS: 50.0000 mg | ORAL_TABLET | Freq: Four times a day (QID) | ORAL | 0 refills | Status: DC | PRN

## 2017-07-09 MED ORDER — TERCONAZOLE 0.8 % VA CREA: 1.0000 | TOPICAL_CREAM | Freq: Every day | VAGINAL | 0 refills | Status: AC

## 2017-07-09 NOTE — Progress Notes (Signed)
Patient ID: [name], female    DOB: Aug 05, 1970, 46 y.o.   MRN: [id number]  PCP: Scot Jun, FNP  Chief Complaint  Patient presents with  . Abdominal Pain    for 3 months    Subjective:  HPI Kara Torres is a 46 y.o. female presents for evaluation of ongoing abdominal pain. Kara Torres reports worsening left lower quadrant pain over the last two weeks.  She describes the pain as 10 out of 10 at worst and pain is characterized as sharp to a deep aching type pain.  Pain is localized immediately below the left breast.  She reports associated bowel movements which are mostly loose and runny, occasional nausea with occasional vomiting.  Pain is not associated with or without food.  Problem has been intermittent since April.  History is significant for fatty liver disease with chronically elevated LF. Denies dark tarry stools, rectal bleeding, or prior diagnosis of splenomegaly. She has previously been referred to gastroenterology for evaluation and management of fatty liver disease in 2016  although has remained reluctant to return secondary to cost. Last CT of abdomen in 2016 was significant for "high risk fibrosis" and impression was consistent with coarse liver echotexture with increased echogenicity but no focal lesions. Kara Torres also complains of vaginal itching. She denies discharge, new sexual partners,  vaginal lesion or recent antibiotic therapy. Kara Torres denies headaches, chest pain, shortness of breath, or weakness. Social History   Socioeconomic History  . Marital status: Divorced    Spouse name: Not on file  . Number of children: Not on file  . Years of education: Not on file  . Highest education level: Not on file  Social Needs  . Financial resource strain: Not on file  . Food insecurity - worry: Not on file  . Food insecurity - inability: Not on file  . Transportation needs - medical: Not on file  . Transportation needs - non-medical: Not on file  Occupational  History  . Occupation: Therapist, art  Tobacco Use  . Smoking status: Current Some Day Smoker  . Smokeless tobacco: Never Used  Substance and Sexual Activity  . Alcohol use: Yes    Alcohol/week: 1.2 oz    Types: 2 Cans of beer per week    Comment: occ  . Drug use: No  . Sexual activity: Not on file  Other Topics Concern  . Not on file  Social History Narrative  . Not on file    Family History  Problem Relation Age of Onset  . Stroke Mother   . Hyperlipidemia Mother   . Diabetes Mother   . Diabetes Father   . Hypertension Father   . Hyperlipidemia Father   . Mental illness Father   . Stroke Father   . Diabetes Maternal Grandmother   . Heart disease Maternal Grandmother   . Hypertension Maternal Grandmother   . Heart attack Maternal Grandmother    Review of Systems  Constitutional: Positive for fatigue.  Respiratory: Negative.   Cardiovascular: Negative.   Gastrointestinal: Positive for abdominal pain, diarrhea and nausea. Negative for abdominal distention, blood in stool and constipation.  Endocrine: Negative.   Neurological: Negative for dizziness and headaches.  Psychiatric/Behavioral: Negative.     Patient Active Problem List   Diagnosis Date Noted  . Daytime sleepiness 04/28/2017  . Essential hypertension 07/13/2016  . Obesity 07/13/2016  . Dyslipidemia 07/13/2016  . Fatty liver disease, nonalcoholic 83/66/2947  . Generalized anxiety disorder 07/13/2016    Allergies  Allergen Reactions  . Epinephrine   . Hydrocodone Rash    Prior to Admission medications   Medication Sig Start Date End Date Taking? Authorizing Provider  atorvastatin (LIPITOR) 40 MG tablet Take 1 tablet (40 mg total) by mouth daily. 04/19/17  Yes Scot Jun, FNP  cloNIDine (CATAPRES) 0.1 MG tablet Take 2 tablets (0.2 mg total) by mouth at bedtime. Unsure of strength 04/19/17  Yes Scot Jun, FNP  diphenhydrAMINE (SOMINEX) 25 MG tablet Take 25 mg by mouth at bedtime.  Reported on 01/26/2016   Yes [provider]  FLUoxetine (PROZAC) 40 MG capsule Take 1 capsule (40 mg total) by mouth daily. Patient taking differently: Take 20 mg by mouth daily.  04/19/17  Yes Scot Jun, FNP  fluticasone (FLONASE) 50 MCG/ACT nasal spray Place 2 sprays into both nostrils 2 (two) times daily. 04/19/17  Yes Scot Jun, FNP  naproxen (NAPROSYN) 500 MG tablet TAKE 1 Tablet  BY MOUTH TWICE DAILY WITH A MEAL 04/19/17  Yes Scot Jun, FNP  omeprazole (PRILOSEC) 20 MG capsule Take 2 capsules (40 mg total) by mouth daily. 11/12/16  Yes Scot Jun, FNP  clonazePAM (KLONOPIN) 1 MG tablet Take 1 mg by mouth 2 (two) times daily as needed for anxiety.    [provider]  furosemide (LASIX) 20 MG tablet Take 1 tablet (20 mg total) by mouth daily as needed. Patient not taking: Reported on 07/09/2017 04/19/17   Scot Jun, FNP  potassium chloride (K-DUR) 10 MEQ tablet TAKE 1 Tablet BY MOUTH ONCE DAILY Patient not taking: Reported on 07/09/2017 05/14/17   Scot Jun, FNP    Past Medical, Surgical Family and Social History reviewed and updated.    Objective:   Today's Vitals   07/09/17 1020  BP: 102/78  Pulse: 97  Resp: 14  Temp: 98.6 F (37 C)  TempSrc: Oral  SpO2: 96%  Weight: 224 lb (101.6 kg)  Height: 5\' 4"  (1.626 m)    Wt Readings from Last 3 Encounters:  07/09/17 224 lb (101.6 kg)  04/19/17 230 lb (104.3 kg)  10/12/16 224 lb (101.6 kg)   Physical Exam  Constitutional: She is oriented to person, place, and time. She appears well-developed and well-nourished.  HENT:  Head: Normocephalic.  Eyes: Pupils are equal, round, and reactive to light.  Neck: Normal range of motion. Neck supple.  Cardiovascular: Normal rate, regular rhythm, normal heart sounds and intact distal pulses.  Pulmonary/Chest: Effort normal and breath sounds normal.  Abdominal: Soft. Normal appearance and bowel sounds are normal. There is  tenderness in the left lower quadrant. There is no CVA tenderness.    Lymphadenopathy:    She has no cervical adenopathy.  Neurological: She is alert and oriented to person, place, and time.  Skin: Skin is warm and dry.  Psychiatric: She has a normal mood and affect. Her behavior is normal. Judgment and thought content normal.   Assessment & Plan:  1. Left sided abdominal pain, unknown etiology. Suspect possible deferred pain form worsening liver disease. Last liver enzymes elevated although trending down from prior level. Will obtain a US abdomen, CBC, CMP. Amylase, and Lipase. GI will likely need to be consulted as abdominal pain is worsening in nature.  Patient is against specialty eval due to financial constraints.  Educated that this likely will be the alternative pending the outcome of ultrasound and labs. Will prescribed tramadol 50 mg every 6 hours as needed for pain on a limited  basis.  2. Elevated liver enzymes, last week collected 04/19/2017. AST 74 /ALT 63. Will recheck CMP today.  3. Vaginal irritation, vaginal specimen self collected and pending. Will treat symptoms consistent with vaginitis until specimen results.  terconazole (TERAZOL 3) 0.8 % vaginal cream; Place 1 applicator vaginally at bedtime for 3 days. Repeat if needed.  - Vaginitis/Vaginosis, DNA Probe pending.  -The patient was given clear instructions to go to ER or return to medical center if symptoms do not improve, worsen or new problems develop. The patient verbalized understanding.  Carroll Sage. Kenton Kingfisher, MSN, FNP-C [facility]  [address] [place], [place] [address] [phone]

## 2017-07-10 ENCOUNTER — Telehealth: Payer: Self-pay | Admitting: Family Medicine

## 2017-07-10 ENCOUNTER — Encounter: Payer: Self-pay | Admitting: Family Medicine

## 2017-07-10 DIAGNOSIS — K760 Fatty (change of) liver, not elsewhere classified: Secondary | ICD-10-CM

## 2017-07-10 DIAGNOSIS — G8929 Other chronic pain: Secondary | ICD-10-CM

## 2017-07-10 DIAGNOSIS — R1012 Left upper quadrant pain: Secondary | ICD-10-CM

## 2017-07-10 LAB — COMPREHENSIVE METABOLIC PANEL
A/G RATIO: 1.2 (ref 1.2–2.2)
ALK PHOS: 97 IU/L (ref 39–117)
ALT: 115 IU/L — ABNORMAL HIGH (ref 0–32)
AST: 123 IU/L — AB (ref 0–40)
Albumin: 4.2 g/dL (ref 3.5–5.5)
BILIRUBIN TOTAL: 0.3 mg/dL (ref 0.0–1.2)
BUN/Creatinine Ratio: 13 (ref 9–23)
BUN: 9 mg/dL (ref 6–24)
CHLORIDE: 105 mmol/L (ref 96–106)
CO2: 17 mmol/L — AB (ref 20–29)
Calcium: 8.7 mg/dL (ref 8.7–10.2)
Creatinine, Ser: 0.7 mg/dL (ref 0.57–1.00)
GFR calc Af Amer: 122 mL/min/{1.73_m2} (ref >59)
GFR calc non Af Amer: 106 mL/min/{1.73_m2} (ref >59)
GLOBULIN, TOTAL: 3.5 g/dL (ref 1.5–4.5)
Glucose: 116 mg/dL — ABNORMAL HIGH (ref 65–99)
POTASSIUM: 4.5 mmol/L (ref 3.5–5.2)
SODIUM: 140 mmol/L (ref 134–144)
Total Protein: 7.7 g/dL (ref 6.0–8.5)

## 2017-07-10 LAB — CBC
HEMATOCRIT: 34.3 % (ref 34.0–46.6)
Hemoglobin: 10.6 g/dL — ABNORMAL LOW (ref 11.1–15.9)
MCH: 25.3 pg — AB (ref 26.6–33.0)
MCHC: 30.9 g/dL — AB (ref 31.5–35.7)
MCV: 82 fL (ref 79–97)
Platelets: 232 10*3/uL (ref 150–379)
RBC: 4.19 x10E6/uL (ref 3.77–5.28)
RDW: 16.7 % — AB (ref 12.3–15.4)
WBC: 4.2 10*3/uL (ref 3.4–10.8)

## 2017-07-10 LAB — AMYLASE: AMYLASE: 69 U/L (ref 31–124)

## 2017-07-10 LAB — LIPASE: LIPASE: 56 U/L (ref 14–72)

## 2017-07-10 NOTE — Telephone Encounter (Signed)
Contact patient to advise liver enzymes are elevated more than her typical baseline elevation. Other labs unremarkable. Continue with abdominal scan and I will refer patient to gastroenterology as her persistent abdominal symptoms warrant further evaluation.

## 2017-07-10 NOTE — Telephone Encounter (Signed)
Patient notified and states that she is not able to be seen by GI because the want $110 dollars up front.

## 2017-07-10 NOTE — Telephone Encounter (Signed)
I can only recommend appropriate follow-up as patient is experiencing chronic abdominal pain and her liver enzymes are trending upwardly. Regardless of outcome of scheduled ultrasound, I highly recommend that she follow-up with GI.  She was given several months ago a [employer] financial assistance application and she was encouraged to fill out this form in order to get assistance to have proper evaluation of elevated liver enzymes.  To date she has not completed the [place] assistance application.  If ultrasound is abnormal again I will insist for her make financial arrangements to be seen by GI. However my treatment capabilities will be very limited. It is  my professional opinion and recommendation that she specialty evaluation by gastroenterology.  Referral has been placed and will be good up to 12 months.  Carroll Sage. Kenton Kingfisher, MSN, FNP-C [facility]  [address] [place] [place], [place] [address] [phone]

## 2017-07-11 ENCOUNTER — Ambulatory Visit (HOSPITAL_COMMUNITY)
Admission: RE | Admit: 2017-07-11 | Discharge: 2017-07-11 | Disposition: A | Discharge status: Home / Self Care | Payer: Self-pay | Source: Ambulatory Visit | Attending: Family Medicine | Admitting: Family Medicine

## 2017-07-11 DIAGNOSIS — R109 Unspecified abdominal pain: Secondary | ICD-10-CM | POA: Insufficient documentation

## 2017-07-11 DIAGNOSIS — R748 Abnormal levels of other serum enzymes: Secondary | ICD-10-CM | POA: Insufficient documentation

## 2017-07-11 DIAGNOSIS — Z9049 Acquired absence of other specified parts of digestive tract: Secondary | ICD-10-CM | POA: Insufficient documentation

## 2017-07-11 LAB — VAGINITIS/VAGINOSIS, DNA PROBE
CANDIDA SPECIES: NEGATIVE
GARDNERELLA VAGINALIS: POSITIVE — AB
Trichomonas vaginosis: NEGATIVE

## 2017-07-12 ENCOUNTER — Telehealth: Payer: Self-pay | Admitting: Family Medicine

## 2017-07-12 MED ORDER — METRONIDAZOLE 500 MG PO TABS: 500.0000 mg | ORAL_TABLET | Freq: Two times a day (BID) | ORAL | 0 refills | Status: AC

## 2017-07-12 NOTE — Telephone Encounter (Signed)
Patient notified and will apply for the assistance program to be seen by GI.

## 2017-07-12 NOTE — Telephone Encounter (Signed)
Patient notified and will try to get in with the GI. Patient will also fill out application for assistance

## 2017-07-12 NOTE — Telephone Encounter (Signed)
Contact patient to advise her most recent abdominal ultrasound was significant for increased fatty liver disease and it also made note of an enlarged of her spleen.  As recommended and mentioned before patient will need a consult with a gastroenterologist I recommend sooner rather than later.  The referral has been placed and she can also contact their office to work out some type of a payment arrangement again I recommend that she follow-up with gastro to be seen. I have already placed a referral however I do see that it was canceled because she declined to schedule the visit.  If another referral is warranted I can process that.  Secondly her vaginal swab results indicated that she has bacterial vaginosis.  Which she will need to be treated with a 7-day course of metronidazole 500 mg twice daily. I will send medication to [facility].   Carroll Sage. Kenton Kingfisher, MSN, FNP-C [facility]  [address] C[address] [place], [place] [address] [phone]

## 2017-07-24 ENCOUNTER — Other Ambulatory Visit: Payer: Self-pay | Admitting: Family Medicine

## 2017-07-24 DIAGNOSIS — E785 Hyperlipidemia, unspecified: Secondary | ICD-10-CM

## 2017-07-24 DIAGNOSIS — K219 Gastro-esophageal reflux disease without esophagitis: Secondary | ICD-10-CM

## 2017-07-24 MED ORDER — POTASSIUM CHLORIDE ER 10 MEQ PO TBCR: 10.0000 meq | EXTENDED_RELEASE_TABLET | Freq: Every day | ORAL | 0 refills | Status: DC | PRN

## 2017-07-24 NOTE — Addendum Note (Signed)
Addended by: Scot Jun on: 07/24/2017 08:36 AM   Modules accepted: Orders

## 2017-09-19 ENCOUNTER — Other Ambulatory Visit: Payer: Self-pay | Admitting: Family Medicine

## 2017-10-18 ENCOUNTER — Ambulatory Visit (INDEPENDENT_AMBULATORY_CARE_PROVIDER_SITE_OTHER): Payer: Self-pay | Admitting: Family Medicine

## 2017-10-18 ENCOUNTER — Telehealth: Payer: Self-pay | Admitting: Family Medicine

## 2017-10-18 ENCOUNTER — Encounter: Payer: Self-pay | Admitting: Family Medicine

## 2017-10-18 VITALS — BP 90/62 | HR 86 | Temp 97.9°F | Ht 64.0 in | Wt 226.0 lb

## 2017-10-18 DIAGNOSIS — E785 Hyperlipidemia, unspecified: Secondary | ICD-10-CM

## 2017-10-18 DIAGNOSIS — R748 Abnormal levels of other serum enzymes: Secondary | ICD-10-CM

## 2017-10-18 DIAGNOSIS — E669 Obesity, unspecified: Secondary | ICD-10-CM

## 2017-10-18 DIAGNOSIS — Z6838 Body mass index (BMI) 38.0-38.9, adult: Secondary | ICD-10-CM

## 2017-10-18 DIAGNOSIS — K760 Fatty (change of) liver, not elsewhere classified: Secondary | ICD-10-CM

## 2017-10-18 DIAGNOSIS — I10 Essential (primary) hypertension: Secondary | ICD-10-CM

## 2017-10-18 DIAGNOSIS — K219 Gastro-esophageal reflux disease without esophagitis: Secondary | ICD-10-CM

## 2017-10-18 DIAGNOSIS — Z1231 Encounter for screening mammogram for malignant neoplasm of breast: Secondary | ICD-10-CM

## 2017-10-18 DIAGNOSIS — F4323 Adjustment disorder with mixed anxiety and depressed mood: Secondary | ICD-10-CM

## 2017-10-18 LAB — POCT URINALYSIS DIP (DEVICE)
BILIRUBIN URINE: NEGATIVE
Glucose, UA: NEGATIVE mg/dL
HGB URINE DIPSTICK: NEGATIVE
KETONES UR: NEGATIVE mg/dL
Leukocytes, UA: NEGATIVE
Nitrite: NEGATIVE
PH: 6 (ref 5.0–8.0)
Protein, ur: NEGATIVE mg/dL
Specific Gravity, Urine: 1.01 (ref 1.005–1.030)
Urobilinogen, UA: 0.2 mg/dL (ref 0.0–1.0)

## 2017-10-18 MED ORDER — NAPROXEN 500 MG PO TABS: ORAL_TABLET | ORAL | 0 refills | Status: DC

## 2017-10-18 MED ORDER — IPRATROPIUM BROMIDE 0.03 % NA SOLN: 2.0000 | Freq: Two times a day (BID) | NASAL | 0 refills | Status: DC

## 2017-10-18 MED ORDER — ATORVASTATIN CALCIUM 40 MG PO TABS: 40.0000 mg | ORAL_TABLET | Freq: Every day | ORAL | 0 refills | Status: DC

## 2017-10-18 MED ORDER — OMEPRAZOLE 20 MG PO CPDR: 40.0000 mg | DELAYED_RELEASE_CAPSULE | Freq: Every day | ORAL | 3 refills | Status: DC

## 2017-10-18 NOTE — Progress Notes (Signed)
Patient ID: Kara Torres, female    DOB: 09/28/1970, 47 y.o.   MRN: [id number]  PCP: Scot Jun, FNP  Chief Complaint  Patient presents with  . Follow-up    6 month chronic condition    Subjective:  HPI Kara Torres is a 47 y.o. female with fatty liver disease, chronic smoker, persistently elevated LFT, obesity, hypertension, dyslipidemia presents for medication refills. Kara Torres has not new complaints today. She request refills on medications.  She reports continued daily smoking. Denies chronic cough, shortness of breath, or wheezing. She has not experienced any recent left sided abdominal pain. Last US of the abdomen was significant for: IMPRESSION:1 . Prior cholecystectomy and 2. Increased hepatic echogenicity as can be seen with hepatic steatosis. She was recommended to follow-up with gastroenterology although declined as she is uninsured. Last liver function tests were abnormally elevated AST123/ALT115.  Denies jaundice or changes in color of urine/stool.  Social History   Socioeconomic History  . Marital status: Divorced    Spouse name: Not on file  . Number of children: Not on file  . Years of education: Not on file  . Highest education level: Not on file  Occupational History  . Occupation: Therapist, art  Social Needs  . Financial resource strain: Not on file  . Food insecurity:    Worry: Not on file    Inability: Not on file  . Transportation needs:    Medical: Not on file    Non-medical: Not on file  Tobacco Use  . Smoking status: Current Some Day Smoker  . Smokeless tobacco: Never Used  Substance and Sexual Activity  . Alcohol use: Yes    Alcohol/week: 1.2 oz    Types: 2 Cans of beer per week    Comment: occ  . Drug use: No  . Sexual activity: Not on file  Lifestyle  . Physical activity:    Days per week: Not on file    Minutes per session: Not on file  . Stress: Not on file  Relationships  . Social connections:    Talks on phone: Not on  file    Gets together: Not on file    Attends religious service: Not on file    Active member of club or organization: Not on file    Attends meetings of clubs or organizations: Not on file    Relationship status: Not on file  . Intimate partner violence:    Fear of current or ex partner: Not on file    Emotionally abused: Not on file    Physically abused: Not on file    Forced sexual activity: Not on file  Other Topics Concern  . Not on file  Social History Narrative  . Not on file    Family History  Problem Relation Age of Onset  . Stroke Mother   . Hyperlipidemia Mother   . Diabetes Mother   . Diabetes Father   . Hypertension Father   . Hyperlipidemia Father   . Mental illness Father   . Stroke Father   . Diabetes Maternal Grandmother   . Heart disease Maternal Grandmother   . Hypertension Maternal Grandmother   . Heart attack Maternal Grandmother    Review of Systems  Pertinent negatives listed in HPI Patient Active Problem List   Diagnosis Date Noted  . Daytime sleepiness 04/28/2017  . Essential hypertension 07/13/2016  . Obesity 07/13/2016  . Dyslipidemia 07/13/2016  . Fatty liver disease, nonalcoholic 79/08/4095  . Generalized anxiety  disorder 07/13/2016    Allergies  Allergen Reactions  . Epinephrine   . Hydrocodone Rash    Prior to Admission medications   Medication Sig Start Date End Date Taking? Authorizing Provider  atorvastatin (LIPITOR) 40 MG tablet TAKE 1 Tablet BY MOUTH ONCE DAILY 07/24/17  Yes Scot Jun, FNP  clonazePAM (KLONOPIN) 1 MG tablet Take 1 mg by mouth 2 (two) times daily as needed for anxiety.   Yes [provider]  cloNIDine (CATAPRES) 0.1 MG tablet Take 2 tablets (0.2 mg total) by mouth at bedtime. Unsure of strength 04/19/17  Yes Scot Jun, FNP  diphenhydrAMINE (SOMINEX) 25 MG tablet Take 25 mg by mouth at bedtime. Reported on 01/26/2016   Yes [provider]  FLUoxetine (PROZAC) 40 MG capsule Take 1  capsule (40 mg total) by mouth daily. Patient taking differently: Take 20 mg by mouth daily.  04/19/17  Yes Scot Jun, FNP  fluticasone (FLONASE) 50 MCG/ACT nasal spray Place 2 sprays into both nostrils 2 (two) times daily. 04/19/17  Yes Scot Jun, FNP  furosemide (LASIX) 20 MG tablet TAKE 1 Tablet BY MOUTH ONCE DAILY AS NEEDED 09/19/17  Yes Scot Jun, FNP  naproxen (NAPROSYN) 500 MG tablet TAKE 1 Tablet  BY MOUTH TWICE DAILY WITH MEALS 07/24/17  Yes Scot Jun, FNP  omeprazole (PRILOSEC) 20 MG capsule Take 2 capsules (40 mg total) by mouth daily. 11/12/16  Yes Scot Jun, FNP  potassium chloride (K-DUR) 10 MEQ tablet TAKE 1 Tablet BY MOUTH ONCE DAILY AS NEEDED ONLY WHEN TAKING FUROSEMIDE 09/19/17  Yes Scot Jun, FNP  traMADol (ULTRAM) 50 MG tablet Take 1 tablet (50 mg total) by mouth every 6 (six) hours as needed. 07/09/17  Yes Scot Jun, FNP    Past Medical, Surgical Family and Social History reviewed and updated.    Objective:   Today's Vitals   10/18/17 1523  BP: 90/62  Pulse: 86  Temp: 97.9 F (36.6 C)  TempSrc: Oral  SpO2: 99%  Weight: 226 lb (102.5 kg)  Height: 5\' 4"  (1.626 m)    Wt Readings from Last 3 Encounters:  10/18/17 226 lb (102.5 kg)  07/09/17 224 lb (101.6 kg)  04/19/17 230 lb (104.3 kg)    Physical Exam Constitutional: Patient appears well-developed and well-nourished. No distress. HENT: Normocephalic, atraumatic, External right and left ear normal. Oropharynx is clear and moist.  Eyes: Conjunctivae and EOM are normal. PERRLA, no scleral icterus. Neck: Normal ROM. Neck supple. No JVD. No tracheal deviation. No thyromegaly. CVS: RRR, S1/S2 +, no murmurs, no gallops, no carotid bruit.  Pulmonary: Effort and breath sounds normal, no stridor, rhonchi, wheezes, rales.  Abdominal: Soft. BS +, no distension, tenderness, rebound or guarding.  Musculoskeletal: Normal range of motion. No edema and no tenderness.   Neuro: Alert. Normal reflexes, muscle tone coordination. No cranial nerve deficit. Skin: Skin is warm and dry. No rash noted. Not diaphoretic. No erythema. No pallor. Psychiatric: Normal mood and affect. Behavior, judgment, thought content normal.  Assessment & Plan:  1. Elevated liver enzymes, previously 12/18 (AST123/ALT115). Repeating comprehensive metabolic panel today.  2. Adjustment disorder with mixed anxiety and depressed mood - continue outpatient management with [facility]  3. Gastroesophageal reflux disease without esophagitis,   4. Essential hypertension, blood pressure is low. She is chronically prescribed Clonidine for hot flashes and this medication can drastically reduce BP. Patient did not that she is now using CBD oil for chronic pain. Advised  patient to closely monitor her BP and hydrate with 6-8 glasses of water daily.  5. Dyslipidemia, Continue astorvastatin (LIPITOR) 40 MG tablet. Recommend engagement in physical activity to facilitate weight loss.  6. Breast cancer screening by mammogram, routine screening  7. Fatty liver disease, nonalcoholic, Re-emphasized weight loss, exercise, and DASH diet.   8. Class 2 obesity without serious comorbidity with body mass index (BMI) of 38.0 to 38.9 in adult, unspecified obesity type Goal BMI  <25. Encouraged efforts to reduce weight include engaging in physical activity as tolerated with goal of 150 minutes per week. Improve dietary choices and eat a meal regimen consistent with a Mediterranean or DASH diet. Reduce simple carbohydrates. Do not skip meals and eat healthy snacks throughout the day to avoid over-eating at dinner. Set a goal weight loss that is achievable for you.   Meds ordered this encounter  Medications  . ipratropium (ATROVENT) 0.03 % nasal spray    Sig: Place 2 sprays into both nostrils 2 (two) times daily.    Dispense:  30 mL    Refill:  0    Order Specific Question:   Supervising Provider    Answer:    Tresa Garter [id number]  . omeprazole (PRILOSEC) 20 MG capsule    Sig: Take 2 capsules (40 mg total) by mouth daily.    Dispense:  30 capsule    Refill:  3    Order Specific Question:   Supervising Provider    Answer:   Tresa Garter [id number]  . naproxen (NAPROSYN) 500 MG tablet    Sig: TAKE 1 Tablet  BY MOUTH TWICE DAILY WITH MEALS    Dispense:  180 tablet    Refill:  0    Order Specific Question:   Supervising Provider    Answer:   Tresa Garter [id number]  . atorvastatin (LIPITOR) 40 MG tablet    Sig: Take 1 tablet (40 mg total) by mouth daily.    Dispense:  90 tablet    Refill:  0    Order Specific Question:   Supervising Provider    Answer:   Tresa Garter [id number]    Orders Placed This Encounter  Procedures  . Comprehensive metabolic panel  . POCT urinalysis dip (device)    RTC: 6 months for chronic condition management     Carroll Sage. Kenton Kingfisher, MSN, FNP-C [facility]  [address] [place], [place] [address] [phone]

## 2017-10-18 NOTE — Patient Instructions (Signed)
To schedule your breast exam, please call [facility] at   Phone: [phone], through the scholarship program.    Fatty Liver Fatty liver, also called hepatic steatosis or steatohepatitis, is a condition in which too much fat has built up in your liver cells. The liver removes harmful substances from your bloodstream. It produces fluids your body needs. It also helps your body use and store energy from the food you eat. In many cases, fatty liver does not cause symptoms or problems. It is often diagnosed when tests are being done for other reasons. However, over time, fatty liver can cause inflammation that may lead to more serious liver problems, such as scarring of the liver (cirrhosis). What are the causes? Causes of fatty liver may include:  Drinking too much alcohol.  Poor nutrition.  Obesity.  Cushing syndrome.  Diabetes.  Hyperlipidemia.  Pregnancy.  Certain drugs.  Poisons.  Some viral infections.  What increases the risk? You may be more likely to develop fatty liver if you:  Abuse alcohol.  Are pregnant.  Are overweight.  Have diabetes.  Have hepatitis.  Have a high triglyceride level.  What are the signs or symptoms? Fatty liver often does not cause any symptoms. In cases where symptoms develop, they can include:  Fatigue.  Weakness.  Weight loss.  Confusion.  Abdominal pain.  Yellowing of your skin and the white parts of your eyes (jaundice).  Nausea and vomiting.  How is this diagnosed? Fatty liver may be diagnosed by:  Physical exam and medical history.  Blood tests.  Imaging tests, such as an ultrasound, CT scan, or MRI.  Liver biopsy. A small sample of liver tissue is removed using a needle. The sample is then looked at under a microscope.  How is this treated? Fatty liver is often caused by other health conditions. Treatment for fatty liver may involve medicines and lifestyle changes to manage conditions such  as:  Alcoholism.  High cholesterol.  Diabetes.  Being overweight or obese.  Follow these instructions at home:  Eat a healthy diet as directed by your health care provider.  Exercise regularly. This can help you lose weight and control your cholesterol and diabetes. Talk to your health care provider about an exercise plan and which activities are best for you.  Do not drink alcohol.  Take medicines only as directed by your health care provider. Contact a health care provider if: You have difficulty controlling your:  Blood sugar.  Cholesterol.  Alcohol consumption.  Get help right away if:  You have abdominal pain.  You have jaundice.  You have nausea and vomiting. This information is not intended to replace advice given to you by your health care provider. Make sure you discuss any questions you have with your health care provider. Document Released: 08/24/2005 Document Revised: 12/15/2015 Document Reviewed: 11/18/2013 Elsevier Interactive Patient Education  [employer].

## 2017-10-18 NOTE — Telephone Encounter (Signed)
erroneous

## 2017-10-19 LAB — COMPREHENSIVE METABOLIC PANEL
A/G RATIO: 1.3 (ref 1.2–2.2)
ALT: 47 IU/L — ABNORMAL HIGH (ref 0–32)
AST: 60 IU/L — ABNORMAL HIGH (ref 0–40)
Albumin: 4.2 g/dL (ref 3.5–5.5)
Alkaline Phosphatase: 82 IU/L (ref 39–117)
BILIRUBIN TOTAL: 0.3 mg/dL (ref 0.0–1.2)
BUN/Creatinine Ratio: 11 (ref 9–23)
BUN: 11 mg/dL (ref 6–24)
CALCIUM: 9 mg/dL (ref 8.7–10.2)
CHLORIDE: 103 mmol/L (ref 96–106)
CO2: 20 mmol/L (ref 20–29)
Creatinine, Ser: 1.02 mg/dL — ABNORMAL HIGH (ref 0.57–1.00)
GFR calc non Af Amer: 67 mL/min/{1.73_m2} (ref >59)
GFR, EST AFRICAN AMERICAN: 77 mL/min/{1.73_m2} (ref >59)
GLUCOSE: 100 mg/dL — AB (ref 65–99)
Globulin, Total: 3.2 g/dL (ref 1.5–4.5)
POTASSIUM: 4.3 mmol/L (ref 3.5–5.2)
Sodium: 139 mmol/L (ref 134–144)
TOTAL PROTEIN: 7.4 g/dL (ref 6.0–8.5)

## 2017-10-20 ENCOUNTER — Encounter: Payer: Self-pay | Admitting: Family Medicine

## 2017-10-20 NOTE — Progress Notes (Signed)
Mail lab letter  

## 2017-10-21 ENCOUNTER — Other Ambulatory Visit: Payer: Self-pay | Admitting: Family Medicine

## 2017-10-21 DIAGNOSIS — Z1231 Encounter for screening mammogram for malignant neoplasm of breast: Secondary | ICD-10-CM

## 2017-10-30 ENCOUNTER — Other Ambulatory Visit: Payer: Self-pay | Admitting: Family Medicine

## 2017-10-30 DIAGNOSIS — Z1231 Encounter for screening mammogram for malignant neoplasm of breast: Secondary | ICD-10-CM

## 2018-01-09 ENCOUNTER — Other Ambulatory Visit: Payer: Self-pay | Admitting: Family Medicine

## 2018-01-09 DIAGNOSIS — E785 Hyperlipidemia, unspecified: Secondary | ICD-10-CM

## 2018-01-09 DIAGNOSIS — K219 Gastro-esophageal reflux disease without esophagitis: Secondary | ICD-10-CM

## 2018-02-27 ENCOUNTER — Telehealth: Payer: Self-pay

## 2018-03-03 MED ORDER — OMEPRAZOLE 40 MG PO CPDR: 40.0000 mg | DELAYED_RELEASE_CAPSULE | Freq: Every day | ORAL | 1 refills | Status: DC

## 2018-03-03 NOTE — Telephone Encounter (Signed)
Medication sent to [facility]. Patient has follow up with Lanelle Bal on 04/21/2018.

## 2018-03-20 ENCOUNTER — Other Ambulatory Visit: Payer: Self-pay | Admitting: Family Medicine

## 2018-03-20 DIAGNOSIS — E785 Hyperlipidemia, unspecified: Secondary | ICD-10-CM

## 2018-03-20 DIAGNOSIS — K219 Gastro-esophageal reflux disease without esophagitis: Secondary | ICD-10-CM

## 2018-04-16 ENCOUNTER — Other Ambulatory Visit: Payer: Self-pay | Admitting: Family Medicine

## 2018-04-18 ENCOUNTER — Encounter: Payer: Self-pay | Admitting: Family Medicine

## 2018-04-18 ENCOUNTER — Ambulatory Visit: Payer: No Typology Code available for payment source | Admitting: Family Medicine

## 2018-04-18 VITALS — BP 120/88 | HR 95 | Temp 98.0°F | Ht 64.0 in | Wt 215.0 lb

## 2018-04-18 DIAGNOSIS — Z6836 Body mass index (BMI) 36.0-36.9, adult: Secondary | ICD-10-CM

## 2018-04-18 DIAGNOSIS — M109 Gout, unspecified: Secondary | ICD-10-CM

## 2018-04-18 DIAGNOSIS — E785 Hyperlipidemia, unspecified: Secondary | ICD-10-CM

## 2018-04-18 DIAGNOSIS — G4700 Insomnia, unspecified: Secondary | ICD-10-CM

## 2018-04-18 DIAGNOSIS — E66812 Obesity, class 2: Secondary | ICD-10-CM

## 2018-04-18 DIAGNOSIS — Z09 Encounter for follow-up examination after completed treatment for conditions other than malignant neoplasm: Secondary | ICD-10-CM

## 2018-04-18 DIAGNOSIS — I10 Essential (primary) hypertension: Secondary | ICD-10-CM

## 2018-04-18 DIAGNOSIS — J302 Other seasonal allergic rhinitis: Secondary | ICD-10-CM

## 2018-04-18 DIAGNOSIS — Z131 Encounter for screening for diabetes mellitus: Secondary | ICD-10-CM

## 2018-04-18 DIAGNOSIS — R8290 Unspecified abnormal findings in urine: Secondary | ICD-10-CM

## 2018-04-18 DIAGNOSIS — K219 Gastro-esophageal reflux disease without esophagitis: Secondary | ICD-10-CM

## 2018-04-18 DIAGNOSIS — R232 Flushing: Secondary | ICD-10-CM

## 2018-04-18 DIAGNOSIS — N390 Urinary tract infection, site not specified: Secondary | ICD-10-CM

## 2018-04-18 DIAGNOSIS — R634 Abnormal weight loss: Secondary | ICD-10-CM

## 2018-04-18 DIAGNOSIS — D649 Anemia, unspecified: Secondary | ICD-10-CM

## 2018-04-18 DIAGNOSIS — Z0000 Encounter for general adult medical examination without abnormal findings: Secondary | ICD-10-CM

## 2018-04-18 LAB — POCT URINALYSIS DIP (MANUAL ENTRY)
Bilirubin, UA: NEGATIVE
Blood, UA: NEGATIVE
Glucose, UA: NEGATIVE mg/dL
Ketones, POC UA: NEGATIVE mg/dL
Nitrite, UA: NEGATIVE
Protein Ur, POC: NEGATIVE mg/dL
Spec Grav, UA: <=1.005 — AB (ref 1.010–1.025)
Urobilinogen, UA: 0.2 E.U./dL
pH, UA: 6 (ref 5.0–8.0)

## 2018-04-18 LAB — POCT GLYCOSYLATED HEMOGLOBIN (HGB A1C): Hemoglobin A1C: 5 % (ref 4.0–5.6)

## 2018-04-18 MED ORDER — NAPROXEN 500 MG PO TABS: 500.0000 mg | ORAL_TABLET | Freq: Two times a day (BID) | ORAL | 6 refills | Status: DC

## 2018-04-18 MED ORDER — OMEPRAZOLE 40 MG PO CPDR: 40.0000 mg | DELAYED_RELEASE_CAPSULE | Freq: Every day | ORAL | 1 refills | Status: DC

## 2018-04-18 MED ORDER — SALINE SPRAY 0.65 % NA SOLN: 1.0000 | NASAL | 6 refills | Status: DC | PRN

## 2018-04-18 MED ORDER — ATORVASTATIN CALCIUM 40 MG PO TABS: 40.0000 mg | ORAL_TABLET | Freq: Every day | ORAL | 1 refills | Status: DC

## 2018-04-18 MED ORDER — SULFAMETHOXAZOLE-TRIMETHOPRIM 800-160 MG PO TABS: 1.0000 | ORAL_TABLET | Freq: Two times a day (BID) | ORAL | 0 refills | Status: DC

## 2018-04-18 NOTE — Progress Notes (Signed)
Follow Up  Subjective:    Patient ID: Kara Torres, female    DOB: [birth year], 47 y.o.   MRN: [id number]   Chief Complaint  Patient presents with  . Follow-up    chronic conditons   HPI  Kara Torres is a 47 year old female with a past medical history of Liver Disease, Depression, Asthma, Anxiety, and Allergy. She is here today for follow up.   Current Status: Since her last office visit, she is doing well with no complaints.   She continues to follow up at [facility], with Dr. Grover Canavan, and Lenna Sciara. Her last appointment was 01/2018, with follow up visits every 8 weeks. She continues to take Clonidine for aide with sleep, as prescribed by Psychiatrist.   She is currently been using CBD supplements for the past 6 months for pain, inflammation, depression, and anxiety. She states that is has been effective in relieving symptoms. Minor vision changes. She denies chest pain, heart palpitations, cough, shortness of breath, and falls. She has occasionally headaches and dizziness with position changes. Denies severe headaches, confusion, seizures, double vision, and blurred vision, nausea and vomiting. She has seasonal allergies, but states that insurance will not cover Flonase or Atrovent nasal spray.  She denies fevers, chills, fatigue, recent infections, weight loss, and night sweats. No reports of GI problems such as diarrhea, and constipation. She has no reports of blood in stools, dysuria and hematuria. No depression or anxiety, and denies suicidal ideations, homicidal ideations, or auditory hallucinations. She denies pain today.   Past Medical History:  Diagnosis Date  . Allergy   . Alpha-1-antitrypsin deficiency ([place])   . Anxiety   . Asthma   . Depression   . Liver disease     Family History  Problem Relation Age of Onset  . Stroke Mother   . Hyperlipidemia Mother   . Diabetes Mother   . Diabetes Father   . Hypertension Father   . Hyperlipidemia Father   . Mental  illness Father   . Stroke Father   . Diabetes Maternal Grandmother   . Heart disease Maternal Grandmother   . Hypertension Maternal Grandmother   . Heart attack Maternal Grandmother     Social History   Socioeconomic History  . Marital status: Divorced    Spouse name: Not on file  . Number of children: Not on file  . Years of education: Not on file  . Highest education level: Not on file  Occupational History  . Occupation: Therapist, art  Social Needs  . Financial resource strain: Not on file  . Food insecurity:    Worry: Not on file    Inability: Not on file  . Transportation needs:    Medical: Not on file    Non-medical: Not on file  Tobacco Use  . Smoking status: Current Some Day Smoker  . Smokeless tobacco: Never Used  Substance and Sexual Activity  . Alcohol use: Yes    Alcohol/week: 2.0 standard drinks    Types: 2 Cans of beer per week    Comment: occ  . Drug use: No  . Sexual activity: Not on file  Lifestyle  . Physical activity:    Days per week: Not on file    Minutes per session: Not on file  . Stress: Not on file  Relationships  . Social connections:    Talks on phone: Not on file    Gets together: Not on file    Attends religious service: Not on file  Active member of club or organization: Not on file    Attends meetings of clubs or organizations: Not on file    Relationship status: Not on file  . Intimate partner violence:    Fear of current or ex partner: Not on file    Emotionally abused: Not on file    Physically abused: Not on file    Forced sexual activity: Not on file  Other Topics Concern  . Not on file  Social History Narrative  . Not on file    Past Surgical History:  Procedure Laterality Date  . CHOLECYSTECTOMY  2000    Immunization History  Administered Date(s) Administered  . Tdap 07/23/2008    Current Meds  Medication Sig  . atorvastatin (LIPITOR) 40 MG tablet Take 1 tablet (40 mg total) by mouth daily.  .  cloNIDine (CATAPRES) 0.1 MG tablet Take 2 tablets (0.2 mg total) by mouth at bedtime. Unsure of strength  . naproxen (NAPROSYN) 500 MG tablet Take 1 tablet (500 mg total) by mouth 2 (two) times daily with a meal.  . omeprazole (PRILOSEC) 40 MG capsule Take 1 capsule (40 mg total) by mouth daily.  . [DISCONTINUED] atorvastatin (LIPITOR) 40 MG tablet TAKE 1 Tablet BY MOUTH ONCE DAILY  . [DISCONTINUED] naproxen (NAPROSYN) 500 MG tablet TAKE 1 Tablet  BY MOUTH TWICE DAILY WITH MEALS  . [DISCONTINUED] omeprazole (PRILOSEC) 40 MG capsule Take 1 capsule (40 mg total) by mouth daily.    Allergies  Allergen Reactions  . Epinephrine   . Hydrocodone Rash    BP 120/88 (BP Location: Left Arm, Patient Position: Sitting, Cuff Size: Large)   Pulse 95   Temp 98 F (36.7 C) (Oral)   Ht 5\' 4"  (1.626 m)   Wt 215 lb (97.5 kg)   LMP 04/16/2018   SpO2 98%   BMI 36.90 kg/m    Review of Systems  Constitutional: Negative.   HENT: Negative.   Eyes: Negative.   Respiratory: Negative.   Cardiovascular: Negative.   Gastrointestinal: Positive for abdominal distention (Obese).  Endocrine: Negative.   Genitourinary: Negative.   Musculoskeletal: Negative.   Skin: Negative.   Allergic/Immunologic: Negative.   Neurological: Negative.   Hematological: Negative.   Psychiatric/Behavioral: Negative.    Objective:   Physical Exam  Constitutional: She appears well-developed and well-nourished.  Neck: Neck supple.  Cardiovascular: Normal rate, regular rhythm, normal heart sounds and intact distal pulses.  Pulmonary/Chest: Effort normal and breath sounds normal.  Abdominal: Soft. Bowel sounds are normal.  Musculoskeletal: Normal range of motion.  Neurological: She is alert.  Skin: Skin is dry.  Psychiatric: She has a normal mood and affect. Her behavior is normal. Judgment normal.  Nursing note and vitals reviewed.  Assessment & Plan:   1. Essential hypertension Antihypertensive medications are stable.  Blood pressure is stable at 120/88 today. She will continue Clonidine as prescribed. She will continue to decrease high sodium intake, excessive alcohol intake, increase potassium intake, smoking cessation, and increase physical activity of at least 30 minutes of cardio activity daily. She will continue to follow Heart Healthy or DASH diet.  2. Dyslipidemia Refill.  - atorvastatin (LIPITOR) 40 MG tablet; Take 1 tablet (40 mg total) by mouth daily.  Dispense: 90 tablet; Refill: 1  3. Seasonal allergies We will initiate Zyrtec today.  - sodium chloride (OCEAN) 0.65 % SOLN nasal spray; Place 1 spray into both nostrils as needed for congestion.  Dispense: 1 Bottle; Refill: 6  4. Insomnia, unspecified type Stable.  5. Gastroesophageal reflux disease without esophagitis - omeprazole (PRILOSEC) 40 MG capsule; Take 1 capsule (40 mg total) by mouth daily.  Dispense: 90 capsule; Refill: 1  6. Anemia, unspecified type Hgb 10.6 on 07/09/2018. CBC today, results are pending.  7. Hot flashes Stable.   8. Weight decrease She has and intentional weight loss of 11 lbs within 6 months. She will continue Healthy food choices and DASH diet.   9. Class 2 severe obesity due to excess calories with serious comorbidity and body mass index (BMI) of 36.0 to 36.9 in adult (HCC) Goal BMI is <30 Encouraged efforts to reduce weight include engaging in physical activity as tolerated with goal of 150 minutes per week. Improve dietary choices and eat a meal regimen consistent with a Mediterranean or DASH diet. Reduce simple carbohydrates. Do not skip meals and eat healthy snacks throughout the day to avoid over-eating at dinner. Set a goal weight loss that is achievable for you.  10. Gout involving toe of right foot, unspecified cause, unspecified chronicity Stable today.  - naproxen (NAPROSYN) 500 MG tablet; Take 1 tablet (500 mg total) by mouth 2 (two) times daily with a meal.  Dispense: 180 tablet; Refill: 6  11.  Urinary tract infection without hematuria, site unspecified - sulfamethoxazole-trimethoprim (BACTRIM DS,SEPTRA DS) 800-160 MG tablet; Take 1 tablet by mouth 2 (two) times daily.  Dispense: 14 tablet; Refill: 0  12. Abnormal urinalysis - Urine Culture  13. Screening for diabetes mellitus Hgb A1c is normal at 5.0 today. She will continue to decrease foods/beverages high in sugars and carbs and follow Heart Healthy or DASH diet. Increase physical activity to at least 30 minutes cardio exercise daily.   - POCT glycosylated hemoglobin (Hb A1C) - POCT urinalysis dipstick  14. Healthcare maintenance - CBC with Differential - Comprehensive metabolic panel  15. Follow up He will follow up in 6 months.   Meds ordered this encounter  Medications  . sodium chloride (OCEAN) 0.65 % SOLN nasal spray    Sig: Place 1 spray into both nostrils as needed for congestion.    Dispense:  1 Bottle    Refill:  6  . sulfamethoxazole-trimethoprim (BACTRIM DS,SEPTRA DS) 800-160 MG tablet    Sig: Take 1 tablet by mouth 2 (two) times daily.    Dispense:  14 tablet    Refill:  0  . atorvastatin (LIPITOR) 40 MG tablet    Sig: Take 1 tablet (40 mg total) by mouth daily.    Dispense:  90 tablet    Refill:  1  . naproxen (NAPROSYN) 500 MG tablet    Sig: Take 1 tablet (500 mg total) by mouth 2 (two) times daily with a meal.    Dispense:  180 tablet    Refill:  6  . omeprazole (PRILOSEC) 40 MG capsule    Sig: Take 1 capsule (40 mg total) by mouth daily.    Dispense:  90 capsule    Refill:  [place],  MSN, FNP-C [facility] [address] [place], [place] [address] [phone]

## 2018-04-18 NOTE — Patient Instructions (Signed)
Sulfamethoxazole; Trimethoprim, SMX-TMP oral suspension What is this medicine? SULFAMETHOXAZOLE; TRIMETHOPRIM or SMX-TMP (suhl fuh meth OK suh zohl; trye METH oh prim) is a combination of a sulfonamide antibiotic and a second antibiotic, trimethoprim. It is used to treat or prevent certain kinds of bacterial infections.It will not work for colds, flu, or other viral infections. This medicine may be used for other purposes; ask your health care provider or pharmacist if you have questions. COMMON BRAND NAME(S): Septra, Sulfatrim, Sulfatrim Pediatric, Sultrex Pediatric What should I tell my health care provider before I take this medicine? They need to know if you have any of these conditions: -anemia -asthma -being treated with anticonvulsants -if you frequently drink alcohol containing drinks -kidney disease -liver disease -low level of folic acid or glucose-6-phosphate dehydrogenase -poor nutrition or malabsorption -porphyria -severe allergies -thyroid disorder -an unusual or allergic reaction to sulfamethoxazole, trimethoprim, sulfa drugs, other medicines, foods, dyes, or preservatives -pregnant or trying to get pregnant -breast-feeding How should I use this medicine? Take this suspension by mouth. Follow the directions on the prescription label. Shake the bottle well before taking. Use a specially marked spoon or container to measure your medicine. Ask your pharmacist if you do not have one. Household spoons are not accurate. Take your doses at regular intervals. Do not take more medicine than directed. Talk to your pediatrician regarding the use of this medicine in children. Special care may be needed. While this drug may be prescribed for children as young as 2 months of age for selected conditions, precautions do apply. Overdosage: If you think you have taken too much of this medicine contact a poison control center or emergency room at once. NOTE: This medicine is only for you. Do not  share this medicine with others. What if I miss a dose? If you miss a dose, take it as soon as you can. If it is almost time for your next dose, take only that dose. Do not take double or extra doses. What may interact with this medicine? Do not take this medicine with any of the following medications -aminobenzoate potassium -dofetilide -metronidazole This medicine may also interact with the following medications -ACE inhibitors like benazepril, enalapril, lisinopril, and ramipril -birth control pills -cyclosporine -digoxin -diuretics -indomethacin -medicines for diabetes -methenamine -methotrexate -phenytoin -potassium supplements -pyrimethamine -sulfinpyrazone -tricyclic antidepressants -warfarin This list may not describe all possible interactions. Give your health care provider a list of all the medicines, herbs, non-prescription drugs, or dietary supplements you use. Also tell them if you smoke, drink alcohol, or use illegal drugs. Some items may interact with your medicine. What should I watch for while using this medicine? Tell your doctor or health care professional if your symptoms do not improve. Drink several glasses of water a day to reduce the risk of kidney problems. Do not treat diarrhea with over the counter products. Contact your doctor if you have diarrhea that lasts more than 2 days or if it is severe and watery. This medicine can make you more sensitive to the sun. Keep out of the sun. If you cannot avoid being in the sun, wear protective clothing and use a sunscreen. Do not use sun lamps or tanning beds/booths. What side effects may I notice from receiving this medicine? Side effects that you should report to your doctor or health care professional as soon as possible: -allergic reactions like skin rash or hives, swelling of the face, lips, or tongue -breathing problems -fever or chills, sore throat -irregular heartbeat, chest pain -  joint or muscle pain -pain  or difficulty passing urine -red pinpoint spots on skin -redness, blistering, peeling or loosening of the skin, including inside the mouth -unusual bleeding or bruising -unusual weakness or tiredness -yellowing of the eyes or skin Side effects that usually do not require medical attention (report to your doctor or health care professional if they continue or are bothersome): -diarrhea -dizziness -headache -loss of appetite -nausea, vomiting -nervousness This list may not describe all possible side effects. Call your doctor for medical advice about side effects. You may report side effects to FDA at 1-800-FDA-1088. Where should I keep my medicine? Keep out of the reach of children. Store at room temperature between 15 and 25 degrees C (59 and 77 degrees F). Protect from light and moisture. Throw away any unused medicine after the expiration date. NOTE: This sheet is a summary. It may not cover all possible information. If you have questions about this medicine, talk to your doctor, pharmacist, or health care provider.  2018 Elsevier/Gold Standard (2013-02-13 14:37:40)  

## 2018-04-19 LAB — CBC WITH DIFFERENTIAL/PLATELET
Basophils Absolute: 0 10*3/uL (ref 0.0–0.2)
Basos: 0 %
EOS (ABSOLUTE): 0.1 10*3/uL (ref 0.0–0.4)
Eos: 2 %
Hematocrit: 32 % — ABNORMAL LOW (ref 34.0–46.6)
Hemoglobin: 9.6 g/dL — ABNORMAL LOW (ref 11.1–15.9)
Immature Grans (Abs): 0 10*3/uL (ref 0.0–0.1)
Immature Granulocytes: 0 %
Lymphocytes Absolute: 1.1 10*3/uL (ref 0.7–3.1)
Lymphs: 22 %
MCH: 24.6 pg — ABNORMAL LOW (ref 26.6–33.0)
MCHC: 30 g/dL — ABNORMAL LOW (ref 31.5–35.7)
MCV: 82 fL (ref 79–97)
Monocytes Absolute: 0.4 10*3/uL (ref 0.1–0.9)
Monocytes: 8 %
Neutrophils Absolute: 3.3 10*3/uL (ref 1.4–7.0)
Neutrophils: 68 %
Platelets: 164 10*3/uL (ref 150–450)
RBC: 3.9 x10E6/uL (ref 3.77–5.28)
RDW: 17.2 % — ABNORMAL HIGH (ref 12.3–15.4)
WBC: 4.9 10*3/uL (ref 3.4–10.8)

## 2018-04-19 LAB — COMPREHENSIVE METABOLIC PANEL
ALT: 54 IU/L — ABNORMAL HIGH (ref 0–32)
AST: 124 IU/L — ABNORMAL HIGH (ref 0–40)
Albumin/Globulin Ratio: 1.2 (ref 1.2–2.2)
Albumin: 4 g/dL (ref 3.5–5.5)
Alkaline Phosphatase: 98 IU/L (ref 39–117)
BUN/Creatinine Ratio: 9 (ref 9–23)
BUN: 8 mg/dL (ref 6–24)
Bilirubin Total: 0.4 mg/dL (ref 0.0–1.2)
CO2: 23 mmol/L (ref 20–29)
Calcium: 8.8 mg/dL (ref 8.7–10.2)
Chloride: 100 mmol/L (ref 96–106)
Creatinine, Ser: 0.89 mg/dL (ref 0.57–1.00)
GFR calc Af Amer: 91 mL/min/{1.73_m2} (ref >59)
GFR calc non Af Amer: 79 mL/min/{1.73_m2} (ref >59)
Globulin, Total: 3.4 g/dL (ref 1.5–4.5)
Glucose: 113 mg/dL — ABNORMAL HIGH (ref 65–99)
Potassium: 4.1 mmol/L (ref 3.5–5.2)
Sodium: 139 mmol/L (ref 134–144)
Total Protein: 7.4 g/dL (ref 6.0–8.5)

## 2018-04-19 MED ORDER — CETIRIZINE HCL 10 MG PO TABS: 10.0000 mg | ORAL_TABLET | Freq: Every day | ORAL | 11 refills | Status: DC

## 2018-04-20 LAB — URINE CULTURE

## 2018-04-21 ENCOUNTER — Ambulatory Visit: Payer: Self-pay | Admitting: Family Medicine

## 2018-04-21 ENCOUNTER — Telehealth: Payer: Self-pay

## 2018-04-21 NOTE — Telephone Encounter (Signed)
Left a vm letting patient know that script was sent in

## 2018-04-21 NOTE — Telephone Encounter (Signed)
-----   Message from Azzie Glatter, FNP sent at 04/19/2018 10:45 AM EDT ----- Regarding: "'New Rx for Allergies" Kara Torres,   Please inform patient that order for Zyrtec to pharmacy today.    Thank you.

## 2018-04-28 ENCOUNTER — Telehealth: Payer: Self-pay | Admitting: Family Medicine

## 2018-04-28 NOTE — Telephone Encounter (Signed)
Reviewed labs with patient today. Verbalized understanding.

## 2018-05-09 ENCOUNTER — Telehealth: Payer: Self-pay

## 2018-05-09 NOTE — Telephone Encounter (Signed)
Left a vm for patient to callback 

## 2018-05-12 NOTE — Telephone Encounter (Signed)
Patient states that she developed a rash from the antibiotic and stop taking it. Patient wants to know if something else should be called in.

## 2018-05-20 ENCOUNTER — Telehealth: Payer: Self-pay | Admitting: Family Medicine

## 2018-05-20 NOTE — Telephone Encounter (Signed)
Phone call returned and message left on voicemail for patient to call office with concerns.

## 2018-05-22 ENCOUNTER — Telehealth: Payer: Self-pay

## 2018-10-17 ENCOUNTER — Other Ambulatory Visit: Payer: Self-pay

## 2018-10-17 ENCOUNTER — Ambulatory Visit (INDEPENDENT_AMBULATORY_CARE_PROVIDER_SITE_OTHER): Payer: Self-pay | Admitting: Family Medicine

## 2018-10-17 ENCOUNTER — Encounter: Payer: Self-pay | Admitting: Family Medicine

## 2018-10-17 VITALS — BP 126/86 | HR 94 | Temp 98.4°F | Ht 64.0 in | Wt 190.0 lb

## 2018-10-17 DIAGNOSIS — R112 Nausea with vomiting, unspecified: Secondary | ICD-10-CM

## 2018-10-17 DIAGNOSIS — R197 Diarrhea, unspecified: Secondary | ICD-10-CM | POA: Insufficient documentation

## 2018-10-17 DIAGNOSIS — J301 Allergic rhinitis due to pollen: Secondary | ICD-10-CM

## 2018-10-17 DIAGNOSIS — R1011 Right upper quadrant pain: Secondary | ICD-10-CM | POA: Insufficient documentation

## 2018-10-17 DIAGNOSIS — R1012 Left upper quadrant pain: Secondary | ICD-10-CM

## 2018-10-17 DIAGNOSIS — I10 Essential (primary) hypertension: Secondary | ICD-10-CM

## 2018-10-17 DIAGNOSIS — Z09 Encounter for follow-up examination after completed treatment for conditions other than malignant neoplasm: Secondary | ICD-10-CM

## 2018-10-17 HISTORY — DX: Allergic rhinitis due to pollen: J30.1

## 2018-10-17 HISTORY — DX: Nausea with vomiting, unspecified: R11.2

## 2018-10-17 HISTORY — DX: Diarrhea, unspecified: R19.7

## 2018-10-17 LAB — POCT URINALYSIS DIP (MANUAL ENTRY)
Bilirubin, UA: NEGATIVE
Blood, UA: NEGATIVE
Glucose, UA: NEGATIVE mg/dL
Ketones, POC UA: NEGATIVE mg/dL
Leukocytes, UA: NEGATIVE
Nitrite, UA: NEGATIVE
Protein Ur, POC: NEGATIVE mg/dL
Spec Grav, UA: <=1.005 — AB (ref 1.010–1.025)
Urobilinogen, UA: 0.2 E.U./dL
pH, UA: 6 (ref 5.0–8.0)

## 2018-10-17 MED ORDER — FLUTICASONE PROPIONATE 50 MCG/ACT NA SUSP: 2.0000 | Freq: Every day | NASAL | 3 refills | Status: DC

## 2018-10-17 MED ORDER — LOPERAMIDE HCL 2 MG PO TABS: 2.0000 mg | ORAL_TABLET | Freq: Four times a day (QID) | ORAL | 1 refills | Status: DC | PRN

## 2018-10-17 MED ORDER — IPRATROPIUM BROMIDE 0.03 % NA SOLN: 2.0000 | Freq: Two times a day (BID) | NASAL | 3 refills | Status: DC

## 2018-10-17 MED ORDER — ONDANSETRON HCL 4 MG PO TABS: 4.0000 mg | ORAL_TABLET | Freq: Three times a day (TID) | ORAL | 2 refills | Status: DC | PRN

## 2018-10-17 NOTE — Progress Notes (Signed)
[facility]   Established Patient Office Visit  Subjective:  Patient ID: Kara Torres, female    DOB: [birth year]  Age: 48 y.o. MRN: [id number]  CC:  Chief Complaint  Patient presents with  . Nausea  . Emesis  . Diarrhea  . Anemia    HPI Kara Torres is a 48 year old female who presents for follow up today.   Past Medical History:  Diagnosis Date  . Allergy   . Alpha-1-antitrypsin deficiency ([place])   . Anxiety   . Asthma   . Depression   . History of cholecystectomy   . Liver disease    Current Status: Since her last office visit, she states that she has been having decreased appetite, nausea, and vomiting X 3 months. States that abdominal pain is located in upper quadrant and hurts after she eats. No reports of GI problems such as constipation. She has no reports of blood in stools, dysuria and hematuria.She continues to follow up with [facility] for treatments, which she is currently on Clonidine for bad dreams. Her anxiety is stable today. She denies suicidal ideations, homicidal ideations, or auditory hallucinations.  She denies fevers, chills, fatigue, recent infections, weight loss, and night sweats. She has not had any headaches, visual changes, dizziness, and falls. No chest pain, heart palpitations, cough and shortness of breath reported.    Past Surgical History:  Procedure Laterality Date  . CHOLECYSTECTOMY  2000    Family History  Problem Relation Age of Onset  . Stroke Mother   . Hyperlipidemia Mother   . Diabetes Mother   . Diabetes Father   . Hypertension Father   . Hyperlipidemia Father   . Mental illness Father   . Stroke Father   . Diabetes Maternal Grandmother   . Heart disease Maternal Grandmother   . Hypertension Maternal Grandmother   . Heart attack Maternal Grandmother     Social History   Socioeconomic History  . Marital status: Divorced    Spouse name: Not on file  . Number  of children: Not on file  . Years of education: Not on file  . Highest education level: Not on file  Occupational History  . Occupation: Therapist, art  Social Needs  . Financial resource strain: Not on file  . Food insecurity:    Worry: Not on file    Inability: Not on file  . Transportation needs:    Medical: Not on file    Non-medical: Not on file  Tobacco Use  . Smoking status: Former Research scientist (life sciences)  . Smokeless tobacco: Never Used  Substance and Sexual Activity  . Alcohol use: Yes    Alcohol/week: 2.0 standard drinks    Types: 2 Cans of beer per week    Comment: occ  . Drug use: No  . Sexual activity: Not on file  Lifestyle  . Physical activity:    Days per week: Not on file    Minutes per session: Not on file  . Stress: Not on file  Relationships  . Social connections:    Talks on phone: Not on file    Gets together: Not on file    Attends religious service: Not on file    Active member of club or organization: Not on file    Attends meetings of clubs or organizations: Not on file    Relationship status: Not on file  . Intimate partner violence:    Fear of current or  ex partner: Not on file    Emotionally abused: Not on file    Physically abused: Not on file    Forced sexual activity: Not on file  Other Topics Concern  . Not on file  Social History Narrative  . Not on file    Outpatient Medications Prior to Visit  Medication Sig Dispense Refill  . atorvastatin (LIPITOR) 40 MG tablet Take 1 tablet (40 mg total) by mouth daily. 90 tablet 1  . cetirizine (ZYRTEC) 10 MG tablet Take 1 tablet (10 mg total) by mouth daily. 30 tablet 11  . cloNIDine (CATAPRES) 0.1 MG tablet Take 2 tablets (0.2 mg total) by mouth at bedtime. Unsure of strength 90 tablet 0  . FLUoxetine (PROZAC) 20 MG tablet Take 20 mg by mouth daily.    . naproxen (NAPROSYN) 500 MG tablet Take 1 tablet (500 mg total) by mouth 2 (two) times daily with a meal. 180 tablet 6  . omeprazole (PRILOSEC) 40 MG  capsule Take 1 capsule (40 mg total) by mouth daily. 90 capsule 1  . clonazePAM (KLONOPIN) 1 MG tablet Take 1 mg by mouth 2 (two) times daily as needed for anxiety.    . sodium chloride (OCEAN) 0.65 % SOLN nasal spray Place 1 spray into both nostrils as needed for congestion. (Patient not taking: Reported on 10/17/2018) 1 Bottle 6  . fluticasone (FLONASE) 50 MCG/ACT nasal spray Place 2 sprays into both nostrils 2 (two) times daily. (Patient not taking: Reported on 04/18/2018) 1 g 2  . ipratropium (ATROVENT) 0.03 % nasal spray Place 2 sprays into both nostrils 2 (two) times daily. (Patient not taking: Reported on 04/18/2018) 30 mL 0  . sulfamethoxazole-trimethoprim (BACTRIM DS,SEPTRA DS) 800-160 MG tablet Take 1 tablet by mouth 2 (two) times daily. 14 tablet 0  . traMADol (ULTRAM) 50 MG tablet Take 1 tablet (50 mg total) by mouth every 6 (six) hours as needed. (Patient not taking: Reported on 10/18/2017) 20 tablet 0   No facility-administered medications prior to visit.     Allergies  Allergen Reactions  . Epinephrine   . Hydrocodone Rash    ROS Review of Systems  Constitutional: Positive for appetite change (decreased ).  HENT: Negative.   Eyes: Negative.   Respiratory: Negative.   Cardiovascular: Negative.   Gastrointestinal: Positive for diarrhea (occasional ), nausea and vomiting.  Endocrine: Negative.   Genitourinary: Negative.   Musculoskeletal: Negative.   Skin: Negative.   Allergic/Immunologic: Negative.   Neurological: Negative.   Hematological: Negative.   Psychiatric/Behavioral: Negative.    Objective:    Physical Exam  Constitutional: She is oriented to person, place, and time. She appears well-developed and well-nourished.  HENT:  Head: Normocephalic and atraumatic.  Eyes: Conjunctivae are normal.  Neck: Normal range of motion. Neck supple.  Cardiovascular: Normal rate, regular rhythm, normal heart sounds and intact distal pulses.  Pulmonary/Chest: Effort normal and  breath sounds normal.  Abdominal: Soft. Bowel sounds are normal.  Musculoskeletal: Normal range of motion.  Neurological: She is alert and oriented to person, place, and time. She has normal reflexes.  Skin: Skin is warm and dry.  Psychiatric: She has a normal mood and affect. Her behavior is normal. Judgment and thought content normal.  Nursing note and vitals reviewed.   BP 126/86 (BP Location: Left Arm, Patient Position: Sitting, Cuff Size: Small)   Pulse 94   Temp 98.4 F (36.9 C) (Oral)   Ht 5\' 4"  (1.626 m)   Wt 190 lb (86.2 kg)  LMP 10/01/2018   SpO2 98%   BMI 32.61 kg/m  Wt Readings from Last 3 Encounters:  10/17/18 190 lb (86.2 kg)  04/18/18 215 lb (97.5 kg)  10/18/17 226 lb (102.5 kg)     Health Maintenance Due  Topic Date Due  . HIV Screening  05/12/1988  . TETANUS/TDAP  07/23/2018    There are no preventive care reminders to display for this patient.  Lab Results  Component Value Date   TSH 3.94 04/19/2017   Lab Results  Component Value Date   WBC 4.9 04/18/2018   HGB 9.6 (L) 04/18/2018   HCT 32.0 (L) 04/18/2018   MCV 82 04/18/2018   PLT 164 04/18/2018   Lab Results  Component Value Date   NA 139 04/18/2018   K 4.1 04/18/2018   CO2 23 04/18/2018   GLUCOSE 113 (H) 04/18/2018   BUN 8 04/18/2018   CREATININE 0.89 04/18/2018   BILITOT 0.4 04/18/2018   ALKPHOS 98 04/18/2018   AST 124 (H) 04/18/2018   ALT 54 (H) 04/18/2018   PROT 7.4 04/18/2018   ALBUMIN 4.0 04/18/2018   CALCIUM 8.8 04/18/2018   Lab Results  Component Value Date   CHOL 186 11/02/2016   Lab Results  Component Value Date   HDL 49 (L) 11/02/2016   Lab Results  Component Value Date   LDLCALC 111 (H) 11/02/2016   Lab Results  Component Value Date   TRIG 128 11/02/2016   Lab Results  Component Value Date   CHOLHDL 3.8 11/02/2016   Lab Results  Component Value Date   HGBA1C 5.0 04/18/2018   Assessment & Plan:   1. Nausea and vomiting, intractability of vomiting  not specified, unspecified vomiting type We will initiated Zofran today. We will orde ultrasound today.  - ondansetron (ZOFRAN) 4 MG tablet; Take 1 tablet (4 mg total) by mouth every 8 (eight) hours as needed for nausea or vomiting.  Dispense: 20 tablet; Refill: 2 - US Abdomen Complete; Future  2. Abdominal pain, bilateral upper quadrant - US Abdomen Complete; Future  3. Diarrhea, unspecified type We will initiate Loperamide today.  - loperamide (IMODIUM A-D) 2 MG tablet; Take 1 tablet (2 mg total) by mouth 4 (four) times daily as needed for diarrhea or loose stools.  Dispense: 30 tablet; Refill: 1  4. Essential hypertension Blood pressure is stable at 126/86 today. She will continue to decrease high sodium intake, excessive alcohol intake, increase potassium intake, smoking cessation, and increase physical activity of at least 30 minutes of cardio activity daily. She will continue to follow Heart Healthy or DASH diet.  5. Chronic seasonal allergic rhinitis due to pollen We will initiated Flonase today.  - fluticasone (FLONASE) 50 MCG/ACT nasal spray; Place 2 sprays into both nostrils daily.  Dispense: 1 g; Refill: 3 - ipratropium (ATROVENT) 0.03 % nasal spray; Place 2 sprays into both nostrils 2 (two) times daily.  Dispense: 30 mL; Refill: 3  6. Follow up She will follow up in 6 months.  - POCT urinalysis dipstick  Problem List Items Addressed This Visit      Cardiovascular and Mediastinum   Essential hypertension    Other Visit Diagnoses    Nausea and vomiting, intractability of vomiting not specified, unspecified vomiting type    -  Primary   Relevant Medications   ondansetron (ZOFRAN) 4 MG tablet   Other Relevant Orders   US Abdomen Complete   Abdominal pain, bilateral upper quadrant       Relevant  Orders   US Abdomen Complete   Diarrhea, unspecified type       Relevant Medications   loperamide (IMODIUM A-D) 2 MG tablet   Chronic seasonal allergic rhinitis due to pollen        Relevant Medications   fluticasone (FLONASE) 50 MCG/ACT nasal spray   ipratropium (ATROVENT) 0.03 % nasal spray   Follow up       Relevant Orders   POCT urinalysis dipstick (Completed)      Meds ordered this encounter  Medications  . ondansetron (ZOFRAN) 4 MG tablet    Sig: Take 1 tablet (4 mg total) by mouth every 8 (eight) hours as needed for nausea or vomiting.    Dispense:  20 tablet    Refill:  2  . fluticasone (FLONASE) 50 MCG/ACT nasal spray    Sig: Place 2 sprays into both nostrils daily.    Dispense:  1 g    Refill:  3  . ipratropium (ATROVENT) 0.03 % nasal spray    Sig: Place 2 sprays into both nostrils 2 (two) times daily.    Dispense:  30 mL    Refill:  3  . loperamide (IMODIUM A-D) 2 MG tablet    Sig: Take 1 tablet (2 mg total) by mouth 4 (four) times daily as needed for diarrhea or loose stools.    Dispense:  30 tablet    Refill:  1    Follow-up: Return in about 6 months (around 04/19/2019).    Azzie Glatter, FNP

## 2018-10-17 NOTE — Patient Instructions (Addendum)
Ondansetron tablets What is this medicine? ONDANSETRON (on DAN se tron) is used to treat nausea and vomiting caused by chemotherapy. It is also used to prevent or treat nausea and vomiting after surgery. This medicine may be used for other purposes; ask your health care provider or pharmacist if you have questions. COMMON BRAND NAME(S): Zofran What should I tell my health care provider before I take this medicine? They need to know if you have any of these conditions: -heart disease -history of irregular heartbeat -liver disease -low levels of magnesium or potassium in the blood -an unusual or allergic reaction to ondansetron, granisetron, other medicines, foods, dyes, or preservatives -pregnant or trying to get pregnant -breast-feeding How should I use this medicine? Take this medicine by mouth with a glass of water. Follow the directions on your prescription label. Take your doses at regular intervals. Do not take your medicine more often than directed. Talk to your pediatrician regarding the use of this medicine in children. Special care may be needed. Overdosage: If you think you have taken too much of this medicine contact a poison control center or emergency room at once. NOTE: This medicine is only for you. Do not share this medicine with others. What if I miss a dose? If you miss a dose, take it as soon as you can. If it is almost time for your next dose, take only that dose. Do not take double or extra doses. What may interact with this medicine? Do not take this medicine with any of the following medications: -apomorphine -certain medicines for fungal infections like fluconazole, itraconazole, ketoconazole, posaconazole, voriconazole -cisapride -dofetilide -dronedarone -pimozide -thioridazine -ziprasidone This medicine may also interact with the following medications: -carbamazepine -certain medicines for depression, anxiety, or psychotic disturbances -fentanyl -linezolid  -MAOIs like Carbex, Eldepryl, Marplan, Nardil, and Parnate -methylene blue (injected into a vein) -other medicines that prolong the QT interval (cause an abnormal heart rhythm) -phenytoin -rifampicin -tramadol This list may not describe all possible interactions. Give your health care provider a list of all the medicines, herbs, non-prescription drugs, or dietary supplements you use. Also tell them if you smoke, drink alcohol, or use illegal drugs. Some items may interact with your medicine. What should I watch for while using this medicine? Check with your doctor or health care professional right away if you have any sign of an allergic reaction. What side effects may I notice from receiving this medicine? Side effects that you should report to your doctor or health care professional as soon as possible: -allergic reactions like skin rash, itching or hives, swelling of the face, lips or tongue -breathing problems -confusion -dizziness -fast or irregular heartbeat -feeling faint or lightheaded, falls -fever and chills -loss of balance or coordination -seizures -sweating -swelling of the hands or feet -tightness in the chest -tremors -unusually weak or tired Side effects that usually do not require medical attention (report to your doctor or health care professional if they continue or are bothersome): -constipation or diarrhea -headache This list may not describe all possible side effects. Call your doctor for medical advice about side effects. You may report side effects to FDA at 1-800-FDA-1088. Where should I keep my medicine? Keep out of the reach of children. Store between 2 and 30 degrees C (36 and 86 degrees F). Throw away any unused medicine after the expiration date. NOTE: This sheet is a summary. It may not cover all possible information. If you have questions about this medicine, talk to your doctor, pharmacist,  or health care provider.  2019 Elsevier/Gold Standard  (2013-04-15 16:27:45)      Nausea and Vomiting, Adult Nausea is feeling sick to your stomach or feeling that you are about to throw up (vomit). Vomiting is when food in your stomach is thrown up and out of the mouth. Throwing up can make you feel weak. It can also make you lose too much water in your body (get dehydrated). If you lose too much water in your body, you may:  Feel tired.  Feel thirsty.  Have a dry mouth.  Have cracked lips.  Go pee (urinate) less often. Older adults and people with other diseases or a weak body defense system (immune system) are at higher risk for losing too much water in the body. If you feel sick to your stomach and you throw up, it is important to follow instructions from your doctor about how to take care of yourself. Follow these instructions at home: Watch your symptoms for any changes. Tell your doctor about them. Follow these instructions to care for yourself at home. Eating and drinking      Take an ORS (oral rehydration solution). This is a drink that is sold at pharmacies and stores.  Drink clear fluids in small amounts as you are able, such as: ? Water. ? Ice chips. ? Fruit juice that has water added (diluted fruit juice). ? Low-calorie sports drinks.  Eat bland, easy-to-digest foods in small amounts as you are able, such as: ? Bananas. ? Applesauce. ? Rice. ? Low-fat (lean) meats. ? Toast. ? Crackers.  Avoid drinking fluids that have a lot of sugar or caffeine in them. This includes energy drinks, sports drinks, and soda.  Avoid alcohol.  Avoid spicy or fatty foods. General instructions  Take over-the-counter and prescription medicines only as told by your doctor.  Drink enough fluid to keep your pee (urine) pale yellow.  Wash your hands often with soap and water. If you cannot use soap and water, use hand sanitizer.  Make sure that all people in your home wash their hands well and often.  Rest at home while you get  better.  Watch your condition for any changes.  Take slow and deep breaths when you feel sick to your stomach.  Keep all follow-up visits as told by your doctor. This is important. Contact a doctor if:  Your symptoms get worse.  You have new symptoms.  You have a fever.  You cannot drink fluids without throwing up.  You feel sick to your stomach for more than 2 days.  You feel light-headed or dizzy.  You have a headache.  You have muscle cramps.  You have a rash.  You have pain while peeing. Get help right away if:  You have pain in your chest, neck, arm, or jaw.  You feel very weak or you pass out (faint).  You throw up again and again.  You have throw up that is bright red or looks like black coffee grounds.  You have bloody or black poop (stools) or poop that looks like tar.  You have a very bad headache, a stiff neck, or both.  You have very bad pain, cramping, or bloating in your belly (abdomen).  You have trouble breathing.  You are breathing very quickly.  Your heart is beating very quickly.  Your skin feels cold and clammy.  You feel confused.  You have signs of losing too much water in your body, such as: ? Dark pee,  very little pee, or no pee. ? Cracked lips. ? Dry mouth. ? Sunken eyes. ? Sleepiness. ? Weakness. These symptoms may be an emergency. Do not wait to see if the symptoms will go away. Get medical help right away. Call your local emergency services (911 in the [place] Do not drive yourself to the hospital. Summary  Nausea is feeling sick to your stomach or feeling that you are about to throw up (vomit). Vomiting is when food in your stomach is thrown up and out of the mouth.  Follow instructions from your doctor about eating and drinking to keep from losing too much water in your body.  Take over-the-counter and prescription medicines only as told by your doctor.  Contact your doctor if your symptoms get worse or you have new  symptoms.  Keep all follow-up visits as told by your doctor. This is important. This information is not intended to replace advice given to you by your health care provider. Make sure you discuss any questions you have with your health care provider. Document Released: 12/26/2007 Document Revised: 12/17/2017 Document Reviewed: 12/17/2017 Elsevier Interactive Patient Education  2019 [employer].   Abdominal Pain, Adult  Many things can cause belly (abdominal) pain. Most times, belly pain is not dangerous. Many cases of belly pain can be watched and treated at home. Sometimes belly pain is serious, though. Your doctor will try to find the cause of your belly pain. Follow these instructions at home:  Take over-the-counter and prescription medicines only as told by your doctor. Do not take medicines that help you poop (laxatives) unless told to by your doctor.  Drink enough fluid to keep your pee (urine) clear or pale yellow.  Watch your belly pain for any changes.  Keep all follow-up visits as told by your doctor. This is important. Contact a doctor if:  Your belly pain changes or gets worse.  You are not hungry, or you lose weight without trying.  You are having trouble pooping (constipated) or have watery poop (diarrhea) for more than 2-3 days.  You have pain when you pee or poop.  Your belly pain wakes you up at night.  Your pain gets worse with meals, after eating, or with certain foods.  You are throwing up and cannot keep anything down.  You have a fever. Get help right away if:  Your pain does not go away as soon as your doctor says it should.  You cannot stop throwing up.  Your pain is only in areas of your belly, such as the right side or the left lower part of the belly.  You have bloody or black poop, or poop that looks like tar.  You have very bad pain, cramping, or bloating in your belly.  You have signs of not having enough fluid or water in your body  (dehydration), such as: ? Dark pee, very little pee, or no pee. ? Cracked lips. ? Dry mouth. ? Sunken eyes. ? Sleepiness. ? Weakness. This information is not intended to replace advice given to you by your health care provider. Make sure you discuss any questions you have with your health care provider. Document Released: 12/26/2007 Document Revised: 01/27/2016 Document Reviewed: 12/21/2015 Elsevier Interactive Patient Education  2019 Elsevier Inc.  Diarrhea, Adult Diarrhea is when you pass loose and watery poop (stool) often. Diarrhea can make you feel weak and cause you to lose water in your body (get dehydrated). Losing water in your body can cause you to:  Feel tired and thirsty.  Have a dry mouth.  Go pee (urinate) less often. Diarrhea often lasts 2-3 days. However, it can last longer if it is a sign of something more serious. It is important to treat your diarrhea as told by your doctor. Follow these instructions at home: Eating and drinking     Follow these instructions as told by your doctor:  Take an ORS (oral rehydration solution). This is a drink that helps you replace fluids and minerals your body lost. It is sold at pharmacies and stores.  Drink plenty of fluids, such as: ? Water. ? Ice chips. ? Diluted fruit juice. ? Low-calorie sports drinks. ? Milk, if you want.  Avoid drinking fluids that have a lot of sugar or caffeine in them.  Eat bland, easy-to-digest foods in small amounts as you are able. These foods include: ? Bananas. ? Applesauce. ? Rice. ? Low-fat (lean) meats. ? Toast. ? Crackers.  Avoid alcohol.  Avoid spicy or fatty foods.  Medicines  Take over-the-counter and prescription medicines only as told by your doctor.  If you were prescribed an antibiotic medicine, take it as told by your doctor. Do not stop using the antibiotic even if you start to feel better. General instructions   Wash your hands often using soap and water. If soap  and water are not available, use a hand sanitizer. Others in your home should wash their hands as well. Hands should be washed: ? After using the toilet or changing a diaper. ? Before preparing, cooking, or serving food. ? While caring for a sick person. ? While visiting someone in a hospital.  Drink enough fluid to keep your pee (urine) pale yellow.  Rest at home while you get better.  Watch your condition for any changes.  Take a warm bath to help with any burning or pain from having diarrhea.  Keep all follow-up visits as told by your doctor. This is important. Contact a doctor if:  You have a fever.  Your diarrhea gets worse.  You have new symptoms.  You cannot keep fluids down.  You feel light-headed or dizzy.  You have a headache.  You have muscle cramps. Get help right away if:  You have chest pain.  You feel very weak or you pass out (faint).  You have bloody or black poop or poop that looks like tar.  You have very bad pain, cramping, or bloating in your belly (abdomen).  You have trouble breathing or you are breathing very quickly.  Your heart is beating very quickly.  Your skin feels cold and clammy.  You feel confused.  You have signs of losing too much water in your body, such as: ? Dark pee, very little pee, or no pee. ? Cracked lips. ? Dry mouth. ? Sunken eyes. ? Sleepiness. ? Weakness. Summary  Diarrhea is when you pass loose and watery poop (stool) often.  Diarrhea can make you feel weak and cause you to lose water in your body (get dehydrated).  Take an ORS (oral rehydration solution). This is a drink that is sold at pharmacies and stores.  Eat bland, easy-to-digest foods in small amounts as you are able.  Contact a doctor if your condition gets worse. Get help right away if you have signs that you have lost too much water in your body. This information is not intended to replace advice given to you by your health care provider. Make  sure you discuss any questions you have  with your health care provider. Document Released: 12/26/2007 Document Revised: 12/13/2017 Document Reviewed: 12/13/2017 Elsevier Interactive Patient Education  2019 [employer].

## 2018-10-22 ENCOUNTER — Other Ambulatory Visit: Payer: Self-pay

## 2018-10-22 ENCOUNTER — Ambulatory Visit (HOSPITAL_COMMUNITY)
Admission: RE | Admit: 2018-10-22 | Discharge: 2018-10-22 | Disposition: A | Discharge status: Home / Self Care | Payer: Self-pay | Source: Ambulatory Visit | Attending: Family Medicine | Admitting: Family Medicine

## 2018-10-22 DIAGNOSIS — R112 Nausea with vomiting, unspecified: Secondary | ICD-10-CM | POA: Insufficient documentation

## 2018-10-22 DIAGNOSIS — R1012 Left upper quadrant pain: Secondary | ICD-10-CM | POA: Insufficient documentation

## 2018-10-22 DIAGNOSIS — R1011 Right upper quadrant pain: Secondary | ICD-10-CM | POA: Insufficient documentation

## 2018-10-27 ENCOUNTER — Telehealth: Payer: Self-pay | Admitting: Family Medicine

## 2018-10-27 NOTE — Telephone Encounter (Signed)
Results of Abdominal Ultrasound left on patient's voicemail today.

## 2018-11-13 ENCOUNTER — Other Ambulatory Visit: Payer: Self-pay

## 2018-11-13 DIAGNOSIS — J302 Other seasonal allergic rhinitis: Secondary | ICD-10-CM

## 2018-11-13 DIAGNOSIS — J301 Allergic rhinitis due to pollen: Secondary | ICD-10-CM

## 2018-11-13 DIAGNOSIS — M109 Gout, unspecified: Secondary | ICD-10-CM

## 2018-11-13 DIAGNOSIS — I10 Essential (primary) hypertension: Secondary | ICD-10-CM

## 2018-11-13 DIAGNOSIS — E785 Hyperlipidemia, unspecified: Secondary | ICD-10-CM

## 2018-11-13 DIAGNOSIS — K219 Gastro-esophageal reflux disease without esophagitis: Secondary | ICD-10-CM

## 2018-11-13 MED ORDER — ATORVASTATIN CALCIUM 40 MG PO TABS: 40.0000 mg | ORAL_TABLET | Freq: Every day | ORAL | 1 refills | Status: DC

## 2018-11-13 MED ORDER — FLUTICASONE PROPIONATE 50 MCG/ACT NA SUSP: 2.0000 | Freq: Every day | NASAL | 3 refills | Status: DC

## 2018-11-13 MED ORDER — CETIRIZINE HCL 10 MG PO TABS: 10.0000 mg | ORAL_TABLET | Freq: Every day | ORAL | 11 refills | Status: DC

## 2018-11-13 MED ORDER — NAPROXEN 500 MG PO TABS: 500.0000 mg | ORAL_TABLET | Freq: Two times a day (BID) | ORAL | 6 refills | Status: DC

## 2018-11-13 MED ORDER — OMEPRAZOLE 40 MG PO CPDR: 40.0000 mg | DELAYED_RELEASE_CAPSULE | Freq: Every day | ORAL | 1 refills | Status: DC

## 2018-11-13 MED ORDER — CLONIDINE HCL 0.1 MG PO TABS: 0.2000 mg | ORAL_TABLET | Freq: Every day | ORAL | 0 refills | Status: DC

## 2018-11-21 NOTE — Telephone Encounter (Signed)
Message sent to provider 

## 2018-11-27 NOTE — Telephone Encounter (Signed)
Message sent to provider 

## 2018-12-09 ENCOUNTER — Encounter: Payer: Self-pay | Admitting: Family Medicine

## 2018-12-09 ENCOUNTER — Ambulatory Visit (INDEPENDENT_AMBULATORY_CARE_PROVIDER_SITE_OTHER): Payer: Self-pay | Admitting: Family Medicine

## 2018-12-09 ENCOUNTER — Other Ambulatory Visit: Payer: Self-pay

## 2018-12-09 VITALS — BP 120/78 | HR 76 | Temp 98.3°F | Ht 64.0 in | Wt 184.0 lb

## 2018-12-09 DIAGNOSIS — Z23 Encounter for immunization: Secondary | ICD-10-CM

## 2018-12-09 DIAGNOSIS — Z09 Encounter for follow-up examination after completed treatment for conditions other than malignant neoplasm: Secondary | ICD-10-CM

## 2018-12-09 DIAGNOSIS — H539 Unspecified visual disturbance: Secondary | ICD-10-CM

## 2018-12-09 DIAGNOSIS — I10 Essential (primary) hypertension: Secondary | ICD-10-CM

## 2018-12-09 DIAGNOSIS — H579 Unspecified disorder of eye and adnexa: Secondary | ICD-10-CM

## 2018-12-09 NOTE — Progress Notes (Signed)
Patient [facility]   Established Patient Office Visit  Subjective:  Patient ID: Kara Torres, female    DOB: 21-Jul-1971  Age: 48 y.o. MRN: [id number]  CC:  Chief Complaint  Patient presents with  . visual changes  . Referral    HPI Kara Torres is a 48 year old female who presents for Sick Visit today.   Past Medical History:  Diagnosis Date  . Allergy   . Alpha-1-antitrypsin deficiency ([place])   . Anxiety   . Asthma   . Cataracts, bilateral   . Depression   . History of cholecystectomy   . Liver disease    Current Status: Since her last office visit, she has c/o worsening Cataracts X 2 months now, with blurry vision, cloudy vision, and trouble focusing. She reports that her vision is worse at night while driving. She has not not used any drops for eyes. She has not been able to follow up with her Optometrist because office is currently not seeing patients because of [place].   She denies fevers, chills, fatigue, recent infections, weight loss, and night sweats. She has not had any headaches, visual changes, dizziness, and falls. No chest pain, heart palpitations, cough and shortness of breath reported. No reports of GI problems such as nausea, vomiting, diarrhea, and constipation. She has no reports of blood in stools, dysuria and hematuria. No depression or anxiety reported.    Past Surgical History:  Procedure Laterality Date  . CHOLECYSTECTOMY  2000    Family History  Problem Relation Age of Onset  . Stroke Mother   . Hyperlipidemia Mother   . Diabetes Mother   . Diabetes Father   . Hypertension Father   . Hyperlipidemia Father   . Mental illness Father   . Stroke Father   . Diabetes Maternal Grandmother   . Heart disease Maternal Grandmother   . Hypertension Maternal Grandmother   . Heart attack Maternal Grandmother     Social History   Socioeconomic History  . Marital status: Divorced    Spouse name:  Not on file  . Number of children: Not on file  . Years of education: Not on file  . Highest education level: Not on file  Occupational History  . Occupation: Therapist, art  Social Needs  . Financial resource strain: Not on file  . Food insecurity:    Worry: Not on file    Inability: Not on file  . Transportation needs:    Medical: Not on file    Non-medical: Not on file  Tobacco Use  . Smoking status: Former Research scientist (life sciences)  . Smokeless tobacco: Never Used  Substance and Sexual Activity  . Alcohol use: Yes    Alcohol/week: 2.0 standard drinks    Types: 2 Cans of beer per week    Comment: occ  . Drug use: No  . Sexual activity: Not on file  Lifestyle  . Physical activity:    Days per week: Not on file    Minutes per session: Not on file  . Stress: Not on file  Relationships  . Social connections:    Talks on phone: Not on file    Gets together: Not on file    Attends religious service: Not on file    Active member of club or organization: Not on file    Attends meetings of clubs or organizations: Not on file    Relationship status: Not on file  . Intimate partner violence:  Fear of current or ex partner: Not on file    Emotionally abused: Not on file    Physically abused: Not on file    Forced sexual activity: Not on file  Other Topics Concern  . Not on file  Social History Narrative  . Not on file    Outpatient Medications Prior to Visit  Medication Sig Dispense Refill  . atorvastatin (LIPITOR) 40 MG tablet Take 1 tablet (40 mg total) by mouth daily. 90 tablet 1  . cetirizine (ZYRTEC) 10 MG tablet Take 1 tablet (10 mg total) by mouth daily. 30 tablet 11  . cloNIDine (CATAPRES) 0.1 MG tablet Take 2 tablets (0.2 mg total) by mouth at bedtime. Unsure of strength 90 tablet 0  . FLUoxetine (PROZAC) 20 MG tablet Take 20 mg by mouth daily.    Marland Kitchen omeprazole (PRILOSEC) 40 MG capsule Take 1 capsule (40 mg total) by mouth daily. 90 capsule 1  . fluticasone (FLONASE) 50 MCG/ACT  nasal spray Place 2 sprays into both nostrils daily. (Patient not taking: Reported on 12/09/2018) 1 g 3  . ipratropium (ATROVENT) 0.03 % nasal spray Place 2 sprays into both nostrils 2 (two) times daily. (Patient not taking: Reported on 12/09/2018) 30 mL 3  . loperamide (IMODIUM A-D) 2 MG tablet Take 1 tablet (2 mg total) by mouth 4 (four) times daily as needed for diarrhea or loose stools. (Patient not taking: Reported on 12/09/2018) 30 tablet 1  . naproxen (NAPROSYN) 500 MG tablet Take 1 tablet (500 mg total) by mouth 2 (two) times daily with a meal. (Patient not taking: Reported on 12/09/2018) 180 tablet 6  . ondansetron (ZOFRAN) 4 MG tablet Take 1 tablet (4 mg total) by mouth every 8 (eight) hours as needed for nausea or vomiting. (Patient not taking: Reported on 12/09/2018) 20 tablet 2  . sodium chloride (OCEAN) 0.65 % SOLN nasal spray Place 1 spray into both nostrils as needed for congestion. (Patient not taking: Reported on 10/17/2018) 1 Bottle 6   No facility-administered medications prior to visit.     Allergies  Allergen Reactions  . Epinephrine   . Hydrocodone Rash    ROS Review of Systems  Constitutional: Negative.   HENT: Negative.   Eyes: Positive for visual disturbance (blurry, cloudy, hard to focus).  Respiratory: Negative.   Cardiovascular: Negative.   Gastrointestinal: Negative.   Endocrine: Negative.   Genitourinary: Negative.   Musculoskeletal: Negative.   Skin: Negative.   Allergic/Immunologic: Negative.   Neurological: Positive for dizziness and headaches.  Hematological: Negative.   Psychiatric/Behavioral: Negative.       Objective:    Physical Exam  Constitutional: She is oriented to person, place, and time. She appears well-developed and well-nourished.  HENT:  Head: Normocephalic and atraumatic.  Eyes: Conjunctivae are normal.  Right eye: 20/30 Left eye: 20/200  Neck: Normal range of motion. Neck supple.  Cardiovascular: Normal rate, regular rhythm and  intact distal pulses.  Pulmonary/Chest: Effort normal and breath sounds normal.  Abdominal: Soft. Bowel sounds are normal.  Musculoskeletal: Normal range of motion.  Neurological: She is alert and oriented to person, place, and time. She has normal reflexes.  Skin: Skin is warm and dry.  Psychiatric: She has a normal mood and affect. Her behavior is normal. Judgment and thought content normal.  Nursing note and vitals reviewed.   BP 120/78 (BP Location: Left Arm, Patient Position: Sitting, Cuff Size: Small)   Pulse 76   Temp 98.3 F (36.8 C) (Oral)   Ht  5\' 4"  (1.626 m)   Wt 184 lb (83.5 kg)   LMP 12/01/2018   SpO2 99%   BMI 31.58 kg/m  Wt Readings from Last 3 Encounters:  12/09/18 184 lb (83.5 kg)  10/17/18 190 lb (86.2 kg)  04/18/18 215 lb (97.5 kg)     Health Maintenance Due  Topic Date Due  . HIV Screening  05/12/1988    There are no preventive care reminders to display for this patient.  Lab Results  Component Value Date   TSH 3.94 04/19/2017   Lab Results  Component Value Date   WBC 4.9 04/18/2018   HGB 9.6 (L) 04/18/2018   HCT 32.0 (L) 04/18/2018   MCV 82 04/18/2018   PLT 164 04/18/2018   Lab Results  Component Value Date   NA 139 04/18/2018   K 4.1 04/18/2018   CO2 23 04/18/2018   GLUCOSE 113 (H) 04/18/2018   BUN 8 04/18/2018   CREATININE 0.89 04/18/2018   BILITOT 0.4 04/18/2018   ALKPHOS 98 04/18/2018   AST 124 (H) 04/18/2018   ALT 54 (H) 04/18/2018   PROT 7.4 04/18/2018   ALBUMIN 4.0 04/18/2018   CALCIUM 8.8 04/18/2018   Lab Results  Component Value Date   CHOL 186 11/02/2016   Lab Results  Component Value Date   HDL 49 (L) 11/02/2016   Lab Results  Component Value Date   LDLCALC 111 (H) 11/02/2016   Lab Results  Component Value Date   TRIG 128 11/02/2016   Lab Results  Component Value Date   CHOLHDL 3.8 11/02/2016   Lab Results  Component Value Date   HGBA1C 5.0 04/18/2018      Assessment & Plan:   1. Visual  disturbance Worsening. We contacted Optometry today. Patient is scheduled for 01/08/2019.   2. Eye exam abnormal Vision is 20/30 and 20/200    3. Need for Tdap vaccination - Tdap vaccine greater than or equal to 7yo IM  4. Essential hypertension The current medical regimen is effective; blood pressure stable at 120/78 today; continue present plan and medications as prescribed. She will continue to decrease high sodium intake, excessive alcohol intake, increase potassium intake, smoking cessation, and increase physical activity of at least 30 minutes of cardio activity daily. She will continue to follow Heart Healthy or DASH diet.  5. Follow up She will follow up in 6 months.  Keep follow up with Optometry on 01/08/2019.  No orders of the defined types were placed in this encounter.   Orders Placed This Encounter  Procedures  . Tdap vaccine greater than or equal to 7yo IM    Referral Orders  No referral(s) requested today   Kathe Becton,  MSN, FNP-C [facility] [address] [place], [place] [address] [phone]    Problem List Items Addressed This Visit      Cardiovascular and Mediastinum   Essential hypertension    Other Visit Diagnoses    Visual disturbance    -  Primary   Eye exam abnormal       Need for Tdap vaccination       Relevant Orders   Tdap vaccine greater than or equal to 7yo IM (Completed)   Follow up          No orders of the defined types were placed in this encounter.   Follow-up: Return in about 6 months (around 06/11/2019).    Azzie Glatter, FNP

## 2018-12-10 DIAGNOSIS — H579 Unspecified disorder of eye and adnexa: Secondary | ICD-10-CM

## 2018-12-10 DIAGNOSIS — H539 Unspecified visual disturbance: Secondary | ICD-10-CM

## 2018-12-10 HISTORY — DX: Unspecified visual disturbance: H53.9

## 2018-12-10 HISTORY — DX: Unspecified disorder of eye and adnexa: H57.9

## 2018-12-22 ENCOUNTER — Telehealth: Payer: Self-pay

## 2018-12-22 NOTE — Telephone Encounter (Signed)
This patient sees [place].

## 2018-12-23 NOTE — Telephone Encounter (Signed)
Patient given information to [facility] and [place] dentist that accepts [employer]

## 2018-12-23 NOTE — Telephone Encounter (Signed)
Left a vm for patient to callback 

## 2019-01-19 ENCOUNTER — Ambulatory Visit: Payer: No Typology Code available for payment source | Admitting: Family Medicine

## 2019-03-23 ENCOUNTER — Encounter: Payer: Self-pay | Admitting: Physician Assistant

## 2019-04-06 ENCOUNTER — Encounter: Payer: Self-pay | Admitting: Physician Assistant

## 2019-04-06 ENCOUNTER — Other Ambulatory Visit: Payer: Self-pay

## 2019-04-06 ENCOUNTER — Ambulatory Visit (INDEPENDENT_AMBULATORY_CARE_PROVIDER_SITE_OTHER): Payer: No Typology Code available for payment source | Admitting: Physician Assistant

## 2019-04-06 VITALS — BP 128/70 | HR 102 | Temp 97.8°F | Ht 64.0 in | Wt 174.0 lb

## 2019-04-06 DIAGNOSIS — R109 Unspecified abdominal pain: Secondary | ICD-10-CM

## 2019-04-06 DIAGNOSIS — R112 Nausea with vomiting, unspecified: Secondary | ICD-10-CM

## 2019-04-06 DIAGNOSIS — R194 Change in bowel habit: Secondary | ICD-10-CM

## 2019-04-06 MED ORDER — OMEPRAZOLE 40 MG PO CPDR: 40.0000 mg | DELAYED_RELEASE_CAPSULE | Freq: Two times a day (BID) | ORAL | 2 refills | Status: DC

## 2019-04-06 MED ORDER — SUPREP BOWEL PREP KIT 17.5-3.13-1.6 GM/177ML PO SOLN: 1.0000 | ORAL | 0 refills | Status: DC

## 2019-04-06 NOTE — Progress Notes (Signed)
Reviewed. I agree with documentation including the assessment and plan. EGD and colonoscopy recommended.   Glenys Snader L. Tarri Glenn, MD, MPH

## 2019-04-06 NOTE — Progress Notes (Signed)
Chief Complaint: Nausea, abdominal pain, weight loss and bloating  HPI:    Kara Torres is a 48 year old Caucasian female, previously known to Dr. Olevia Perches, who was referred to me by Azzie Glatter, FNP for a complaint of nausea, weight loss, abdominal pain and bloating.      06/16/2013 flex sig for rectal hemorrhage by Dr. Wyona Almas at [facility] with nonbleeding internal hemorrhoids as well as fissure.    12/31/2014 seen in clinic for abnormal liver function test.  Thought due to fatty liver disease.    10/22/2018 abdominal ultrasound increased hepatic parenchymal echogenicity compatible with steatosis.  Mild nodularity of the hepatic contour raising the possibility of early cirrhotic change.  Splenomegaly and prior cholecystectomy.    Today, the patient presents to clinic and has her ex-husband on the speaker phone who does assist with history.  She describes that since December 2019 she has been "off".  It all started with having no appetite and then vomiting shortly after she swallowed any food.  Due to this she has lost about 50 pounds since April of this year.  Most recently she has discovered that she can eat fruit and veggies without vomiting, but anything else will last maybe an hour and then comes back up.  Has been supplementing with Boost and Ensure shakes for protein. Associated with an increase in belching burping, gas and bloating.  Her ex-husband adds that the vomiting episodes can last for 40 minutes and sometimes she is just dry heaving and nothing seems to make it stop.    Also describes abdominal pain which can be felt in the epigastrium but also in her lower abdomen which is just "unbearable".  This is at least a 10 /10 and is worse typically after eating.  Has noticed a change in her bowel habits describing them as a "milkshake consistency" these vary in color from "black to gray".  Currently she is using Omeprazole 40 mg daily and without this "it would be even worse".  Has  also started using Imodium which helps some.  Also occasionally uses Zofran which can abort severe vomiting symptoms.    Family history of colon cancer on her father's side in her aunt and uncle as well as great-grandmother.  Social history significant for not having insurance.  Her ex-husband is paying for this appointment.    Denies fever or chills.  Past Medical History:  Diagnosis Date  . Allergy   . Alpha-1-antitrypsin deficiency ([place])   . Anxiety   . Asthma   . Cataracts, bilateral   . Depression   . History of cholecystectomy   . Liver disease     Past Surgical History:  Procedure Laterality Date  . CHOLECYSTECTOMY  2000    Current Outpatient Medications  Medication Sig Dispense Refill  . atorvastatin (LIPITOR) 40 MG tablet Take 1 tablet (40 mg total) by mouth daily. 90 tablet 1  . cetirizine (ZYRTEC) 10 MG tablet Take 1 tablet (10 mg total) by mouth daily. 30 tablet 11  . cloNIDine (CATAPRES) 0.1 MG tablet Take 2 tablets (0.2 mg total) by mouth at bedtime. Unsure of strength 90 tablet 0  . FLUoxetine (PROZAC) 20 MG tablet Take 20 mg by mouth daily.    . fluticasone (FLONASE) 50 MCG/ACT nasal spray Place 2 sprays into both nostrils daily. 1 g 3  . ipratropium (ATROVENT) 0.03 % nasal spray Place 2 sprays into both nostrils 2 (two) times daily. 30 mL 3  . loperamide (IMODIUM A-D) 2  MG tablet Take 1 tablet (2 mg total) by mouth 4 (four) times daily as needed for diarrhea or loose stools. 30 tablet 1  . naproxen (NAPROSYN) 500 MG tablet Take 1 tablet (500 mg total) by mouth 2 (two) times daily with a meal. 180 tablet 6  . omeprazole (PRILOSEC) 40 MG capsule Take 1 capsule (40 mg total) by mouth daily. 90 capsule 1  . ondansetron (ZOFRAN) 4 MG tablet Take 1 tablet (4 mg total) by mouth every 8 (eight) hours as needed for nausea or vomiting. 20 tablet 2   No current facility-administered medications for this visit.     Allergies as of 04/06/2019 - Review Complete 12/09/2018   Allergen Reaction Noted  . Epinephrine  12/25/2014  . Hydrocodone Rash 12/25/2014    Family History  Problem Relation Age of Onset  . Stroke Mother   . Hyperlipidemia Mother   . Diabetes Mother   . Diabetes Father   . Hypertension Father   . Hyperlipidemia Father   . Mental illness Father   . Stroke Father   . Diabetes Maternal Grandmother   . Heart disease Maternal Grandmother   . Hypertension Maternal Grandmother   . Heart attack Maternal Grandmother     Social History   Socioeconomic History  . Marital status: Divorced    Spouse name: Not on file  . Number of children: Not on file  . Years of education: Not on file  . Highest education level: Not on file  Occupational History  . Occupation: Therapist, art  Social Needs  . Financial resource strain: Not on file  . Food insecurity    Worry: Not on file    Inability: Not on file  . Transportation needs    Medical: Not on file    Non-medical: Not on file  Tobacco Use  . Smoking status: Former Research scientist (life sciences)  . Smokeless tobacco: Never Used  Substance and Sexual Activity  . Alcohol use: Yes    Alcohol/week: 2.0 standard drinks    Types: 2 Cans of beer per week    Comment: occ  . Drug use: No  . Sexual activity: Not on file  Lifestyle  . Physical activity    Days per week: Not on file    Minutes per session: Not on file  . Stress: Not on file  Relationships  . Social Herbalist on phone: Not on file    Gets together: Not on file    Attends religious service: Not on file    Active member of club or organization: Not on file    Attends meetings of clubs or organizations: Not on file    Relationship status: Not on file  . Intimate partner violence    Fear of current or ex partner: Not on file    Emotionally abused: Not on file    Physically abused: Not on file    Forced sexual activity: Not on file  Other Topics Concern  . Not on file  Social History Narrative  . Not on file    Review of  Systems:    Constitutional: No fever or chills Skin: No rash  Cardiovascular: No chest pain Respiratory: No SOB  Gastrointestinal: See HPI and otherwise negative Genitourinary: No dysuria  Neurological: No headache, dizziness or syncope Musculoskeletal: No new muscle or joint pain Hematologic: No bleeding  Psychiatric: +anxiety   Physical Exam:  Vital signs: BP 128/70 (BP Location: Left Arm, Patient Position: Sitting)   Pulse Marland Kitchen)  102   Temp 97.8 F (36.6 C) (Temporal)   Ht 5\' 4"  (1.626 m)   Wt 174 lb (78.9 kg)   SpO2 96%   BMI 29.87 kg/m   Constitutional: Caucasian female appears to be in NAD, Well developed, Well nourished, alert and cooperative Head:  Normocephalic and atraumatic. Eyes:   PEERL, EOMI. No icterus. Conjunctiva pink. Ears:  Normal auditory acuity. Neck:  Supple Throat: Oral cavity and pharynx without inflammation, swelling or lesion.  Respiratory: Respirations even and unlabored. Lungs clear to auscultation bilaterally.   No wheezes, crackles, or rhonchi.  Cardiovascular: Normal S1, S2. No MRG. Regular rate and rhythm. No peripheral edema, cyanosis or pallor.  Gastrointestinal:  Soft, nondistended, mild generalized ttp, worse in the epigastrum, No rebound or guarding. Normal bowel sounds. No appreciable masses or hepatomegaly. Rectal:  Not performed.  Msk:  Symmetrical without gross deformities. Without edema, no deformity or joint abnormality.  Neurologic:  Alert and  oriented x4;  grossly normal neurologically.  Skin:   Dry and intact without significant lesions or rashes. Psychiatric: Demonstrates good judgement and reason without abnormal affect or behaviors.  No recent labs, Imaging in HPI.  Assessment: 1.  Generalized abdominal pain: After eating anything 2.  Weight loss: 50 pound weight loss per patient 3.  Nausea and vomiting: With everything thing she eats over the past 9 months, 50 pound weight loss 4.  GERD: Moderately controlled with Omeprazole  40 daily  Plan: 1.  Scheduled patient for an EGD and Colonoscopy in the [place] with Dr. Tarri Glenn.  Did discuss risks, benefits, limitations and alternatives and the patient agrees to proceed. 2.  Increased patient's Omeprazole to 40 mg twice daily #60 with 3 refills. 3.  Patient may benefit from further labs and testing, but will start with procedures as above.  Especially since she has no insurance and is trying to get the most answers out of the least testing. 4.  Patient to follow in clinic per recommendations from Dr. Tarri Glenn after time of procedures.  Kara Newer, PA-C [facility] 04/06/2019, 10:37 AM  Cc: Azzie Glatter, FNP

## 2019-04-06 NOTE — Patient Instructions (Signed)
If you are age 46 or older, your body mass index should be between 23-30. Your Body mass index is 29.87 kg/m. If this is out of the aforementioned range listed, please consider follow up with your Primary Care Provider.  If you are age 21 or younger, your body mass index should be between 19-25. Your Body mass index is 29.87 kg/m. If this is out of the aformentioned range listed, please consider follow up with your Primary Care Provider.    You have been scheduled for an endoscopy and colonoscopy. Please follow the written instructions given to you at your visit today. Please pick up your prep supplies at the pharmacy within the next 1-3 days. If you use inhalers (even only as needed), please bring them with you on the day of your procedure.  We have sent the following medications to your pharmacy for you to pick up at your convenience: Omperazole ( twice daily)  Suprep   Thank you for choosing me and [facility].  Dennison Bulla

## 2019-04-20 ENCOUNTER — Ambulatory Visit: Payer: No Typology Code available for payment source | Admitting: Family Medicine

## 2019-04-22 ENCOUNTER — Telehealth: Payer: Self-pay | Admitting: Physician Assistant

## 2019-04-22 NOTE — Telephone Encounter (Signed)
Pt is scheduled for EGD/col 04/28/19 and asked if we have any Suprep samples available.

## 2019-04-23 NOTE — Telephone Encounter (Signed)
She agreed to come by the office and pick up paperwork .

## 2019-04-23 NOTE — Telephone Encounter (Signed)
Patient switched to a Miralax prep as we do not have anymore Suprep at the moment

## 2019-04-27 ENCOUNTER — Telehealth: Payer: Self-pay | Admitting: Unknown Physician Specialty

## 2019-04-27 NOTE — Telephone Encounter (Signed)

## 2019-04-28 ENCOUNTER — Other Ambulatory Visit: Payer: Self-pay | Admitting: *Deleted

## 2019-04-28 ENCOUNTER — Encounter: Payer: Self-pay | Admitting: Gastroenterology

## 2019-04-28 ENCOUNTER — Ambulatory Visit (AMBULATORY_SURGERY_CENTER): Payer: Self-pay | Admitting: Gastroenterology

## 2019-04-28 ENCOUNTER — Other Ambulatory Visit: Payer: Self-pay

## 2019-04-28 ENCOUNTER — Other Ambulatory Visit (INDEPENDENT_AMBULATORY_CARE_PROVIDER_SITE_OTHER): Payer: No Typology Code available for payment source

## 2019-04-28 VITALS — BP 102/72 | HR 80 | Temp 98.5°F | Resp 17 | Ht 64.0 in | Wt 174.0 lb

## 2019-04-28 DIAGNOSIS — R112 Nausea with vomiting, unspecified: Secondary | ICD-10-CM

## 2019-04-28 DIAGNOSIS — K3189 Other diseases of stomach and duodenum: Secondary | ICD-10-CM

## 2019-04-28 DIAGNOSIS — I8510 Secondary esophageal varices without bleeding: Secondary | ICD-10-CM

## 2019-04-28 DIAGNOSIS — K635 Polyp of colon: Secondary | ICD-10-CM

## 2019-04-28 DIAGNOSIS — R1084 Generalized abdominal pain: Secondary | ICD-10-CM

## 2019-04-28 DIAGNOSIS — K449 Diaphragmatic hernia without obstruction or gangrene: Secondary | ICD-10-CM

## 2019-04-28 DIAGNOSIS — D124 Benign neoplasm of descending colon: Secondary | ICD-10-CM

## 2019-04-28 DIAGNOSIS — K2970 Gastritis, unspecified, without bleeding: Secondary | ICD-10-CM

## 2019-04-28 LAB — CBC WITH DIFFERENTIAL/PLATELET
Basophils Absolute: 0 10*3/uL (ref 0.0–0.1)
Basophils Relative: 0.8 % (ref 0.0–3.0)
Eosinophils Absolute: 0.1 10*3/uL (ref 0.0–0.7)
Eosinophils Relative: 1 % (ref 0.0–5.0)
HCT: 30.7 % — ABNORMAL LOW (ref 36.0–46.0)
Hemoglobin: 9.3 g/dL — ABNORMAL LOW (ref 12.0–15.0)
Lymphocytes Relative: 17.3 % (ref 12.0–46.0)
Lymphs Abs: 0.9 10*3/uL (ref 0.7–4.0)
MCHC: 30.3 g/dL (ref 30.0–36.0)
MCV: 77.1 fl — ABNORMAL LOW (ref 78.0–100.0)
Monocytes Absolute: 0.4 10*3/uL (ref 0.1–1.0)
Monocytes Relative: 7.5 % (ref 3.0–12.0)
Neutro Abs: 4 10*3/uL (ref 1.4–7.7)
Neutrophils Relative %: 73.4 % (ref 43.0–77.0)
Platelets: 171 10*3/uL (ref 150.0–400.0)
RBC: 3.98 Mil/uL (ref 3.87–5.11)
RDW: 19.2 % — ABNORMAL HIGH (ref 11.5–15.5)
WBC: 5.4 10*3/uL (ref 4.0–10.5)

## 2019-04-28 LAB — COMPREHENSIVE METABOLIC PANEL
ALT: 47 U/L — ABNORMAL HIGH (ref 0–35)
AST: 88 U/L — ABNORMAL HIGH (ref 0–37)
Albumin: 3.3 g/dL — ABNORMAL LOW (ref 3.5–5.2)
Alkaline Phosphatase: 169 U/L — ABNORMAL HIGH (ref 39–117)
BUN: 4 mg/dL — ABNORMAL LOW (ref 6–23)
CO2: 27 mEq/L (ref 19–32)
Calcium: 8.6 mg/dL (ref 8.4–10.5)
Chloride: 101 mEq/L (ref 96–112)
Creatinine, Ser: 0.6 mg/dL (ref 0.40–1.20)
GFR: 107.65 mL/min (ref >60.00)
Glucose, Bld: 102 mg/dL — ABNORMAL HIGH (ref 70–99)
Potassium: 3.4 mEq/L — ABNORMAL LOW (ref 3.5–5.1)
Sodium: 137 mEq/L (ref 135–145)
Total Bilirubin: 1.3 mg/dL — ABNORMAL HIGH (ref 0.2–1.2)
Total Protein: 7.3 g/dL (ref 6.0–8.3)

## 2019-04-28 LAB — PROTIME-INR
INR: 1.4 ratio — ABNORMAL HIGH (ref 0.8–1.0)
Prothrombin Time: 16 s — ABNORMAL HIGH (ref 9.6–13.1)

## 2019-04-28 MED ORDER — SODIUM CHLORIDE 0.9 % IV SOLN: 500.0000 mL | Freq: Once | INTRAVENOUS | Status: DC

## 2019-04-28 NOTE — Progress Notes (Signed)
Pt arrived to RR with c/o having to "go to the bathroom" pt passed gas but still wanted to go to the bathroom to have a bm despite instructions to pass the gas and that I could only get her up after 20 min. Pt refused bedpan, pt was given levsin as ordered at 1440.  Pt abdomen was firm and distended, however, pt states this is pre-procedure norm for her.  At 1455 pt was up to BR, passed gas on toilet and stated abdomen felt much better and no further pain.  Pt left for labs after instructions.  Pt also given handout for hiatal hernia and high fiber diet and instructed on strategies to prevent constipation and decrease problems from hiatal hernia.

## 2019-04-28 NOTE — Patient Instructions (Signed)
Handouts given for diverticulosis, polyps and hemorrhoids.  YOU HAD AN ENDOSCOPIC PROCEDURE TODAY AT [facility]:   Refer to the procedure report that was given to you for any specific questions about what was found during the examination.  If the procedure report does not answer your questions, please call your gastroenterologist to clarify.  If you requested that your care partner not be given the details of your procedure findings, then the procedure report has been included in a sealed envelope for you to review at your convenience later.  YOU SHOULD EXPECT: Some feelings of bloating in the abdomen. Passage of more gas than usual.  Walking can help get rid of the air that was put into your GI tract during the procedure and reduce the bloating. If you had a lower endoscopy (such as a colonoscopy or flexible sigmoidoscopy) you may notice spotting of blood in your stool or on the toilet paper. If you underwent a bowel prep for your procedure, you may not have a normal bowel movement for a few days.  Please Note:  You might notice some irritation and congestion in your nose or some drainage.  This is from the oxygen used during your procedure.  There is no need for concern and it should clear up in a day or so.  SYMPTOMS TO REPORT IMMEDIATELY:   Following lower endoscopy (colonoscopy or flexible sigmoidoscopy):  Excessive amounts of blood in the stool  Significant tenderness or worsening of abdominal pains  Swelling of the abdomen that is new, acute  Fever of 100F or higher   Following upper endoscopy (EGD)  Vomiting of blood or coffee ground material  New chest pain or pain under the shoulder blades  Painful or persistently difficult swallowing  New shortness of breath  Fever of 100F or higher  Black, tarry-looking stools  For urgent or emergent issues, a gastroenterologist can be reached at any hour by calling [phone].   DIET:  We do recommend a small meal at  first, but then you may proceed to your regular diet.  Drink plenty of fluids but you should avoid alcoholic beverages for 24 hours.  ACTIVITY:  You should plan to take it easy for the rest of today and you should NOT DRIVE or use heavy machinery until tomorrow (because of the sedation medicines used during the test).    FOLLOW UP: Our staff will call the number listed on your records 48-72 hours following your procedure to check on you and address any questions or concerns that you may have regarding the information given to you following your procedure. If we do not reach you, we will leave a message.  We will attempt to reach you two times.  During this call, we will ask if you have developed any symptoms of COVID 19. If you develop any symptoms (ie: fever, flu-like symptoms, shortness of breath, cough etc.) before then, please call [phone].  If you test positive for Covid 19 in the 2 weeks post procedure, please call and report this information to [place].    If any biopsies were taken you will be contacted by phone or by letter within the next 1-3 weeks.  Please call us at [phone] if you have not heard about the biopsies in 3 weeks.    SIGNATURES/CONFIDENTIALITY: You and/or your care partner have signed paperwork which will be entered into your electronic medical record.  These signatures attest to the fact that that the information above on your  After Visit Summary has been reviewed and is understood.  Full responsibility of the confidentiality of this discharge information lies with you and/or your care-partner.

## 2019-04-28 NOTE — Progress Notes (Signed)
PT taken to PACU. Monitors in place. VSS. Report given to RN. 

## 2019-04-28 NOTE — Telephone Encounter (Signed)
All orders placed in Epic.

## 2019-04-28 NOTE — Telephone Encounter (Signed)
-----   Message from Thornton Park, MD sent at 04/28/2019  2:06 PM EDT ----- Patient needs labs today after endoscopy: CMP, CBC, PT/INR, AFP, and NASH Fibrosure. Please also enter order for elastography. Thank you.   Patient needs office visit with me or PA Lemmon after the elastography.  Thanks.

## 2019-04-28 NOTE — Op Note (Signed)
[facility] Patient Name: Kara Torres Procedure Date: 04/28/2019 1:58 PM MRN: [id number] Endoscopist: Thornton Park MD, MD Age: 48 Referring MD:  Date of Birth: 05/30/1971 Gender: Female Account #: [id number] Procedure:                Upper GI endoscopy Indications:              Nausea with vomiting, Weight loss (50 pounds per                            the patient), Generalized abdominal pain Medicines:                See the Anesthesia note for documentation of the                            administered medications Procedure:                Pre-Anesthesia Assessment:                           - Prior to the procedure, a History and Physical                            was performed, and patient medications and                            allergies were reviewed. The patient's tolerance of                            previous anesthesia was also reviewed. The risks                            and benefits of the procedure and the sedation                            options and risks were discussed with the patient.                            All questions were answered, and informed consent                            was obtained. Prior Anticoagulants: The patient has                            taken no previous anticoagulant or antiplatelet                            agents. ASA Grade Assessment: II - A patient with                            mild systemic disease. After reviewing the risks                            and benefits, the patient was deemed in  satisfactory condition to undergo the procedure.                           After obtaining informed consent, the endoscope was                            passed under direct vision. Throughout the                            procedure, the patient's blood pressure, pulse, and                            oxygen saturations were monitored continuously. The                            Endoscope  was introduced through the mouth, and                            advanced to the third part of duodenum. The upper                            GI endoscopy was accomplished without difficulty.                            The patient tolerated the procedure well. Scope In: Scope Out: Findings:                 Grade II varices were found in the lower third of                            the esophagus. They were medium in size.                           Moderate portal hypertensive gastropathy was found                            in the gastric body. Biopsies were taken with a                            cold forceps for histology. Estimated blood loss                            was minimal. No gastric varices present.                           The examined duodenum was normal. Biopsies were                            taken with a cold forceps for histology.                           The cardia and gastric fundus were normal on  retroflexion. A small hiatal hernia is present.                           The exam was otherwise without abnormality. Complications:            No immediate complications. Estimated blood loss:                            Minimal. Estimated Blood Loss:     Estimated blood loss was minimal. Impression:               - Grade II esophageal varices.                           - Portal hypertensive gastropathy. Biopsied.                           - No gastric varices.                           - Small hiatal hernia.                           - Normal examined duodenum. Biopsied.                           - The examination was otherwise normal. Recommendation:           - Patient has a contact number available for                            emergencies. The signs and symptoms of potential                            delayed complications were discussed with the                            patient. Return to normal activities tomorrow.                             Written discharge instructions were provided to the                            patient.                           - Resume regular diet.                           - Continue present medications.                           - Await pathology results.                           - Labs recommended today given the evidence for  portal hypertension/liver disease noted on this                            examination.                           - Elastography recommended to evaluate for liver                            damage.                           - Plan office follow-up with me or PA Lemmon after                            elastography. Thornton Park MD, MD 04/28/2019 2:36:24 PM This report has been signed electronically.

## 2019-04-28 NOTE — Progress Notes (Signed)
Pt's states no medical or surgical changes since previsit or office visit.  JB - temp CW - vitals. 

## 2019-04-28 NOTE — Progress Notes (Signed)
Called to room to assist during endoscopic procedure.  Patient ID and intended procedure confirmed with present staff. Received instructions for my participation in the procedure from the performing physician.  

## 2019-04-28 NOTE — Op Note (Signed)
[facility] Patient Name: Kara Torres Procedure Date: 04/28/2019 1:58 PM MRN: [id number] Endoscopist: Thornton Park MD, MD Age: 48 Referring MD:  Date of Birth: [birth year] Gender: Female Account #: [id number] Procedure:                Colonoscopy Indications:              Generalized abdominal pain, Weight loss Medicines:                See the Anesthesia note for documentation of the                            administered medications Procedure:                Pre-Anesthesia Assessment:                           - Prior to the procedure, a History and Physical                            was performed, and patient medications and                            allergies were reviewed. The patient's tolerance of                            previous anesthesia was also reviewed. The risks                            and benefits of the procedure and the sedation                            options and risks were discussed with the patient.                            All questions were answered, and informed consent                            was obtained. Prior Anticoagulants: The patient has                            taken no previous anticoagulant or antiplatelet                            agents. ASA Grade Assessment: II - A patient with                            mild systemic disease. After reviewing the risks                            and benefits, the patient was deemed in                            satisfactory condition to undergo the procedure.  After obtaining informed consent, the colonoscope                            was passed under direct vision. Throughout the                            procedure, the patient's blood pressure, pulse, and                            oxygen saturations were monitored continuously. The                            LOANER 0255 was introduced through the anus and                            advanced to the  the terminal ileum, with                            identification of the appendiceal orifice and IC                            valve. The colonoscopy was performed without                            difficulty. The patient tolerated the procedure                            well. The quality of the bowel preparation was                            good. The terminal ileum, ileocecal valve,                            appendiceal orifice, and rectum were photographed. Scope In: 2:10:06 PM Scope Out: 2:25:11 PM Scope Withdrawal Time: 0 hours 12 minutes 58 seconds  Total Procedure Duration: 0 hours 15 minutes 5 seconds  Findings:                 Hemorrhoids were found on perianal exam.                           Non-bleeding external and internal hemorrhoids were                            found.                           A 1 mm polyp was found in the descending colon. The                            polyp was sessile. The polyp was removed with a                            piecemeal technique using a cold snare. Resection  and retrieval were complete. Estimated blood loss                            was minimal.                           The terminal ileum appeared normal.                           The exam was otherwise without abnormality on                            direct and retroflexion views. Complications:            No immediate complications. Estimated blood loss:                            Minimal. Estimated Blood Loss:     Estimated blood loss was minimal. Impression:               - Hemorrhoids found on perianal exam.                           - Non-bleeding external and internal hemorrhoids.                           - One 1 mm polyp in the descending colon, removed                            piecemeal using a cold snare. Resected and                            retrieved.                           - The examined portion of the ileum was normal.                            - The examination was otherwise normal on direct                            and retroflexion views.                           - No obvious explanation for recent weight loss,                            abdominal pain, or nausea/vomiting identifed on the                            endoscopic exams today. Recommendation:           - Written discharge instructions were provided to                            the patient.                           -  Patient has a contact number available for                            emergencies. The signs and symptoms of potential                            delayed complications were discussed with the                            patient. Return to normal activities tomorrow.                            Written discharge instructions were provided to the                            patient.                           - Resume previous diet today.                           - Continue present medications.                           - Await pathology results.                           - Repeat colonoscopy date to be determined after                            pending pathology results are reviewed for                            surveillance based on pathology results.                           - Return to the office to review these results with                            myself or PA Lemmon. Thornton Park MD, MD 04/28/2019 2:40:14 PM This report has been signed electronically.

## 2019-04-29 ENCOUNTER — Telehealth: Payer: Self-pay | Admitting: *Deleted

## 2019-04-29 LAB — AFP TUMOR MARKER: AFP-Tumor Marker: 3.5 ng/mL

## 2019-04-29 NOTE — Telephone Encounter (Signed)
Noted! Thank you

## 2019-04-29 NOTE — Telephone Encounter (Signed)
Spoke with the patient to schedule the elastography. The patient was highly upset and frustrated that she was called for more testing stating she made it clear she had no insurance and did not want to accrue any more bills. The patient stated "I don't even know how I'm going to pay for what I've already had done." After reviewing Alen Blew PA documentation from the [facility] the patient did state this to her. She did not want to be scheduled for the elastography. She made it clear she did not want to accrue further debt with [facility]. The patient stated if her financial situation changed, she would contact the office to schedule additional testing. The order for the elastography was discontinued.

## 2019-05-01 ENCOUNTER — Telehealth: Payer: Self-pay | Admitting: Gastroenterology

## 2019-05-01 ENCOUNTER — Encounter: Payer: Self-pay | Admitting: *Deleted

## 2019-05-01 ENCOUNTER — Telehealth: Payer: Self-pay | Admitting: *Deleted

## 2019-05-01 ENCOUNTER — Other Ambulatory Visit: Payer: Self-pay | Admitting: *Deleted

## 2019-05-01 DIAGNOSIS — R109 Unspecified abdominal pain: Secondary | ICD-10-CM

## 2019-05-01 NOTE — Telephone Encounter (Signed)
1. Have you developed a fever since your procedure? no  2.   Have you had an respiratory symptoms (SOB or cough) since your procedure? no  3.   Have you tested positive for COVID 19 since your procedure no  4.   Have you had any family members/close contacts diagnosed with the COVID 19 since your procedure?  no   If yes to any of these questions please route to Joylene John, RN and Alphonsa Gin, Therapist, sports.  Follow up Call-  Call back number 04/28/2019  Post procedure Call Back phone  # [phone]  Permission to leave phone message Yes  Some recent data might be hidden     Patient questions:  Do you have a fever, pain , or abdominal swelling? No. Pain Score  0 *  Have you tolerated food without any problems? Yes.    Have you been able to return to your normal activities? Yes.    Do you have any questions about your discharge instructions: Diet   No. Medications  No. Follow up visit  No.  Do you have questions or concerns about your Care? No.  Actions: * If pain score is 4 or above: No action needed, pain <4.

## 2019-05-01 NOTE — Telephone Encounter (Signed)
FYI- Spoke to the patient, notified patient of the results. Notified the patient that she would receive a letter with the information/application for financial assistance included, along with Radiology Scheduling to be able to call herself and schedule her abd [place] complete and elastography. Orders have been placed in Epic. Patient aware of the need to call and have these scheduled when she in a position financially were she feels comfortable to. Patient aware of the need to come in for labs in one month. Orders have been placed in Epic. Patient aware she can walk in and does not need an appointment. Patient aware to call and schedule f/u with Dr. Tarri Glenn or PA Fabio Asa after she has scheduled her radiology appointments to discuss results and a plan of care.

## 2019-05-01 NOTE — Telephone Encounter (Signed)
Thank you :)

## 2019-05-07 ENCOUNTER — Encounter: Payer: Self-pay | Admitting: Gastroenterology

## 2019-05-18 ENCOUNTER — Other Ambulatory Visit: Payer: Self-pay

## 2019-05-18 DIAGNOSIS — E785 Hyperlipidemia, unspecified: Secondary | ICD-10-CM

## 2019-05-18 MED ORDER — ATORVASTATIN CALCIUM 40 MG PO TABS: 40.0000 mg | ORAL_TABLET | Freq: Every day | ORAL | 0 refills | Status: DC

## 2019-05-26 ENCOUNTER — Telehealth: Payer: Self-pay | Admitting: Family Medicine

## 2019-05-26 NOTE — Telephone Encounter (Signed)
schedule

## 2019-05-27 ENCOUNTER — Telehealth: Payer: Self-pay | Admitting: Gastroenterology

## 2019-05-27 NOTE — Telephone Encounter (Signed)
Spoke to the patient who desired to be scheduled with Ellouise Newer (11/16 at 2:30 pm). The patient stated she may have to reschedule depending on what her work schedule looks like that she will get tomorrow.   Told the patient Dr. Tarri Glenn recommendations to avoid heavy weightlifting on the scale of bodybuilding. The patient asked about lifting an air conditioner. I told the patient that lifting an air conditioner would not be considered regular, every day lifting. She laughed at this but verbalized understanding. No other concerns or questions voiced by the conclusion of the call.

## 2019-05-27 NOTE — Telephone Encounter (Signed)
Spoke to the patient who vomited blood approximately an hour after eating dinner and lifting a 24 pack of water "several times." She was told by a friend who has cirrhosis and esophageal varices to ask if she had them. This RN told the patient per the procedure report, Grade II esophageal varices, medium in size were apart of the findings. The patient is concerned about this being that her friend told her she can no longer lift heavy objects and that she could possibly "bleed to death" due to the varices. The patient is asking about treatment for the varices. Please advise.

## 2019-05-27 NOTE — Telephone Encounter (Signed)
I would recommend avoiding heavy weightlifting on the scale of bodybuilding, but most lifting of every, regular day activity is okay. I think this would be worth a follow-up office visit with me or Kara Torres given the finding of the varices on her endoscopy. Please schedule at the patient's convenience. Thank you.

## 2019-05-28 ENCOUNTER — Other Ambulatory Visit (INDEPENDENT_AMBULATORY_CARE_PROVIDER_SITE_OTHER): Payer: No Typology Code available for payment source

## 2019-05-28 ENCOUNTER — Other Ambulatory Visit: Payer: Self-pay

## 2019-05-28 DIAGNOSIS — E785 Hyperlipidemia, unspecified: Secondary | ICD-10-CM

## 2019-05-28 DIAGNOSIS — R109 Unspecified abdominal pain: Secondary | ICD-10-CM

## 2019-05-28 DIAGNOSIS — J302 Other seasonal allergic rhinitis: Secondary | ICD-10-CM

## 2019-05-28 LAB — FERRITIN: Ferritin: 20.9 ng/mL (ref 10.0–291.0)

## 2019-05-28 LAB — POTASSIUM: Potassium: 3.8 mEq/L (ref 3.5–5.1)

## 2019-05-28 LAB — IRON: Iron: 28 ug/dL — ABNORMAL LOW (ref 42–145)

## 2019-05-28 MED ORDER — CETIRIZINE HCL 10 MG PO TABS: 10.0000 mg | ORAL_TABLET | Freq: Every day | ORAL | 11 refills | Status: DC

## 2019-05-28 MED ORDER — OMEPRAZOLE 40 MG PO CPDR: 40.0000 mg | DELAYED_RELEASE_CAPSULE | Freq: Two times a day (BID) | ORAL | 2 refills | Status: DC

## 2019-05-28 MED ORDER — FLUOXETINE HCL 20 MG PO TABS: 20.0000 mg | ORAL_TABLET | Freq: Every day | ORAL | 2 refills | Status: DC

## 2019-05-28 MED ORDER — ATORVASTATIN CALCIUM 40 MG PO TABS: 40.0000 mg | ORAL_TABLET | Freq: Every day | ORAL | 0 refills | Status: DC

## 2019-05-28 NOTE — Telephone Encounter (Signed)
FYI:  Correction: Prozac, Prilosec & Lipitor ordered today. All have been corrected and vebally cancelled.  Per patient she only needed Zyrtec. Patient has a follow up with Lanelle Bal NP.

## 2019-05-29 ENCOUNTER — Telehealth: Payer: Self-pay | Admitting: Internal Medicine

## 2019-05-31 LAB — TRANSFERRIN SATURATION
IRON SATN MFR SERPL: 8 % Saturation — ABNORMAL LOW
IRON SERPL-MCNC: 31 ug/dL — ABNORMAL LOW
TRANSFERRIN SERPL-MCNC: 263 mg/dL

## 2019-06-01 NOTE — Telephone Encounter (Signed)
done

## 2019-06-05 ENCOUNTER — Ambulatory Visit: Payer: No Typology Code available for payment source | Admitting: Physician Assistant

## 2019-06-17 ENCOUNTER — Other Ambulatory Visit: Payer: Self-pay

## 2019-06-17 ENCOUNTER — Encounter: Payer: Self-pay | Admitting: Family Medicine

## 2019-06-17 ENCOUNTER — Ambulatory Visit (INDEPENDENT_AMBULATORY_CARE_PROVIDER_SITE_OTHER): Payer: Self-pay | Admitting: Family Medicine

## 2019-06-17 VITALS — BP 111/76 | HR 83 | Temp 98.0°F | Resp 16 | Ht 64.0 in | Wt 175.0 lb

## 2019-06-17 DIAGNOSIS — H539 Unspecified visual disturbance: Secondary | ICD-10-CM

## 2019-06-17 DIAGNOSIS — Z09 Encounter for follow-up examination after completed treatment for conditions other than malignant neoplasm: Secondary | ICD-10-CM

## 2019-06-17 DIAGNOSIS — H269 Unspecified cataract: Secondary | ICD-10-CM

## 2019-06-17 DIAGNOSIS — K2950 Unspecified chronic gastritis without bleeding: Secondary | ICD-10-CM

## 2019-06-17 DIAGNOSIS — F1010 Alcohol abuse, uncomplicated: Secondary | ICD-10-CM

## 2019-06-17 DIAGNOSIS — I8510 Secondary esophageal varices without bleeding: Secondary | ICD-10-CM

## 2019-06-17 DIAGNOSIS — K7030 Alcoholic cirrhosis of liver without ascites: Secondary | ICD-10-CM

## 2019-06-17 DIAGNOSIS — I10 Essential (primary) hypertension: Secondary | ICD-10-CM

## 2019-06-17 LAB — POCT URINALYSIS DIPSTICK
Bilirubin, UA: NEGATIVE
Blood, UA: NEGATIVE
Glucose, UA: NEGATIVE
Ketones, UA: NEGATIVE
Leukocytes, UA: NEGATIVE
Nitrite, UA: NEGATIVE
Protein, UA: NEGATIVE
Spec Grav, UA: 1.01 (ref 1.010–1.025)
Urobilinogen, UA: 0.2 E.U./dL
pH, UA: 7 (ref 5.0–8.0)

## 2019-06-17 MED ORDER — PANTOPRAZOLE SODIUM 20 MG PO TBEC: 20.0000 mg | DELAYED_RELEASE_TABLET | Freq: Every day | ORAL | 6 refills | Status: DC

## 2019-06-17 NOTE — Progress Notes (Signed)
[name]    Established Patient Office Visit  Subjective:  Patient ID: Kara Torres, female    DOB: Aug 01, 1970  Age: 48 y.o. MRN: [id number]  CC:  Chief Complaint  Patient presents with  . Hypertension  . Medication Refill    HPI Kara Torres is a 48 year old female who presents for Follow Up today.   Past Medical History:  Diagnosis Date  . Allergy   . Alpha-1-antitrypsin deficiency ([place])   . Anxiety   . Asthma   . Cataracts, bilateral   . Depression   . History of cholecystectomy   . Liver disease    Current Status: Since her last office visit, she has recently been diagnosed with Cataracts and has been given Rx for bifocals. She has recently been diagnosed with Cirrhosis of Liver and Gastritis. She was previously seen by Physician Assistant, Marco Collie at [place]. She continues to drink 3 alcoholic beverages daily. Denies GI problems such as nausea, vomiting, diarrhea, and constipation. She has no reports of blood in stools, dysuria and hematuria.Her anxiety is mild today. She denies suicidal ideations, homicidal ideations, or auditory hallucinations. She denies fevers, chills, fatigue, recent infections, weight loss, and night sweats. She has not had any headaches, visual changes, dizziness, and falls. No chest pain, heart palpitations, cough and shortness of breath reported.  She denies pain today.   Past Surgical History:  Procedure Laterality Date  . CHOLECYSTECTOMY  2000    Family History  Problem Relation Age of Onset  . Stroke Mother   . Hyperlipidemia Mother   . Diabetes Mother   . Diabetes Father   . Hypertension Father   . Hyperlipidemia Father   . Mental illness Father   . Stroke Father   . Diabetes Maternal Grandmother   . Heart disease Maternal Grandmother   . Hypertension Maternal Grandmother   . Heart attack Maternal Grandmother   . Esophageal cancer Neg Hx   . Stomach cancer  Neg Hx   . Rectal cancer Neg Hx     Social History   Socioeconomic History  . Marital status: Divorced    Spouse name: Not on file  . Number of children: Not on file  . Years of education: Not on file  . Highest education level: Not on file  Occupational History  . Occupation: Therapist, art  Social Needs  . Financial resource strain: Not on file  . Food insecurity    Worry: Not on file    Inability: Not on file  . Transportation needs    Medical: Not on file    Non-medical: Not on file  Tobacco Use  . Smoking status: Former Research scientist (life sciences)  . Smokeless tobacco: Never Used  Substance and Sexual Activity  . Alcohol use: Yes    Alcohol/week: 2.0 standard drinks    Types: 2 Cans of beer per week    Comment: occ  . Drug use: No  . Sexual activity: Not on file  Lifestyle  . Physical activity    Days per week: Not on file    Minutes per session: Not on file  . Stress: Not on file  Relationships  . Social Herbalist on phone: Not on file    Gets together: Not on file    Attends religious service: Not on file    Active member of club or organization: Not on file    Attends meetings of clubs or  organizations: Not on file    Relationship status: Not on file  . Intimate partner violence    Fear of current or ex partner: Not on file    Emotionally abused: Not on file    Physically abused: Not on file    Forced sexual activity: Not on file  Other Topics Concern  . Not on file  Social History Narrative  . Not on file    Outpatient Medications Prior to Visit  Medication Sig Dispense Refill  . atorvastatin (LIPITOR) 40 MG tablet Take 1 tablet (40 mg total) by mouth daily. 30 tablet 0  . cetirizine (ZYRTEC) 10 MG tablet Take 1 tablet (10 mg total) by mouth daily. 30 tablet 11  . cloNIDine (CATAPRES) 0.1 MG tablet Take 2 tablets (0.2 mg total) by mouth at bedtime. Unsure of strength 90 tablet 0  . FLUoxetine (PROZAC) 20 MG tablet Take 1 tablet (20 mg total) by mouth  daily. 30 tablet 2  . fluticasone (FLONASE) 50 MCG/ACT nasal spray Place 2 sprays into both nostrils daily. 1 g 3  . loperamide (IMODIUM A-D) 2 MG tablet Take 1 tablet (2 mg total) by mouth 4 (four) times daily as needed for diarrhea or loose stools. 30 tablet 1  . omeprazole (PRILOSEC) 40 MG capsule Take 1 capsule (40 mg total) by mouth 2 (two) times daily. 90 capsule 2  . ondansetron (ZOFRAN) 4 MG tablet Take 1 tablet (4 mg total) by mouth every 8 (eight) hours as needed for nausea or vomiting. 20 tablet 2  . ipratropium (ATROVENT) 0.03 % nasal spray Place 2 sprays into both nostrils 2 (two) times daily. (Patient not taking: Reported on 06/17/2019) 30 mL 3  . naproxen (NAPROSYN) 500 MG tablet Take 1 tablet (500 mg total) by mouth 2 (two) times daily with a meal. (Patient not taking: Reported on 06/17/2019) 180 tablet 6   No facility-administered medications prior to visit.     Allergies  Allergen Reactions  . Epinephrine   . Hydrocodone Rash    ROS Review of Systems  Constitutional: Negative.   HENT: Negative.   Eyes: Positive for visual disturbance.  Respiratory: Negative.   Cardiovascular: Negative.   Gastrointestinal: Positive for diarrhea (occasional ), nausea (occasional ) and vomiting (occasional).  Endocrine: Negative.   Genitourinary: Negative.   Musculoskeletal: Negative.   Skin: Negative.   Allergic/Immunologic: Negative.   Neurological: Negative.   Hematological: Negative.   Psychiatric/Behavioral: Negative.    Objective:    Physical Exam  Constitutional: She is oriented to person, place, and time. She appears well-developed and well-nourished.  HENT:  Head: Normocephalic and atraumatic.  Eyes: Conjunctivae are normal.  Neck: Normal range of motion. Neck supple.  Cardiovascular: Normal rate, regular rhythm, normal heart sounds and intact distal pulses.  Pulmonary/Chest: Effort normal and breath sounds normal.  Abdominal: Soft. Bowel sounds are normal.   Musculoskeletal: Normal range of motion.  Neurological: She is alert and oriented to person, place, and time. She has normal reflexes.  Skin: Skin is warm and dry.  Psychiatric: She has a normal mood and affect. Her behavior is normal. Judgment and thought content normal.  Nursing note and vitals reviewed.  BP 111/76 (BP Location: Left Arm, Patient Position: Sitting, Cuff Size: Large)   Pulse 83   Temp 98 F (36.7 C) (Oral)   Resp 16   Ht 5\' 4"  (1.626 m)   Wt 175 lb (79.4 kg)   LMP 05/07/2019   SpO2 98%   BMI 30.04  kg/m  Wt Readings from Last 3 Encounters:  06/17/19 175 lb (79.4 kg)  04/28/19 174 lb (78.9 kg)  04/06/19 174 lb (78.9 kg)     Health Maintenance Due  Topic Date Due  . HIV Screening  05/12/1988  . PAP SMEAR-Modifier  03/17/2019    There are no preventive care reminders to display for this patient.  Lab Results  Component Value Date   TSH 3.94 04/19/2017   Lab Results  Component Value Date   WBC 5.4 04/28/2019   HGB 9.3 (L) 04/28/2019   HCT 30.7 (L) 04/28/2019   MCV 77.1 (L) 04/28/2019   PLT 171.0 04/28/2019   Lab Results  Component Value Date   NA 137 04/28/2019   K 3.8 05/28/2019   CO2 27 04/28/2019   GLUCOSE 102 (H) 04/28/2019   BUN 4 (L) 04/28/2019   CREATININE 0.60 04/28/2019   BILITOT 1.3 (H) 04/28/2019   ALKPHOS 169 (H) 04/28/2019   AST 88 (H) 04/28/2019   ALT 47 (H) 04/28/2019   PROT 7.3 04/28/2019   ALBUMIN 3.3 (L) 04/28/2019   CALCIUM 8.6 04/28/2019   GFR 107.65 04/28/2019   Lab Results  Component Value Date   CHOL 186 11/02/2016   Lab Results  Component Value Date   HDL 49 (L) 11/02/2016   Lab Results  Component Value Date   LDLCALC 111 (H) 11/02/2016   Lab Results  Component Value Date   TRIG 128 11/02/2016   Lab Results  Component Value Date   CHOLHDL 3.8 11/02/2016   Lab Results  Component Value Date   HGBA1C 5.0 04/18/2018      Assessment & Plan:   1. Essential hypertension The current medical regimen  is effective; blood pressure is stable at 111/76 today; continue present plan and medications as prescribed. She will continue to take medications as prescribed, to decrease high sodium intake, excessive alcohol intake, increase potassium intake, smoking cessation, and increase physical activity of at least 30 minutes of cardio activity daily. She will continue to follow Heart Healthy or DASH diet. - Urinalysis Dipstick - TSH - Lipid Panel - Vitamin B12 - Vitamin D, 25-hydroxy  2. Visual disturbance She will continue to follow up with Optometry as needed.   3. Cataract of both eyes, unspecified cataract type She has been diagnosed with beginning stages of Cataracts.   4. Chronic gastritis without bleeding, unspecified gastritis type We will initiate Pantoprazole today.  - pantoprazole (PROTONIX) 20 MG tablet; Take 1 tablet (20 mg total) by mouth daily.  Dispense: 30 tablet; Refill: 6  5. Esophageal varices in alcoholic cirrhosis (HCC) Stable. No signs or symptoms of bleeding noted lately. She continues to follow up with GI as needed.   6. Alcohol abuse  7. Follow up She will follow up in 3 months.   Meds ordered this encounter  Medications  . pantoprazole (PROTONIX) 20 MG tablet    Sig: Take 1 tablet (20 mg total) by mouth daily.    Dispense:  30 tablet    Refill:  6    Orders Placed This Encounter  Procedures  . TSH  . Lipid Panel  . Vitamin B12  . Vitamin D, 25-hydroxy  . Urinalysis Dipstick    Referral Orders  No referral(s) requested today    Kathe Becton,  MSN, FNP-BC [facility] [address] [place], [place] [address] [phone] fax  Problem List Items Addressed This Visit  Cardiovascular and Mediastinum   Essential hypertension - Primary   Relevant Orders   Urinalysis Dipstick     Other   Visual disturbance    Other Visit Diagnoses    Cataract of both  eyes, unspecified cataract type       Follow up          No orders of the defined types were placed in this encounter.   Follow-up: No follow-ups on file.    Azzie Glatter, FNP

## 2019-06-18 LAB — LIPID PANEL
Chol/HDL Ratio: 3.7 ratio (ref 0.0–4.4)
Cholesterol, Total: 126 mg/dL (ref 100–199)
HDL: 34 mg/dL — ABNORMAL LOW (ref >39)
LDL Chol Calc (NIH): 77 mg/dL (ref 0–99)
Triglycerides: 76 mg/dL (ref 0–149)
VLDL Cholesterol Cal: 15 mg/dL (ref 5–40)

## 2019-06-18 LAB — TSH: TSH: 2.13 u[IU]/mL (ref 0.450–4.500)

## 2019-06-18 LAB — VITAMIN B12: Vitamin B-12: 1105 pg/mL (ref 232–1245)

## 2019-06-18 LAB — VITAMIN D 25 HYDROXY (VIT D DEFICIENCY, FRACTURES): Vit D, 25-Hydroxy: 12.3 ng/mL — ABNORMAL LOW (ref 30.0–100.0)

## 2019-06-19 ENCOUNTER — Encounter: Payer: Self-pay | Admitting: Family Medicine

## 2019-06-19 DIAGNOSIS — H269 Unspecified cataract: Secondary | ICD-10-CM

## 2019-06-19 DIAGNOSIS — K2950 Unspecified chronic gastritis without bleeding: Secondary | ICD-10-CM

## 2019-06-19 DIAGNOSIS — I8510 Secondary esophageal varices without bleeding: Secondary | ICD-10-CM | POA: Insufficient documentation

## 2019-06-19 DIAGNOSIS — F1010 Alcohol abuse, uncomplicated: Secondary | ICD-10-CM | POA: Insufficient documentation

## 2019-06-19 DIAGNOSIS — K7030 Alcoholic cirrhosis of liver without ascites: Secondary | ICD-10-CM | POA: Insufficient documentation

## 2019-06-19 HISTORY — DX: Alcoholic cirrhosis of liver without ascites: K70.30

## 2019-06-19 HISTORY — DX: Alcohol abuse, uncomplicated: F10.10

## 2019-06-19 HISTORY — DX: Secondary esophageal varices without bleeding: I85.10

## 2019-06-19 HISTORY — DX: Unspecified chronic gastritis without bleeding: K29.50

## 2019-06-19 HISTORY — DX: Unspecified cataract: H26.9

## 2019-06-23 ENCOUNTER — Other Ambulatory Visit: Payer: Self-pay | Admitting: Family Medicine

## 2019-06-23 DIAGNOSIS — E785 Hyperlipidemia, unspecified: Secondary | ICD-10-CM

## 2019-06-23 DIAGNOSIS — E559 Vitamin D deficiency, unspecified: Secondary | ICD-10-CM

## 2019-06-23 HISTORY — DX: Vitamin D deficiency, unspecified: E55.9

## 2019-06-25 ENCOUNTER — Other Ambulatory Visit: Payer: Self-pay | Admitting: Family Medicine

## 2019-06-25 ENCOUNTER — Telehealth: Payer: Self-pay

## 2019-06-25 ENCOUNTER — Encounter: Payer: Self-pay | Admitting: Family Medicine

## 2019-06-25 DIAGNOSIS — E559 Vitamin D deficiency, unspecified: Secondary | ICD-10-CM

## 2019-06-25 MED ORDER — VITAMIN D (ERGOCALCIFEROL) 1.25 MG (50000 UNIT) PO CAPS: 50000.0000 [IU] | ORAL_CAPSULE | ORAL | 6 refills | Status: DC

## 2019-06-25 NOTE — Telephone Encounter (Signed)
Called, no answer. No voicemail picked up.

## 2019-06-25 NOTE — Telephone Encounter (Signed)
-----   Message from Azzie Glatter, FNP sent at 06/25/2019  7:11 AM EST ----- Vitamin D level is decreased. Rx for Vitamin D sent to pharmacy today. She should include foods that are high in Vitamin D. These include: Salmon, Cod Liver Oil, Mushrooms, Canned Fish, Milk, and Egg Yolks.  All other results are stable. Keep follow up appointment. Please inform patient. Thank you.

## 2019-06-26 ENCOUNTER — Other Ambulatory Visit: Payer: Self-pay

## 2019-06-26 DIAGNOSIS — J301 Allergic rhinitis due to pollen: Secondary | ICD-10-CM

## 2019-06-26 DIAGNOSIS — J302 Other seasonal allergic rhinitis: Secondary | ICD-10-CM

## 2019-06-26 DIAGNOSIS — I10 Essential (primary) hypertension: Secondary | ICD-10-CM

## 2019-06-26 DIAGNOSIS — E785 Hyperlipidemia, unspecified: Secondary | ICD-10-CM

## 2019-06-26 MED ORDER — OMEPRAZOLE 40 MG PO CPDR: 40.0000 mg | DELAYED_RELEASE_CAPSULE | Freq: Two times a day (BID) | ORAL | 2 refills | Status: DC

## 2019-06-26 MED ORDER — CLONIDINE HCL 0.1 MG PO TABS: 0.2000 mg | ORAL_TABLET | Freq: Every day | ORAL | 3 refills | Status: DC

## 2019-06-26 MED ORDER — CETIRIZINE HCL 10 MG PO TABS: 10.0000 mg | ORAL_TABLET | Freq: Every day | ORAL | 11 refills | Status: AC

## 2019-06-26 MED ORDER — ATORVASTATIN CALCIUM 40 MG PO TABS: 40.0000 mg | ORAL_TABLET | Freq: Every day | ORAL | 3 refills | Status: DC

## 2019-06-26 MED ORDER — FLUOXETINE HCL 20 MG PO TABS: 20.0000 mg | ORAL_TABLET | Freq: Every day | ORAL | 2 refills | Status: DC

## 2019-06-26 MED ORDER — FLUTICASONE PROPIONATE 50 MCG/ACT NA SUSP: 2.0000 | Freq: Every day | NASAL | 3 refills | Status: DC

## 2019-06-26 NOTE — Telephone Encounter (Signed)
Called and spoke to patient advised of Vitamin D. Advised that rx has been sent in and gave recommendations as directed.

## 2019-08-12 ENCOUNTER — Encounter: Payer: Self-pay | Admitting: Physician Assistant

## 2019-08-12 ENCOUNTER — Ambulatory Visit (INDEPENDENT_AMBULATORY_CARE_PROVIDER_SITE_OTHER): Payer: Medicaid Other | Admitting: Physician Assistant

## 2019-08-12 VITALS — BP 110/80 | HR 110 | Temp 97.8°F | Ht 64.0 in | Wt 168.0 lb

## 2019-08-12 DIAGNOSIS — I8510 Secondary esophageal varices without bleeding: Secondary | ICD-10-CM

## 2019-08-12 DIAGNOSIS — D509 Iron deficiency anemia, unspecified: Secondary | ICD-10-CM | POA: Diagnosis not present

## 2019-08-12 DIAGNOSIS — F1011 Alcohol abuse, in remission: Secondary | ICD-10-CM

## 2019-08-12 DIAGNOSIS — R112 Nausea with vomiting, unspecified: Secondary | ICD-10-CM

## 2019-08-12 DIAGNOSIS — R1013 Epigastric pain: Secondary | ICD-10-CM

## 2019-08-12 DIAGNOSIS — K7031 Alcoholic cirrhosis of liver with ascites: Secondary | ICD-10-CM

## 2019-08-12 DIAGNOSIS — K7030 Alcoholic cirrhosis of liver without ascites: Secondary | ICD-10-CM

## 2019-08-12 MED ORDER — SPIRONOLACTONE 50 MG PO TABS: 50.0000 mg | ORAL_TABLET | Freq: Every day | ORAL | 5 refills | Status: DC

## 2019-08-12 MED ORDER — FUROSEMIDE 40 MG PO TABS: 40.0000 mg | ORAL_TABLET | Freq: Every day | ORAL | 5 refills | Status: DC

## 2019-08-12 MED ORDER — FERROUS SULFATE 325 (65 FE) MG PO TABS: 325.0000 mg | ORAL_TABLET | ORAL | 2 refills | Status: AC

## 2019-08-12 NOTE — Progress Notes (Signed)
Chief Complaint: Cirrhosis, abdominal pain  HPI:    Kara Torres is a 49 year old, known to Dr. Tarri Glenn, with a past medical history as listed below including esophageal varices, who presents to clinic today for a complaint of cirrhosis and abdominal pain.      04/06/2019 patient seen in clinic by me for nausea abdominal pain weight loss and bloating.  That time she is scheduled for EGD and colonoscopy and her omeprazole was increased to 40 mg twice daily.    04/28/2019 colonoscopy with hemorrhoids, nonbleeding external and internal, 1 mm polyp in the descending colon and otherwise normal.  EGD on the same day with grade 2 esophageal varices, portal hypertensive gastropathy, small hiatal hernia and otherwise normal.  Ultrasound elastography was ordered as well as labs.    04/28/2019 CMP, AFP, INR and CBC.  CMP with AST elevated 88, ALT 47, alk phos 169, total bili 1.3.  CBC with a hemoglobin of 9.3 which appears around patient's baseline over the past couple of years.  INR at 1.4.  AFP normal.    05/28/2019 patient was found to be iron deficient with an iron low at 28, ferritin low at 20.    Today, the patient presents clinic and explains that she continues with all of her previous complaints telling me that she has severe epigastric pain which is unchanged with Pantoprazole 20 mg daily.  She also has continued with nausea and almost daily vomiting with reflux.  Tells me that this makes it hard for her to sleep at night.    Also complains today that she has had an increase in abdominal distention noting that her abdomen is tight and she can see "little veins all over it".  Has also had some swelling in her legs up to the level of her shin.  Tells me this makes her feel fatigued.  Tells me her weight in general is decreasing.    Also complains of skin lesions all over her back of which she shows me a picture.  Apparently she has been treating these like acne.    Also tells me today that she has always  had an enlarged spleen and has been reading about this and portal hypertension on Web [employer] and has some questions.    Patient was unaware of her iron deficiency anemia but does tell me that she is vitamin D deficient as well and saw her PCP in regards to this recently.    Reports a family history of ITP.    Denies fever, chills or blood in her stool.  Past Medical History:  Diagnosis Date  . Allergy   . Alpha-1-antitrypsin deficiency ([place])   . Anxiety   . Asthma   . Cataracts, bilateral   . Depression   . Esophageal varices ([place])   . History of cholecystectomy   . Liver disease   . Vitamin D deficiency 06/2019    Past Surgical History:  Procedure Laterality Date  . CHOLECYSTECTOMY  2000    Current Outpatient Medications  Medication Sig Dispense Refill  . atorvastatin (LIPITOR) 40 MG tablet Take 1 tablet (40 mg total) by mouth daily. 30 tablet 3  . cetirizine (ZYRTEC) 10 MG tablet Take 1 tablet (10 mg total) by mouth daily. 30 tablet 11  . cloNIDine (CATAPRES) 0.1 MG tablet Take 2 tablets (0.2 mg total) by mouth at bedtime. Unsure of strength 90 tablet 3  . FLUoxetine (PROZAC) 20 MG tablet Take 1 tablet (20 mg total) by mouth daily.  30 tablet 2  . fluticasone (FLONASE) 50 MCG/ACT nasal spray Place 2 sprays into both nostrils daily. 1 g 3  . ipratropium (ATROVENT) 0.03 % nasal spray Place 2 sprays into both nostrils 2 (two) times daily. (Patient not taking: Reported on 06/17/2019) 30 mL 3  . loperamide (IMODIUM A-D) 2 MG tablet Take 1 tablet (2 mg total) by mouth 4 (four) times daily as needed for diarrhea or loose stools. 30 tablet 1  . omeprazole (PRILOSEC) 40 MG capsule Take 1 capsule (40 mg total) by mouth 2 (two) times daily. 90 capsule 2  . ondansetron (ZOFRAN) 4 MG tablet Take 1 tablet (4 mg total) by mouth every 8 (eight) hours as needed for nausea or vomiting. 20 tablet 2  . pantoprazole (PROTONIX) 20 MG tablet Take 1 tablet (20 mg total) by mouth daily. 30 tablet 6  .  Vitamin D, Ergocalciferol, (DRISDOL) 1.25 MG (50000 UT) CAPS capsule Take 1 capsule (50,000 Units total) by mouth every 7 (seven) days. 5 capsule 6   No current facility-administered medications for this visit.    Allergies as of 08/12/2019 - Review Complete 06/17/2019  Allergen Reaction Noted  . Epinephrine  12/25/2014  . Hydrocodone Rash 12/25/2014    Family History  Problem Relation Age of Onset  . Stroke Mother   . Hyperlipidemia Mother   . Diabetes Mother   . Diabetes Father   . Hypertension Father   . Hyperlipidemia Father   . Mental illness Father   . Stroke Father   . Diabetes Maternal Grandmother   . Heart disease Maternal Grandmother   . Hypertension Maternal Grandmother   . Heart attack Maternal Grandmother   . Esophageal cancer Neg Hx   . Stomach cancer Neg Hx   . Rectal cancer Neg Hx     Social History   Socioeconomic History  . Marital status: Divorced    Spouse name: Not on file  . Number of children: Not on file  . Years of education: Not on file  . Highest education level: Not on file  Occupational History  . Occupation: Therapist, art  Tobacco Use  . Smoking status: Former Research scientist (life sciences)  . Smokeless tobacco: Never Used  Substance and Sexual Activity  . Alcohol use: Yes    Alcohol/week: 2.0 standard drinks    Types: 2 Cans of beer per week    Comment: occ  . Drug use: No  . Sexual activity: Not on file  Other Topics Concern  . Not on file  Social History Narrative  . Not on file   Social Determinants of Health   Financial Resource Strain:   . Difficulty of Paying Living Expenses: Not on file  Food Insecurity:   . Worried About Charity fundraiser in the Last Year: Not on file  . Ran Out of Food in the Last Year: Not on file  Transportation Needs:   . Lack of Transportation (Medical): Not on file  . Lack of Transportation (Non-Medical): Not on file  Physical Activity:   . Days of Exercise per Week: Not on file  . Minutes of Exercise per  Session: Not on file  Stress:   . Feeling of Stress : Not on file  Social Connections:   . Frequency of Communication with Friends and Family: Not on file  . Frequency of Social Gatherings with Friends and Family: Not on file  . Attends Religious Services: Not on file  . Active Member of Clubs or Organizations: Not on file  .  Attends Archivist Meetings: Not on file  . Marital Status: Not on file  Intimate Partner Violence:   . Fear of Current or Ex-Partner: Not on file  . Emotionally Abused: Not on file  . Physically Abused: Not on file  . Sexually Abused: Not on file    Review of Systems:    Constitutional: No weight loss, fever or chills Cardiovascular: No chest pain Respiratory: +DOE Gastrointestinal: See HPI and otherwise negative   Physical Exam:  Vital signs: BP 110/80   Pulse (!) 110   Temp 97.8 F (36.6 C)   Ht '5\' 4"'  (1.626 m)   Wt 168 lb (76.2 kg)   BMI 28.84 kg/m   Constitutional:   Pleasant Caucasian female appears to be in NAD, Well developed, Well nourished, alert and cooperative Respiratory: Respirations even and unlabored. Lungs clear to auscultation bilaterally.   No wheezes, crackles, or rhonchi.  Cardiovascular: Normal S1, S2. No MRG. Regular rate and rhythm. +2+ b/l edema to level of the shin Gastrointestinal: tense, marked distension,mild generalized ttp. No rebound or guarding. Normal bowel sounds. No appreciable masses or hepatomegaly.+fluid wave Rectal:  Not performed.  Psychiatric: Demonstrates good judgement and reason without abnormal affect or behaviors.  RELEVANT LABS AND IMAGING: CBC    Component Value Date/Time   WBC 5.4 04/28/2019 1529   RBC 3.98 04/28/2019 1529   HGB 9.3 (L) 04/28/2019 1529   HGB 9.6 (L) 04/18/2018 1348   HCT 30.7 (L) 04/28/2019 1529   HCT 32.0 (L) 04/18/2018 1348   PLT 171.0 04/28/2019 1529   PLT 164 04/18/2018 1348   MCV 77.1 (L) 04/28/2019 1529   MCV 82 04/18/2018 1348   MCH 24.6 (L) 04/18/2018 1348     MCH 26.6 (L) 04/19/2017 1625   MCHC 30.3 04/28/2019 1529   RDW 19.2 (H) 04/28/2019 1529   RDW 17.2 (H) 04/18/2018 1348   LYMPHSABS 0.9 04/28/2019 1529   LYMPHSABS 1.1 04/18/2018 1348   MONOABS 0.4 04/28/2019 1529   EOSABS 0.1 04/28/2019 1529   EOSABS 0.1 04/18/2018 1348   BASOSABS 0.0 04/28/2019 1529   BASOSABS 0.0 04/18/2018 1348    CMP     Component Value Date/Time   NA 137 04/28/2019 1529   NA 139 04/18/2018 1348   K 3.8 05/28/2019 1455   CL 101 04/28/2019 1529   CO2 27 04/28/2019 1529   GLUCOSE 102 (H) 04/28/2019 1529   BUN 4 (L) 04/28/2019 1529   BUN 8 04/18/2018 1348   CREATININE 0.60 04/28/2019 1529   CREATININE 0.80 04/19/2017 1625   CALCIUM 8.6 04/28/2019 1529   PROT 7.3 04/28/2019 1529   PROT 7.4 04/18/2018 1348   ALBUMIN 3.3 (L) 04/28/2019 1529   ALBUMIN 4.0 04/18/2018 1348   AST 88 (H) 04/28/2019 1529   ALT 47 (H) 04/28/2019 1529   ALKPHOS 169 (H) 04/28/2019 1529   BILITOT 1.3 (H) 04/28/2019 1529   BILITOT 0.4 04/18/2018 1348   GFRNONAA 79 04/18/2018 1348   GFRNONAA 90 04/19/2017 1625   GFRAA 91 04/18/2018 1348   GFRAA 105 04/19/2017 1625    Assessment: 1.  IDA: EGD with portal hypertensive gastropathy which could be the cause, may need to evaluate small bowel in the future 2.  Cirrhosis with esophageal varices and ascites: Likely due to alcohol 3.  Nausea and vomiting: Consider relation to below 4.  Epigastric pain: Continues with nausea and vomiting as above, gastritis seen at time of EGD, likely the cause combined with ascites today 5.  History of alcohol abuse: Patient describes her last drink was 2 to 3 weeks ago, she is trying to abstain  Plan: 1.  Had a long discussion with the patient today in regards to cirrhosis and the various sequela.  Likely the lesions on her back are related to her cirrhosis.  Along with the veins that she is seeing in her stomach.  Discussed daily weight checks for the patient to ensure that her fluid level is  normalized. 2.  Also discussed the patient is due for [facility] screening with an ultrasound.  We will go ahead and order an ultrasound with elastography and paracentesis today.  Fluid to be sent for further analysis with cell count and differential, cytology, Gram stain and protein level 3.  Started patient on Spironolactone 50 mg daily with a goal to get to 100 mg daily in the future.  Prescribed #30 with 5 refills 4.  Started the patient on Lasix 40 mg daily #30 with 5 refills 5.  Patient will have recheck CBC and CMP in 1 week to ensure that electrolytes are okay after starting diuretics 6.  Patient will eventually need a small bowel follow-through for work-up of her IDA as EGD and colonoscopy did not show a source, she can discuss necessity at her next appointment with Dr. Tarri Glenn 7.  Increased Pantoprazole to 40 mg twice daily, 30 to 60 minutes before breakfast and dinner #60 with 5 refills 8.  Would recommend the patient start ferrous sulfate 325 mg p.o. every other day, prescribed #30 with 5 refills 9.  Unsure if patient's blood pressure would be able to withstand a beta-blocker, this can be reassessed with Dr. Tarri Glenn in a month 10.  Patient was set a follow-up with Dr. Tarri Glenn in 4 weeks as she has never seen her in clinic.  Ellouise Newer, PA-C [facility] 08/12/2019, 8:31 AM  Cc: Azzie Glatter, FNP

## 2019-08-12 NOTE — Progress Notes (Signed)
Reviewed and agree with management plans. ? ?Anetha Slagel L. Brylin Stanislawski, MD, MPH  ?

## 2019-08-12 NOTE — Patient Instructions (Signed)
If you are age 47 or older, your body mass index should be between 23-30. Your Body mass index is 28.84 kg/m. If this is out of the aforementioned range listed, please consider follow up with your Primary Care Provider.  If you are age 21 or younger, your body mass index should be between 19-25. Your Body mass index is 28.84 kg/m. If this is out of the aformentioned range listed, please consider follow up with your Primary Care Provider.   Continue abstaining from alcohol.   Your provider has requested that you go to the basement level for lab work in one week (08/19/19) . Press "B" on the elevator. The lab is located at the first door on the left as you exit the elevator.  We have sent the following medications to your pharmacy for you to pick up at your convenience:  Ferrous Sulfafe, Lasix, Spironolactone.  You have been scheduled for an abdominal paracentesis at [facility] radiology (1st floor of hospital) on Thursday 08/20/19 at 9 am. Please arrive at least 15 minutes prior to your appointment time for registration. Should you need to reschedule this appointment for any reason, please call our office at [phone].   You have been scheduled for an abdominal ultrasound at [facility] (1st floor radiology) on Thursday 08/20/19 at 10:30 am. Please arrive 15 minutes prior to your appointment for registration. Make certain not to have anything to eat or drink 6 hours prior to your appointment. Should you need to reschedule your appointment, please contact radiology at [phone]. This test typically takes about 30 minutes to perform.

## 2019-08-13 ENCOUNTER — Ambulatory Visit (HOSPITAL_COMMUNITY): Payer: Medicaid Other

## 2019-08-17 ENCOUNTER — Telehealth: Payer: Self-pay | Admitting: Physician Assistant

## 2019-08-17 NOTE — Telephone Encounter (Signed)
Patient called and wanted Kara Newer PA to know she thought of a reason she might be anemic. In 2010 or 2011 she was diagnosed with antitrypsin deficiency

## 2019-08-17 NOTE — Telephone Encounter (Signed)
Called patient and let her know her IR paracentesis and [place]  Should be done by about 10:30am, if nothing unexpected  Came up. She will keep her appts.

## 2019-08-20 ENCOUNTER — Other Ambulatory Visit: Payer: Self-pay

## 2019-08-20 ENCOUNTER — Ambulatory Visit (HOSPITAL_COMMUNITY)
Admission: RE | Admit: 2019-08-20 | Discharge: 2019-08-20 | Disposition: A | Discharge status: Home / Self Care | Payer: Medicaid Other | Source: Ambulatory Visit | Attending: Physician Assistant | Admitting: Physician Assistant

## 2019-08-20 DIAGNOSIS — D509 Iron deficiency anemia, unspecified: Secondary | ICD-10-CM | POA: Insufficient documentation

## 2019-08-20 DIAGNOSIS — R112 Nausea with vomiting, unspecified: Secondary | ICD-10-CM | POA: Insufficient documentation

## 2019-08-20 DIAGNOSIS — K7031 Alcoholic cirrhosis of liver with ascites: Secondary | ICD-10-CM | POA: Insufficient documentation

## 2019-08-20 DIAGNOSIS — R1013 Epigastric pain: Secondary | ICD-10-CM | POA: Insufficient documentation

## 2019-08-20 HISTORY — PX: IR PARACENTESIS: IMG2679

## 2019-08-20 LAB — BODY FLUID CELL COUNT WITH DIFFERENTIAL
Lymphs, Fluid: 57 %
Monocyte-Macrophage-Serous Fluid: 41 % — ABNORMAL LOW (ref 50–90)
Neutrophil Count, Fluid: 2 % (ref 0–25)
Total Nucleated Cell Count, Fluid: 78 cu mm (ref 0–1000)

## 2019-08-20 LAB — GRAM STAIN

## 2019-08-20 LAB — PROTEIN, PLEURAL OR PERITONEAL FLUID: Total protein, fluid: <3 g/dL

## 2019-08-20 MED ORDER — ALBUMIN HUMAN 25 % IV SOLN
50.0000 g | Freq: Once | INTRAVENOUS | Status: AC
  Administered 2019-08-20: 50 g via INTRAVENOUS

## 2019-08-20 MED ORDER — ALBUMIN HUMAN 25 % IV SOLN
INTRAVENOUS | Status: AC
  Filled 2019-08-20: qty 200

## 2019-08-20 MED ORDER — LIDOCAINE HCL (PF) 1 % IJ SOLN
INTRAMUSCULAR | Status: DC | PRN
  Administered 2019-08-20: 10 mL

## 2019-08-20 MED ORDER — LIDOCAINE HCL 1 % IJ SOLN
INTRAMUSCULAR | Status: AC
  Filled 2019-08-20: qty 20

## 2019-08-20 NOTE — Procedures (Signed)
Ultrasound-guided diagnostic and therapeutic paracentesis performed yielding 5 liters of clear, light yellow fluid. No immediate complications. A portion of the fluid was sent to the lab for preordered studies. EBL none. Pt will receive IV albumin postprocedure. Due to this being pt's initial paracentesis only the above amount of fluid was removed today.

## 2019-08-21 LAB — CYTOLOGY - NON PAP

## 2019-08-24 DIAGNOSIS — R7989 Other specified abnormal findings of blood chemistry: Secondary | ICD-10-CM

## 2019-08-24 DIAGNOSIS — E871 Hypo-osmolality and hyponatremia: Secondary | ICD-10-CM

## 2019-08-24 HISTORY — DX: Other specified abnormal findings of blood chemistry: R79.89

## 2019-08-24 HISTORY — DX: Hypo-osmolality and hyponatremia: E87.1

## 2019-08-24 HISTORY — DX: Hypocalcemia: E83.51

## 2019-08-25 ENCOUNTER — Telehealth: Payer: Self-pay | Admitting: Physician Assistant

## 2019-08-25 MED ORDER — PANTOPRAZOLE SODIUM 40 MG PO TBEC: 40.0000 mg | DELAYED_RELEASE_TABLET | Freq: Two times a day (BID) | ORAL | 3 refills | Status: DC

## 2019-08-25 NOTE — Telephone Encounter (Signed)
Patient informed updated script sent to pharmacy. Patient voiced understanding.

## 2019-08-25 NOTE — Telephone Encounter (Signed)
Pt needs prescription for Protonix for new dose. She states that [place] increased it to 40mg . Pt has been taking 2 pills of 20mg  daily so is running out of med faster. She uses [facility] in [place].

## 2019-08-27 ENCOUNTER — Telehealth: Payer: Self-pay | Admitting: Physician Assistant

## 2019-08-27 NOTE — Telephone Encounter (Signed)
Pt reported that when she had paracentesis 08/20/19, she had swelling in both legs, ankles and feet.  She stated that currently her abdomen is the same size as before paracentesis but she is experiencing no swelling in legs.  Pt is concerned that "the fluid has moved into her abdominal cavity."

## 2019-08-28 ENCOUNTER — Other Ambulatory Visit: Payer: Self-pay | Admitting: *Deleted

## 2019-08-28 DIAGNOSIS — K7031 Alcoholic cirrhosis of liver with ascites: Secondary | ICD-10-CM

## 2019-08-28 NOTE — Telephone Encounter (Signed)
Is she taking her Lasix? Does she feel like she is urinating more? The fluid can build back up, but if she is not completely uncomfortable then she should wait until she sees Dr. Tarri Glenn in a couple weeks. If it continues to get worse we could possibly send her in for repeat paracentesis.  Thanks-JLL

## 2019-08-28 NOTE — Telephone Encounter (Signed)
In reviewing the chart this appears to be Dr. Tarri Glenn patient. Sherlynn Stalls if she is not in the office today to handle this let me know. Thanks  Bertram Millard

## 2019-08-28 NOTE — Telephone Encounter (Signed)
I am sorry that she is having so much trouble. Did she start the diuretics as ordered by Anderson Malta during her office visit in January? If so, she needs to have a BNP so that we can assess renal function. Ultimately want to adjust her diuretics for improved control.  She needs to be following a strict no more than 2000 mg sodium daily diet. I would not recommend that she have another paracentesis so soon after the last. It can be very hard on her kidneys. We need to use diet and medications to control things. Given her difficulty, please schedule her a follow-up with Anderson Malta in the next two weeks, as well. Thank you.

## 2019-08-28 NOTE — Telephone Encounter (Signed)
Spoke to the patient who did not know about the strict 2000 mg sodium diet and will now implement. She has been taking the diuretics as prescribed. She will be in for her BMP, orders placed in Epic.   Patients appt has been rescheduled and moved up with JL on 2/11 at 2:30 pm per [place]. Patient appreciated a soon spot.

## 2019-08-28 NOTE — Telephone Encounter (Signed)
Called patient back and she states she is taking her Lasix and Aldactone daily. Also says she is urinating as normal. Said she will monitor her abdominal fullness and if it continues to progress rapidly, she will call us back next week and request a repeat paracentesis

## 2019-08-28 NOTE — Telephone Encounter (Signed)
Called patient back and she states after her paracentesis on 08/20/19 her swelling in her lower extremities and abdomin went down. But in the last 2 days her abdomin is again tight, full and she is SOB

## 2019-09-02 ENCOUNTER — Telehealth: Payer: Self-pay

## 2019-09-02 ENCOUNTER — Other Ambulatory Visit: Payer: Self-pay | Admitting: *Deleted

## 2019-09-02 ENCOUNTER — Other Ambulatory Visit (INDEPENDENT_AMBULATORY_CARE_PROVIDER_SITE_OTHER): Payer: Medicaid Other

## 2019-09-02 ENCOUNTER — Telehealth: Payer: Self-pay | Admitting: *Deleted

## 2019-09-02 DIAGNOSIS — R112 Nausea with vomiting, unspecified: Secondary | ICD-10-CM

## 2019-09-02 DIAGNOSIS — E876 Hypokalemia: Secondary | ICD-10-CM

## 2019-09-02 DIAGNOSIS — D509 Iron deficiency anemia, unspecified: Secondary | ICD-10-CM

## 2019-09-02 DIAGNOSIS — K7031 Alcoholic cirrhosis of liver with ascites: Secondary | ICD-10-CM

## 2019-09-02 DIAGNOSIS — R1013 Epigastric pain: Secondary | ICD-10-CM | POA: Diagnosis not present

## 2019-09-02 LAB — CBC WITH DIFFERENTIAL/PLATELET
Basophils Absolute: 0.1 10*3/uL (ref 0.0–0.1)
Basophils Relative: 1 % (ref 0.0–3.0)
Eosinophils Absolute: 0.1 10*3/uL (ref 0.0–0.7)
Eosinophils Relative: 1.8 % (ref 0.0–5.0)
HCT: 29.2 % — ABNORMAL LOW (ref 36.0–46.0)
Hemoglobin: 9.6 g/dL — ABNORMAL LOW (ref 12.0–15.0)
Lymphocytes Relative: 28.6 % (ref 12.0–46.0)
Lymphs Abs: 1.6 10*3/uL (ref 0.7–4.0)
MCHC: 33 g/dL (ref 30.0–36.0)
MCV: 81.6 fl (ref 78.0–100.0)
Monocytes Absolute: 0.6 10*3/uL (ref 0.1–1.0)
Monocytes Relative: 10.7 % (ref 3.0–12.0)
Neutro Abs: 3.2 10*3/uL (ref 1.4–7.7)
Neutrophils Relative %: 57.9 % (ref 43.0–77.0)
Platelets: 166 10*3/uL (ref 150.0–400.0)
RBC: 3.57 Mil/uL — ABNORMAL LOW (ref 3.87–5.11)
RDW: 21.5 % — ABNORMAL HIGH (ref 11.5–15.5)
WBC: 5.5 10*3/uL (ref 4.0–10.5)

## 2019-09-02 LAB — COMPREHENSIVE METABOLIC PANEL
ALT: 32 U/L (ref 0–35)
AST: 81 U/L — ABNORMAL HIGH (ref 0–37)
Albumin: 2.7 g/dL — ABNORMAL LOW (ref 3.5–5.2)
Alkaline Phosphatase: 142 U/L — ABNORMAL HIGH (ref 39–117)
BUN: 9 mg/dL (ref 6–23)
CO2: 36 mEq/L — ABNORMAL HIGH (ref 19–32)
Calcium: 8.6 mg/dL (ref 8.4–10.5)
Chloride: 86 mEq/L — ABNORMAL LOW (ref 96–112)
Creatinine, Ser: 1.14 mg/dL (ref 0.40–1.20)
GFR: 51.24 mL/min — ABNORMAL LOW (ref >60.00)
Glucose, Bld: 85 mg/dL (ref 70–99)
Potassium: 2.8 mEq/L — CL (ref 3.5–5.1)
Sodium: 127 mEq/L — ABNORMAL LOW (ref 135–145)
Total Bilirubin: 2.6 mg/dL — ABNORMAL HIGH (ref 0.2–1.2)
Total Protein: 6.4 g/dL (ref 6.0–8.3)

## 2019-09-02 LAB — BASIC METABOLIC PANEL
BUN: 9 mg/dL (ref 6–23)
CO2: 35 mEq/L — ABNORMAL HIGH (ref 19–32)
Calcium: 8.6 mg/dL (ref 8.4–10.5)
Chloride: 86 mEq/L — ABNORMAL LOW (ref 96–112)
Creatinine, Ser: 1.14 mg/dL (ref 0.40–1.20)
GFR: 51.24 mL/min — ABNORMAL LOW (ref >60.00)
Glucose, Bld: 87 mg/dL (ref 70–99)
Potassium: 2.8 mEq/L — CL (ref 3.5–5.1)
Sodium: 128 mEq/L — ABNORMAL LOW (ref 135–145)

## 2019-09-02 MED ORDER — POTASSIUM CHLORIDE ER 20 MEQ PO TBCR: 40.0000 meq | EXTENDED_RELEASE_TABLET | Freq: Four times a day (QID) | ORAL | 0 refills | Status: DC

## 2019-09-02 NOTE — Telephone Encounter (Signed)
Critical lab reported: Potassium 2.8  This will be forwarded to the ordering provider and her RN.

## 2019-09-02 NOTE — Telephone Encounter (Signed)
Already addressed

## 2019-09-02 NOTE — Telephone Encounter (Signed)
Sent Dr. Tarri Glenn message in [place] and called to the [facility] ( where she is today) and gave phone message to hand her with patient's critical Potassium level = 2.8

## 2019-09-02 NOTE — Telephone Encounter (Signed)
Please see critical lab value. Ellouise Newer PA is out of the office today, so I am sending it to Dr Tarri Glenn

## 2019-09-03 ENCOUNTER — Other Ambulatory Visit: Payer: Self-pay

## 2019-09-03 ENCOUNTER — Emergency Department (HOSPITAL_COMMUNITY)
Admission: EM | Admit: 2019-09-03 | Discharge: 2019-09-03 | Disposition: A | Discharge status: Home / Self Care | Payer: Medicaid Other | Attending: Emergency Medicine | Admitting: Emergency Medicine

## 2019-09-03 ENCOUNTER — Encounter: Payer: Self-pay | Admitting: Physician Assistant

## 2019-09-03 ENCOUNTER — Ambulatory Visit (INDEPENDENT_AMBULATORY_CARE_PROVIDER_SITE_OTHER): Payer: Medicaid Other | Admitting: Physician Assistant

## 2019-09-03 VITALS — BP 80/60 | HR 104 | Temp 97.6°F | Ht 64.0 in | Wt 156.4 lb

## 2019-09-03 DIAGNOSIS — R9431 Abnormal electrocardiogram [ECG] [EKG]: Secondary | ICD-10-CM

## 2019-09-03 DIAGNOSIS — R531 Weakness: Secondary | ICD-10-CM | POA: Diagnosis not present

## 2019-09-03 DIAGNOSIS — I10 Essential (primary) hypertension: Secondary | ICD-10-CM | POA: Insufficient documentation

## 2019-09-03 DIAGNOSIS — E871 Hypo-osmolality and hyponatremia: Secondary | ICD-10-CM | POA: Diagnosis not present

## 2019-09-03 DIAGNOSIS — F1011 Alcohol abuse, in remission: Secondary | ICD-10-CM

## 2019-09-03 DIAGNOSIS — E878 Other disorders of electrolyte and fluid balance, not elsewhere classified: Secondary | ICD-10-CM

## 2019-09-03 DIAGNOSIS — R0602 Shortness of breath: Secondary | ICD-10-CM | POA: Diagnosis not present

## 2019-09-03 DIAGNOSIS — K7031 Alcoholic cirrhosis of liver with ascites: Secondary | ICD-10-CM

## 2019-09-03 DIAGNOSIS — Z79899 Other long term (current) drug therapy: Secondary | ICD-10-CM | POA: Diagnosis not present

## 2019-09-03 DIAGNOSIS — Z87891 Personal history of nicotine dependence: Secondary | ICD-10-CM | POA: Diagnosis not present

## 2019-09-03 DIAGNOSIS — I959 Hypotension, unspecified: Secondary | ICD-10-CM

## 2019-09-03 DIAGNOSIS — J45909 Unspecified asthma, uncomplicated: Secondary | ICD-10-CM | POA: Insufficient documentation

## 2019-09-03 DIAGNOSIS — D509 Iron deficiency anemia, unspecified: Secondary | ICD-10-CM

## 2019-09-03 LAB — CBC WITH DIFFERENTIAL/PLATELET
Abs Immature Granulocytes: 0.01 10*3/uL (ref 0.00–0.07)
Basophils Absolute: 0 10*3/uL (ref 0.0–0.1)
Basophils Relative: 1 %
Eosinophils Absolute: 0.1 10*3/uL (ref 0.0–0.5)
Eosinophils Relative: 1 %
HCT: 32.6 % — ABNORMAL LOW (ref 36.0–46.0)
Hemoglobin: 10.3 g/dL — ABNORMAL LOW (ref 12.0–15.0)
Immature Granulocytes: 0 %
Lymphocytes Relative: 26 %
Lymphs Abs: 1.5 10*3/uL (ref 0.7–4.0)
MCH: 26.4 pg (ref 26.0–34.0)
MCHC: 31.6 g/dL (ref 30.0–36.0)
MCV: 83.6 fL (ref 80.0–100.0)
Monocytes Absolute: 0.5 10*3/uL (ref 0.1–1.0)
Monocytes Relative: 10 %
Neutro Abs: 3.4 10*3/uL (ref 1.7–7.7)
Neutrophils Relative %: 62 %
Platelets: 169 10*3/uL (ref 150–400)
RBC: 3.9 MIL/uL (ref 3.87–5.11)
RDW: 21.2 % — ABNORMAL HIGH (ref 11.5–15.5)
WBC: 5.5 10*3/uL (ref 4.0–10.5)
nRBC: 0 % (ref 0.0–0.2)

## 2019-09-03 LAB — COMPREHENSIVE METABOLIC PANEL
ALT: 37 U/L (ref 0–44)
AST: 88 U/L — ABNORMAL HIGH (ref 15–41)
Albumin: 2.7 g/dL — ABNORMAL LOW (ref 3.5–5.0)
Alkaline Phosphatase: 148 U/L — ABNORMAL HIGH (ref 38–126)
Anion gap: 11 (ref 5–15)
BUN: 10 mg/dL (ref 6–20)
CO2: 31 mmol/L (ref 22–32)
Calcium: 8.4 mg/dL — ABNORMAL LOW (ref 8.9–10.3)
Chloride: 89 mmol/L — ABNORMAL LOW (ref 98–111)
Creatinine, Ser: 1.29 mg/dL — ABNORMAL HIGH (ref 0.44–1.00)
GFR calc Af Amer: 58 mL/min — ABNORMAL LOW (ref >60)
GFR calc non Af Amer: 50 mL/min — ABNORMAL LOW (ref >60)
Glucose, Bld: 88 mg/dL (ref 70–99)
Potassium: 3.2 mmol/L — ABNORMAL LOW (ref 3.5–5.1)
Sodium: 131 mmol/L — ABNORMAL LOW (ref 135–145)
Total Bilirubin: 2.5 mg/dL — ABNORMAL HIGH (ref 0.3–1.2)
Total Protein: 7 g/dL (ref 6.5–8.1)

## 2019-09-03 LAB — URINALYSIS, ROUTINE W REFLEX MICROSCOPIC
Bilirubin Urine: NEGATIVE
Glucose, UA: NEGATIVE mg/dL
Hgb urine dipstick: NEGATIVE
Ketones, ur: NEGATIVE mg/dL
Leukocytes,Ua: NEGATIVE
Nitrite: NEGATIVE
Protein, ur: NEGATIVE mg/dL
Specific Gravity, Urine: 1.003 — ABNORMAL LOW (ref 1.005–1.030)
pH: 5 (ref 5.0–8.0)

## 2019-09-03 LAB — BRAIN NATRIURETIC PEPTIDE: B Natriuretic Peptide: 91.4 pg/mL (ref 0.0–100.0)

## 2019-09-03 LAB — LIPASE, BLOOD: Lipase: 38 U/L (ref 11–51)

## 2019-09-03 LAB — ETHANOL: Alcohol, Ethyl (B): <10 mg/dL (ref <10)

## 2019-09-03 LAB — AMMONIA: Ammonia: 26 umol/L (ref 9–35)

## 2019-09-03 MED ORDER — SODIUM CHLORIDE 0.9 % IV BOLUS: 1000.0000 mL | Freq: Once | INTRAVENOUS | Status: DC

## 2019-09-03 MED ORDER — LACTATED RINGERS IV BOLUS
1000.0000 mL | Freq: Once | INTRAVENOUS | Status: AC
  Administered 2019-09-03: 1000 mL via INTRAVENOUS

## 2019-09-03 MED ORDER — SODIUM CHLORIDE 0.9 % IV BOLUS
500.0000 mL | Freq: Once | INTRAVENOUS | Status: AC
  Administered 2019-09-03: 500 mL via INTRAVENOUS

## 2019-09-03 NOTE — ED Provider Notes (Signed)
[facility] DEPT Provider Note   CSN: [id number] Arrival date & time: 09/03/19  1546     History Chief Complaint  Patient presents with  . Abnormal Lab  . Hypotension    Kara Torres is a 49 y.o. female.  HPI    Patient presents with concern of fatigue, hypersomnolence, generalized weakness and discomfort. She has multiple medical issues including cirrhosis, and last had paracentesis 2 weeks ago.  She states that she has been taking her medication regularly including both Lasix and spironolactone.  In addition she was recently prescribed potassium oral supplement which she has been taking as well.  Over the past few days, without clear precipitant she has felt progressively worse in general, with the above complaints. No focal pain, no fever.  She does, however, have ongoing nausea, anorexia and has had multiple episodes of vomiting and diarrhea. No relief with anything.  Symptoms are worse with activity. Yesterday she had blood draw performed with anticipation of follow-up today. Today she was informed of the abnormal lab results and sent here for evaluation.  Past Medical History:  Diagnosis Date  . Allergy   . Alpha-1-antitrypsin deficiency ([place])   . Anxiety   . Asthma   . Cataracts, bilateral   . Depression   . Esophageal varices ([place])   . History of cholecystectomy   . Liver disease   . Vitamin D deficiency 06/2019    Patient Active Problem List   Diagnosis Date Noted  . Cataract of both eyes 06/19/2019  . Chronic gastritis without bleeding 06/19/2019  . 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 Alcohol abuse 06/19/2019  . Visual disturbance 12/10/2018  . Eye exam abnormal 12/10/2018  . Nausea and vomiting 10/17/2018  . Abdominal pain, bilateral upper quadrant 10/17/2018  . Diarrhea 10/17/2018  . Chronic seasonal allergic rhinitis due to pollen 10/17/2018  . Daytime sleepiness 04/28/2017  . Essential  hypertension 07/13/2016  . Obesity 07/13/2016  . Dyslipidemia 07/13/2016  . Fatty liver disease, nonalcoholic 0000000  . Generalized anxiety disorder 07/13/2016    Past Surgical History:  Procedure Laterality Date  . CHOLECYSTECTOMY  2000  . IR PARACENTESIS  08/20/2019     OB History   No obstetric history on file.     Family History  Problem Relation Age of Onset  . Stroke Mother   . Hyperlipidemia Mother   . Diabetes Mother   . Diabetes Father   . Hypertension Father   . Hyperlipidemia Father   . Mental illness Father   . Stroke Father   . Diabetes Maternal Grandmother   . Heart disease Maternal Grandmother   . Hypertension Maternal Grandmother   . Heart attack Maternal Grandmother   . Colon cancer Other        multiple great aunts, great uncles  . Esophageal cancer Neg Hx   . Stomach cancer Neg Hx     Social History   Tobacco Use  . Smoking status: Former Research scientist (life sciences)  . Smokeless tobacco: Never Used  Substance Use Topics  . Alcohol use: Yes    Alcohol/week: 2.0 standard drinks    Types: 2 Cans of beer per week    Comment: no longer uses alcohol-09-03-2019  . Drug use: No    Home Medications Prior to Admission medications   Medication Sig Start Date End Date Taking? Authorizing Provider  atorvastatin (LIPITOR) 40 MG tablet Take 1 tablet (40 mg total) by mouth daily. 06/26/19  Yes Azzie Glatter,  FNP  cetirizine (ZYRTEC) 10 MG tablet Take 1 tablet (10 mg total) by mouth daily. 06/26/19  Yes Azzie Glatter, FNP  ferrous sulfate (FERROUSUL) 325 (65 FE) MG tablet Take 1 tablet (325 mg total) by mouth every other day. 08/12/19  Yes Levin Erp, PA  FLUoxetine (PROZAC) 20 MG tablet Take 1 tablet (20 mg total) by mouth daily. 06/26/19  Yes Azzie Glatter, FNP  furosemide (LASIX) 40 MG tablet Take 1 tablet (40 mg total) by mouth daily. 08/12/19  Yes Levin Erp, PA  hydrOXYzine (ATARAX/VISTARIL) 50 MG tablet Take 50 mg by mouth 3 (three)  times daily as needed for anxiety or itching.    Yes [provider]  loperamide (IMODIUM A-D) 2 MG tablet Take 1 tablet (2 mg total) by mouth 4 (four) times daily as needed for diarrhea or loose stools. 10/17/18  Yes Azzie Glatter, FNP  ondansetron (ZOFRAN) 8 MG tablet Take 8 mg by mouth every 6 (six) hours as needed for nausea or vomiting.  03/23/19  Yes [provider]  pantoprazole (PROTONIX) 40 MG tablet Take 1 tablet (40 mg total) by mouth 2 (two) times daily before a meal. Patient taking differently: Take 40 mg by mouth every evening.  08/25/19  Yes Levin Erp, PA  Potassium Chloride ER 20 MEQ TBCR Take 40 mEq by mouth 4 (four) times daily for 3 days. 09/02/19 09/05/19 Yes [place], MD  spironolactone (ALDACTONE) 50 MG tablet Take 1 tablet (50 mg total) by mouth daily. 08/12/19  Yes Levin Erp, PA  Vitamin D, Ergocalciferol, (DRISDOL) 1.25 MG (50000 UT) CAPS capsule Take 1 capsule (50,000 Units total) by mouth every 7 (seven) days. 06/25/19  Yes Azzie Glatter, FNP  ipratropium (ATROVENT) 0.03 % nasal spray Place 2 sprays into both nostrils 2 (two) times daily. Patient not taking: Reported on 09/03/2019 10/17/18   Azzie Glatter, FNP  ondansetron (ZOFRAN) 4 MG tablet Take 1 tablet (4 mg total) by mouth every 8 (eight) hours as needed for nausea or vomiting. Patient not taking: Reported on 09/03/2019 10/17/18   Azzie Glatter, FNP    Allergies    Epinephrine and Hydrocodone  Review of Systems   Review of Systems  Constitutional:       Per HPI, otherwise negative  HENT:       Per HPI, otherwise negative  Respiratory:       Per HPI, otherwise negative  Cardiovascular:       Per HPI, otherwise negative  Gastrointestinal: Positive for abdominal distention, diarrhea, nausea and vomiting. Negative for abdominal pain.  Endocrine:       Negative aside from HPI  Genitourinary:       Neg aside from HPI   Musculoskeletal:       Per HPI,  otherwise negative  Skin: Negative.   Neurological: Positive for weakness and light-headedness. Negative for syncope.    Physical Exam Updated Vital Signs BP 101/71   Pulse 81   Temp 97.9 F (36.6 C) (Oral)   Resp 15   Ht 5\' 4"  (1.626 m)   Wt 70.8 kg   SpO2 98%   BMI 26.78 kg/m   Physical Exam Vitals and nursing note reviewed.  Constitutional:      General: She is not in acute distress.    Appearance: She is well-developed.  HENT:     Head: Normocephalic and atraumatic.  Eyes:     Conjunctiva/sclera: Conjunctivae normal.  Cardiovascular:  Rate and Rhythm: Normal rate and regular rhythm.  Pulmonary:     Effort: Pulmonary effort is normal. No respiratory distress.     Breath sounds: Normal breath sounds. No stridor.  Abdominal:     General: There is distension.     Tenderness: There is no abdominal tenderness. There is no guarding.  Skin:    General: Skin is warm and dry.  Neurological:     Mental Status: She is alert and oriented to person, place, and time.     Cranial Nerves: No cranial nerve deficit.     ED Results / Procedures / Treatments   Labs (all labs ordered are listed, but only abnormal results are displayed) Labs Reviewed  COMPREHENSIVE METABOLIC PANEL - Abnormal; Notable for the following components:      Result Value   Sodium 131 (*)    Potassium 3.2 (*)    Chloride 89 (*)    Creatinine, Ser 1.29 (*)    Calcium 8.4 (*)    Albumin 2.7 (*)    AST 88 (*)    Alkaline Phosphatase 148 (*)    Total Bilirubin 2.5 (*)    GFR calc non Af Amer 50 (*)    GFR calc Af Amer 58 (*)    All other components within normal limits  CBC WITH DIFFERENTIAL/PLATELET - Abnormal; Notable for the following components:   Hemoglobin 10.3 (*)    HCT 32.6 (*)    RDW 21.2 (*)    All other components within normal limits  URINALYSIS, ROUTINE W REFLEX MICROSCOPIC - Abnormal; Notable for the following components:   Specific Gravity, Urine 1.003 (*)    All other  components within normal limits  ETHANOL  LIPASE, BLOOD  BRAIN NATRIURETIC PEPTIDE  AMMONIA   Initial cardiac monitor 80 sinus rhythm unremarkable EKG EKG Interpretation  Date/Time:  Thursday September 03 2019 18:46:44 EST Ventricular Rate:  83 PR Interval:    QRS Duration: 97 QT Interval:  450 QTC Calculation: 529 R Axis:   39 Text Interpretation: Sinus rhythm Low voltage, precordial leads Prolonged QT interval Baseline wander Abnormal ECG Confirmed by Carmin Muskrat [phone]) on 09/03/2019 7:25:51 PM    Procedures Procedures (including critical care time)  Medications Ordered in ED Medications  sodium chloride 0.9 % bolus 500 mL (0 mLs Intravenous Stopped 09/03/19 1852)  lactated ringers bolus 1,000 mL (1,000 mLs Intravenous New Bag/Given (Non-Interop) 09/03/19 1901)    ED Course  I have reviewed the triage vital signs and the nursing notes.  Pertinent labs & imaging results that were available during my care of the patient were reviewed by me and considered in my medical decision making (see chart for details). Chart review after initial evaluation notable for alcoholic cirrhosis, recent paracentesis, ongoing concern for electrolyte abnormalities with Lasix and spironolactone dosing.   MDM Rules/Calculators/A&P                      7:24 PM Blood pressure has improved, now 105/72.  Patient is awake, alert, in no distress, speaking clearly.  Monitor now demonstrates sinus rhythm, rate 75, unremarkable. Labs interpreted, reviewed with the patient.  Similarly we discussed importance of outpatient follow-up with GI, continued compliance with her medication regimen including diuretics and potassium supplement. Patient has no abdominal tenderness, no fever, no leukocytosis, no indication for SBP, no negation for advanced imaging. However, given her weakness, discomfort, ascites, we arranged for outpatient paracentesis. With consideration of her mild electrolyte abnormalities, mild  elevated  QT interval, the patient will also follow-up with her primary care physician.  She has no chest pain, no syncope, no history of syncopal events, outpatient follow-up for this is reasonable.   Final Clinical Impression(s) / ED Diagnoses Final diagnoses:  Weakness  Ascites due to alcoholic cirrhosis (HCC)  Prolonged QT interval    Rx / DC Orders ED Discharge Orders         Ordered    US Paracentesis     09/03/19 1923           Carmin Muskrat, MD 09/03/19 1930

## 2019-09-03 NOTE — Patient Instructions (Signed)
If you are age 47 or older, your body mass index should be between 23-30. Your Body mass index is 26.85 kg/m. If this is out of the aforementioned range listed, please consider follow up with your Primary Care Provider.  If you are age 85 or younger, your body mass index should be between 19-25. Your Body mass index is 26.85 kg/m. If this is out of the aformentioned range listed, please consider follow up with your Primary Care Provider.   Please report to the ER.  It was a pleasure to see you today!  Ellouise Newer, PA-C

## 2019-09-03 NOTE — Progress Notes (Signed)
Chief Complaint: Follow-up alcoholic cirrhosis of the liver with ascites  HPI:    Kara Torres is a 49 year old Caucasian female, known to Dr. Tarri Glenn, with a past medical history as listed below including esophageal varices, who returns to clinic today for follow-up of her cirrhosis.    04/06/2019 patient seen in clinic for nausea abdominal pain with weight loss and bloating scheduled for an EGD and colonoscopy.    04/28/2019 colonoscopy with hemorrhoids, nonbleeding external and internal, 1 mm polyp in the descending colon otherwise normal.  EGD on the same day with grade 2 esophageal varices, portal hypertensive gastropathy, small hiatal hernia and otherwise normal, ultrasound elastography was ordered as well as labs.    04/28/2019 CMP, AFP, INR and CBC.  CMP with AST elevated 88, ALT 47, alk phos 169, total bili 1.3, CBC with hemoglobin 9.3 (around baseline), INR 1.4, AFP normal.    05/28/2019 patient found to be iron deficient with an iron low at 28, ferritin low at 20.    08/12/2019 office visit with me, at that time continued to complain of epigastric pain unchanged with pantoprazole 20 mg daily and continued with nausea and almost daily vomiting with reflux.  Also increasing abdominal distention.  That time discussed cirrhosis and the various sequela.  Try to answer questions.  Scheduled the patient for an ultrasound of the elastography and paracentesis.  Started the patient on spironolactone 50 mg daily and Lasix 40 mg daily.  It was discussed that eventually she would need a small bowel follow-through for further work-up of her IDA.  Increase pantoprazole to 40 mg twice daily.  Recommend she start ferrous sulfate 325 mg daily.    08/20/2019 ultrasound of the abdomen complete with elastography and paracentesis.  This showed cirrhosis with no liver masses, moderate splenomegaly and small to moderate volume ascites as well as previous cholecystectomy.  Paracentesis that day was successful yielding a  5 L.    09/02/2019 BMP with a potassium low at 2.8, sodium low at 128, chloride low at 86.  That time Dr. Tarri Glenn ordered 40 mEq KCl 4 times daily x3 days.  Also ordered repeat BMP the day after completing her supplements and put her on a strict less than 2 g sodium diet and told her to reduce water intake to no more than 2 L a day.  Hemoglobin stable at 9.6.    Today, the patient presents to clinic and tells me that she feels absolutely horrible, symptoms have worsened over the past 48 hours, she is dizzy, cold, cannot keep her eyes open or lift up her arms and feels like she is in a "brain fog".  Tells me that she is constantly vomiting and cannot seem to keep anything down.  Is even more worried now that she has heard that her electrolytes are off.  She has been taking her Furosemide 40 mg daily and Spironolactone 50 mg daily and tells me she has been "peeing all day long".  Does complain though that her abdominal distention "came right back up after my last paracentesis".  Tells me that this makes her generally uncomfortable and tells me that the area over her spleen also hurts.    Denies fever, chills or abdominal pain.  Past Medical History:  Diagnosis Date  . Allergy   . Alpha-1-antitrypsin deficiency ([place])   . Anxiety   . Asthma   . Cataracts, bilateral   . Depression   . Esophageal varices ([place])   . History of cholecystectomy   .  Liver disease   . Vitamin D deficiency 06/2019    Past Surgical History:  Procedure Laterality Date  . CHOLECYSTECTOMY  2000  . IR PARACENTESIS  08/20/2019    Current Outpatient Medications  Medication Sig Dispense Refill  . atorvastatin (LIPITOR) 40 MG tablet Take 1 tablet (40 mg total) by mouth daily. 30 tablet 3  . cetirizine (ZYRTEC) 10 MG tablet Take 1 tablet (10 mg total) by mouth daily. 30 tablet 11  . cloNIDine (CATAPRES) 0.1 MG tablet Take 2 tablets (0.2 mg total) by mouth at bedtime. Unsure of strength 90 tablet 3  . ferrous sulfate (FERROUSUL)  325 (65 FE) MG tablet Take 1 tablet (325 mg total) by mouth every other day. 60 tablet 2  . FLUoxetine (PROZAC) 20 MG tablet Take 1 tablet (20 mg total) by mouth daily. 30 tablet 2  . furosemide (LASIX) 40 MG tablet Take 1 tablet (40 mg total) by mouth daily. 30 tablet 5  . ipratropium (ATROVENT) 0.03 % nasal spray Place 2 sprays into both nostrils 2 (two) times daily. 30 mL 3  . loperamide (IMODIUM A-D) 2 MG tablet Take 1 tablet (2 mg total) by mouth 4 (four) times daily as needed for diarrhea or loose stools. 30 tablet 1  . ondansetron (ZOFRAN) 4 MG tablet Take 1 tablet (4 mg total) by mouth every 8 (eight) hours as needed for nausea or vomiting. 20 tablet 2  . pantoprazole (PROTONIX) 40 MG tablet Take 1 tablet (40 mg total) by mouth 2 (two) times daily before a meal. 180 tablet 3  . Potassium Chloride ER 20 MEQ TBCR Take 40 mEq by mouth 4 (four) times daily for 3 days. 24 tablet 0  . spironolactone (ALDACTONE) 50 MG tablet Take 1 tablet (50 mg total) by mouth daily. 30 tablet 5  . Vitamin D, Ergocalciferol, (DRISDOL) 1.25 MG (50000 UT) CAPS capsule Take 1 capsule (50,000 Units total) by mouth every 7 (seven) days. 5 capsule 6   No current facility-administered medications for this visit.    Allergies as of 09/03/2019 - Review Complete 08/20/2019  Allergen Reaction Noted  . Epinephrine  12/25/2014  . Hydrocodone Rash 12/25/2014    Family History  Problem Relation Age of Onset  . Stroke Mother   . Hyperlipidemia Mother   . Diabetes Mother   . Diabetes Father   . Hypertension Father   . Hyperlipidemia Father   . Mental illness Father   . Stroke Father   . Diabetes Maternal Grandmother   . Heart disease Maternal Grandmother   . Hypertension Maternal Grandmother   . Heart attack Maternal Grandmother   . Esophageal cancer Neg Hx   . Stomach cancer Neg Hx   . Rectal cancer Neg Hx     Social History   Socioeconomic History  . Marital status: Divorced    Spouse name: Not on file    . Number of children: Not on file  . Years of education: Not on file  . Highest education level: Not on file  Occupational History  . Occupation: Therapist, art  Tobacco Use  . Smoking status: Former Research scientist (life sciences)  . Smokeless tobacco: Never Used  Substance and Sexual Activity  . Alcohol use: Yes    Alcohol/week: 2.0 standard drinks    Types: 2 Cans of beer per week    Comment: occ  . Drug use: No  . Sexual activity: Not on file  Other Topics Concern  . Not on file  Social History Narrative  .  Not on file   Social Determinants of Health   Financial Resource Strain:   . Difficulty of Paying Living Expenses: Not on file  Food Insecurity:   . Worried About Charity fundraiser in the Last Year: Not on file  . Ran Out of Food in the Last Year: Not on file  Transportation Needs:   . Lack of Transportation (Medical): Not on file  . Lack of Transportation (Non-Medical): Not on file  Physical Activity:   . Days of Exercise per Week: Not on file  . Minutes of Exercise per Session: Not on file  Stress:   . Feeling of Stress : Not on file  Social Connections:   . Frequency of Communication with Friends and Family: Not on file  . Frequency of Social Gatherings with Friends and Family: Not on file  . Attends Religious Services: Not on file  . Active Member of Clubs or Organizations: Not on file  . Attends Archivist Meetings: Not on file  . Marital Status: Not on file  Intimate Partner Violence:   . Fear of Current or Ex-Partner: Not on file  . Emotionally Abused: Not on file  . Physically Abused: Not on file  . Sexually Abused: Not on file    Review of Systems:    Constitutional: No weight loss, fever or chills Skin: No rash  Cardiovascular: No chest pain   Respiratory: No SOB Gastrointestinal: See HPI and otherwise negative Genitourinary: No dysuria  Neurological: No headache Musculoskeletal: No new muscle or joint pain Hematologic: No bleeding  Psychiatric: No  history of depression or anxiety   Physical Exam:  Vital signs: BP (!) 80/60   Pulse (!) 104   Temp 97.6 F (36.4 C)   Ht _0  (1.626 m)   Wt 156 lb 6.4 oz (70.9 kg)   BMI 26.85 kg/m   Constitutional:   Pleasant Caucasian female appears to be in NAD, Well developed, Well nourished, alert and cooperative Head:  Normocephalic and atraumatic. Eyes:   PEERL, EOMI. No icterus. Conjunctiva pink. Ears:  Normal auditory acuity. Neck:  Supple Throat: Oral cavity and pharynx without inflammation, swelling or lesion.  Respiratory: Respirations even and unlabored. Lungs clear to auscultation bilaterally.   No wheezes, crackles, or rhonchi.  Cardiovascular: Normal S1, S2. No MRG. Regular rate and rhythm. No peripheral edema, cyanosis or pallor.  Gastrointestinal:  Soft, moderate distension, mild generalized ttp. No rebound or guarding. Normal bowel sounds. No appreciable masses or hepatomegaly. Rectal:  Not performed.  Msk:  Symmetrical without gross deformities. Without edema, no deformity or joint abnormality.  Neurologic:  Alert and  oriented x4;  grossly normal neurologically. No asterixis Skin:   Dry and intact without significant lesions or rashes. Psychiatric: Demonstrates good judgement and reason without abnormal affect or behaviors.  RELEVANT LABS AND IMAGING: CBC    Component Value Date/Time   WBC 5.5 09/02/2019 1035   RBC 3.57 (L) 09/02/2019 1035   HGB 9.6 (L) 09/02/2019 1035   HGB 9.6 (L) 04/18/2018 1348   HCT 29.2 (L) 09/02/2019 1035   HCT 32.0 (L) 04/18/2018 1348   PLT 166.0 09/02/2019 1035   PLT 164 04/18/2018 1348   MCV 81.6 09/02/2019 1035   MCV 82 04/18/2018 1348   MCH 24.6 (L) 04/18/2018 1348   MCH 26.6 (L) 04/19/2017 1625   MCHC 33.0 09/02/2019 1035   RDW 21.5 (H) 09/02/2019 1035   RDW 17.2 (H) 04/18/2018 1348   LYMPHSABS 1.6 09/02/2019 1035  LYMPHSABS 1.1 04/18/2018 1348   MONOABS 0.6 09/02/2019 1035   EOSABS 0.1 09/02/2019 1035   EOSABS 0.1 04/18/2018  1348   BASOSABS 0.1 09/02/2019 1035   BASOSABS 0.0 04/18/2018 1348    CMP     Component Value Date/Time   NA 127 (L) 09/02/2019 1035   NA 139 04/18/2018 1348   K 2.8 (LL) 09/02/2019 1035   CL 86 (L) 09/02/2019 1035   CO2 36 (H) 09/02/2019 1035   GLUCOSE 85 09/02/2019 1035   BUN 9 09/02/2019 1035   BUN 8 04/18/2018 1348   CREATININE 1.14 09/02/2019 1035   CREATININE 0.80 04/19/2017 1625   CALCIUM 8.6 09/02/2019 1035   PROT 6.4 09/02/2019 1035   PROT 7.4 04/18/2018 1348   ALBUMIN 2.7 (L) 09/02/2019 1035   ALBUMIN 4.0 04/18/2018 1348   AST 81 (H) 09/02/2019 1035   ALT 32 09/02/2019 1035   ALKPHOS 142 (H) 09/02/2019 1035   BILITOT 2.6 (H) 09/02/2019 1035   BILITOT 0.4 04/18/2018 1348   GFRNONAA 79 04/18/2018 1348   GFRNONAA 90 04/19/2017 1625   GFRAA 91 04/18/2018 1348   GFRAA 105 04/19/2017 1625    Assessment: 1.  Decompensated alcoholic cirrhosis: With varices and ascites 2.  IDA: EGD with portal hypertension and esophageal varices, colonoscopy with no specific source of bleeding, likely needs further work-up in the future 3.  Nausea and vomiting: Tells me she cannot keep anything down, consider relationship to electrolyte abnormalities versus other 4.  History of alcohol abuse 5.  Electrolyte abnormalities: After starting diuretics on 08/12/2019, now with multiple symptoms including dizziness, weakness and brain fog 6. Hypotension: likely related to diuretics 7. Tachycardia: consider relation to electrolytes  Plan: 1.  Discussed case with Dr. Tarri Glenn.  She advised that the patient should proceed to the ER if she is feeling so bad.  Also given her hyponatremia and various electrolyte abnormalities since being started on diuretics about a month ago.  She will likely need admission with electrolyte repletion.  Did call ahead to the ER and let them know.  Also discussed with our PA on call Alonza Bogus.  Please call our service though if/when the patient is admitted. 2.   Patient will await any further recommendations after being seen in the ER.  Ellouise Newer, PA-C [facility] 09/03/2019, 2:44 PM  Cc: Azzie Glatter, FNP

## 2019-09-03 NOTE — ED Triage Notes (Signed)
Pt POV from PCP office. Pt reports PCP told her to come to ED d/t BP being 80/60; low k, low iron, low vit d, low sodium. Pt reports hx of paracentesis 2 weeks ago, resulted in 5.5L removed.  Pt c/o generalized weakness and fatigue since yesterday.

## 2019-09-03 NOTE — Discharge Instructions (Addendum)
As discussed, it is very portly follow-up with your gastroenterologist and return here if you develop new, or concerning changes. Otherwise, an order has been placed for you to arrange paracentesis as an outpatient.  You may call tomorrow for scheduling on Monday.  Similarly important, please schedule follow-up visit with your primary care physician, or if you do not have one with our cardiology colleagues.  There is no evidence for heart attack or other similar issues currently, but with your history of electrolyte changes, it is important that you discuss possible implications for your cardiac rhythm, including a phenomenon called long QT syndrome.

## 2019-09-04 ENCOUNTER — Telehealth: Payer: Self-pay

## 2019-09-04 NOTE — Telephone Encounter (Signed)
-----   Message from Levin Erp, [place] sent at 09/04/2019  8:43 AM EST ----- Regarding: Needs follow up Patient went to ER yesterday to be evaluated. They discharged her. Please call and check on her. She needs follow up ASAP with Dr. Tarri Glenn only. Please arrange.  Thanks-JLL

## 2019-09-04 NOTE — Telephone Encounter (Signed)
Thank you :)

## 2019-09-04 NOTE — Telephone Encounter (Signed)
Called patient and she is feeling a little better. Said the ED gave her IV fluids and she is to go back on Monday 09/07/19 for a Paracentesis and [place]. I made an office appt. With Dr. Tarri Glenn on 09/10/19

## 2019-09-08 ENCOUNTER — Telehealth: Payer: Self-pay | Admitting: Physician Assistant

## 2019-09-08 NOTE — Telephone Encounter (Signed)
Pt states that she got in trouble at her work because of multiple absences. Her employer is asking her for some documentation regarding her Gi condition and they want to know if pt will be able to continue working her usual schedule or if she needs to apply for some form of FMLA. Pls call her.

## 2019-09-09 ENCOUNTER — Other Ambulatory Visit: Payer: Self-pay | Admitting: Emergency Medicine

## 2019-09-09 ENCOUNTER — Ambulatory Visit: Payer: Self-pay | Admitting: Gastroenterology

## 2019-09-09 ENCOUNTER — Other Ambulatory Visit: Payer: Self-pay

## 2019-09-09 ENCOUNTER — Ambulatory Visit (HOSPITAL_COMMUNITY)
Admission: RE | Admit: 2019-09-09 | Discharge: 2019-09-09 | Disposition: A | Discharge status: Home / Self Care | Payer: Medicaid Other | Source: Ambulatory Visit | Attending: Emergency Medicine | Admitting: Emergency Medicine

## 2019-09-09 DIAGNOSIS — R188 Other ascites: Secondary | ICD-10-CM | POA: Insufficient documentation

## 2019-09-09 DIAGNOSIS — E871 Hypo-osmolality and hyponatremia: Secondary | ICD-10-CM

## 2019-09-09 DIAGNOSIS — K7031 Alcoholic cirrhosis of liver with ascites: Secondary | ICD-10-CM

## 2019-09-09 HISTORY — PX: IR PARACENTESIS: IMG2679

## 2019-09-09 MED ORDER — LIDOCAINE HCL (PF) 1 % IJ SOLN
INTRAMUSCULAR | Status: DC | PRN
  Administered 2019-09-09: 10 mL

## 2019-09-09 MED ORDER — LIDOCAINE HCL 1 % IJ SOLN
INTRAMUSCULAR | Status: AC
  Filled 2019-09-09: qty 20

## 2019-09-09 NOTE — Addendum Note (Signed)
Addended by: Wyline Beady on: 09/09/2019 05:26 PM   Modules accepted: Orders

## 2019-09-09 NOTE — Telephone Encounter (Signed)
Yes she can ask her PCP- or if she wants to send in the paperwork for time off work to [employer], she can do that and when they get back to me I will fill out my portion.   If she just needs a work note for one or 2 days we can do that for her.  Thanks-JLL

## 2019-09-09 NOTE — Procedures (Signed)
PROCEDURE SUMMARY:  Successful image-guided paracentesis from the left lower abdomen.  Yielded 9.1 liters of clear yellow fluid.  No immediate complications.  EBL: zero Patient tolerated well.   Specimen was not sent for labs.  Please see imaging section of Epic for full dictation.  Joaquim Nam PA-C 09/09/2019 10:43 AM

## 2019-09-09 NOTE — Telephone Encounter (Signed)
Spoke to patient to get more details on her request for a work note for multiple absences from work. She states today she didn't work because of her paracentesis, that they took off 9 liters and she felt bad after. States she works from home, on the phone and the computer. States the other days she has not been able to work because she had decreased cognitive status due to her labs being out of normal range. I told her this may be something to address with her PCP, but I would sent it to [facility] and see what she recommends.

## 2019-09-09 NOTE — Telephone Encounter (Signed)
Called patient back, left message to please call back again

## 2019-09-10 ENCOUNTER — Ambulatory Visit: Payer: Medicaid Other | Admitting: Gastroenterology

## 2019-09-11 ENCOUNTER — Encounter: Payer: Self-pay | Admitting: Gastroenterology

## 2019-09-11 ENCOUNTER — Other Ambulatory Visit (INDEPENDENT_AMBULATORY_CARE_PROVIDER_SITE_OTHER): Payer: Medicaid Other

## 2019-09-11 ENCOUNTER — Telehealth: Payer: Self-pay

## 2019-09-11 ENCOUNTER — Other Ambulatory Visit: Payer: Self-pay | Admitting: Emergency Medicine

## 2019-09-11 ENCOUNTER — Ambulatory Visit (INDEPENDENT_AMBULATORY_CARE_PROVIDER_SITE_OTHER): Payer: Medicaid Other | Admitting: Gastroenterology

## 2019-09-11 ENCOUNTER — Other Ambulatory Visit: Payer: Self-pay

## 2019-09-11 DIAGNOSIS — E876 Hypokalemia: Secondary | ICD-10-CM | POA: Diagnosis not present

## 2019-09-11 DIAGNOSIS — E878 Other disorders of electrolyte and fluid balance, not elsewhere classified: Secondary | ICD-10-CM

## 2019-09-11 DIAGNOSIS — K7031 Alcoholic cirrhosis of liver with ascites: Secondary | ICD-10-CM

## 2019-09-11 DIAGNOSIS — E871 Hypo-osmolality and hyponatremia: Secondary | ICD-10-CM

## 2019-09-11 LAB — CBC WITH DIFFERENTIAL/PLATELET
Basophils Absolute: 0.1 10*3/uL (ref 0.0–0.1)
Basophils Relative: 1.5 % (ref 0.0–3.0)
Eosinophils Absolute: 0.1 10*3/uL (ref 0.0–0.7)
Eosinophils Relative: 1 % (ref 0.0–5.0)
HCT: 34.1 % — ABNORMAL LOW (ref 36.0–46.0)
Hemoglobin: 11.2 g/dL — ABNORMAL LOW (ref 12.0–15.0)
Lymphocytes Relative: 21.6 % (ref 12.0–46.0)
Lymphs Abs: 1.5 10*3/uL (ref 0.7–4.0)
MCHC: 32.7 g/dL (ref 30.0–36.0)
MCV: 84.7 fl (ref 78.0–100.0)
Monocytes Absolute: 0.8 10*3/uL (ref 0.1–1.0)
Monocytes Relative: 11.4 % (ref 3.0–12.0)
Neutro Abs: 4.6 10*3/uL (ref 1.4–7.7)
Neutrophils Relative %: 64.5 % (ref 43.0–77.0)
Platelets: 225 10*3/uL (ref 150.0–400.0)
RBC: 4.03 Mil/uL (ref 3.87–5.11)
RDW: 21.7 % — ABNORMAL HIGH (ref 11.5–15.5)
WBC: 7.1 10*3/uL (ref 4.0–10.5)

## 2019-09-11 LAB — COMPREHENSIVE METABOLIC PANEL
ALT: 25 U/L (ref 0–35)
AST: 60 U/L — ABNORMAL HIGH (ref 0–37)
Albumin: 3 g/dL — ABNORMAL LOW (ref 3.5–5.2)
Alkaline Phosphatase: 152 U/L — ABNORMAL HIGH (ref 39–117)
BUN: 17 mg/dL (ref 6–23)
CO2: 25 mEq/L (ref 19–32)
Calcium: 8.8 mg/dL (ref 8.4–10.5)
Chloride: 90 mEq/L — ABNORMAL LOW (ref 96–112)
Creatinine, Ser: 1.55 mg/dL — ABNORMAL HIGH (ref 0.40–1.20)
GFR: 35.94 mL/min — ABNORMAL LOW (ref >60.00)
Glucose, Bld: 120 mg/dL — ABNORMAL HIGH (ref 70–99)
Potassium: 3.7 mEq/L (ref 3.5–5.1)
Sodium: 125 mEq/L — ABNORMAL LOW (ref 135–145)
Total Bilirubin: 2.4 mg/dL — ABNORMAL HIGH (ref 0.2–1.2)
Total Protein: 7.4 g/dL (ref 6.0–8.3)

## 2019-09-11 MED ORDER — LACTULOSE 20 GM/30ML PO SOLN: 20.0000 g | Freq: Three times a day (TID) | ORAL | 0 refills | Status: DC

## 2019-09-11 NOTE — Telephone Encounter (Signed)
Lab called requesting a nurse to come and assess this patient. Patient was in a wheelchair and had another person with her. Stated she felt SOB and just didn't feel well. She looked sleepy. Lung sounds were clear Bilaterally, anterior and posterior. HR= 120 and regular. I asked patient if she is taking medication for anxiety and she stated yes, that she had taken some but it had not had time to kick-in. Stated she has not been able to keep anything down, or has had diarrhea with everything except water and orange juice. I suggested she try some gator-aid to replace some electrolytes. She had an office visit today with Dr. Tarri Glenn and is getting labs that Dr. Tarri Glenn ordered.

## 2019-09-11 NOTE — Progress Notes (Addendum)
TELEHEALTH VISIT  Referring Provider: Azzie Glatter, FNP Primary Care Physician:  Azzie Glatter, FNP  Tele-visit due to COVID-19 pandemic Patient requested visit virtually, consented to the virtual encounter via video enabled telemedicine application using [employer] Contact made at: 10:00 09/11/19 Patient verified by name and date of birth Location of patient: Home Location provider: [facility] Names of persons participating: Me, patient, [name] CMA Time spent on telehealth visit: 55 minutes I discussed the limitations of evaluation and management by telemedicine. The patient expressed understanding and agreed to proceed.  Chief complaint:  Ascites   IMPRESSION:  Recent progressive hyponatremia Hypokalemia with progressive renal insufficiency    -Received potassium chloride supplementation 09/01/2022 serum sodium of 2.8 Decompensated cirrhosis with a meld score of 11 based on 04/28/2019 labs Recurrent ascites despite serial LVP's     - recent LVP without fluids or albumin Grade 2 esophageal varices on EGD 04/28/2019 without history of bleeding Patient reported cognitive impairment Low vitamin D levels Iron deficiency without anemia  I am very concerned about Kara Torres.  Her labs last week suggested progressive hyponatremia and renal insufficiency.  I am concerned that her 9 L LVP 48 hours ago without IV fluids or IV albumin may have exacerbated her volume depletion and renal dysfunction.  My conversation with her makes it unclear if she would be able to come in for labs.  I frankly stated my concerns and have asked her to seek care in emergency room as I suspect she will need to be admitted.  If she is having cognitive impairment she may not be making good decisions over time.  I recommended that she seek care earlier rather than wait.  Labs today to follow-up on her recent hypokalemia.  This is likely related to her diuretics.  Her ascites has been  difficult to manage.  We reviewed a 2000 mg sodium restricted diet.  She is trying to limit her fluids to 2 L daily.  I am afraid her dose of Aldactone and furosemide will need to be adjusted given the trajectory of her renal dysfunction and recent large volume paracentesis without concurrent albumin infusion which has likely worsened renal insufficiency.  We will use labs to make this decision as she is going to have difficulty with recurrent ascites.  Doppler ultrasound recommended to exclude portal vein thrombus.  Progressive hyponatremia.  Need to continue strict dietary sodium and fluid restrictions.  Found to have nonbleeding esophageal varices on a screening EGD 04/28/2019.  Ideally she needs a nonselective beta-blocker.  However with her recent progressive renal insufficiency I am not recommending treatment at this time.  If she is unable to start a nonselective beta-blocker in the near future may want to consider serial EGDs with band ligation until the varices are obliterated.  Suspected early hepatic encephalopathy.  We will add lactulose although is unclear how helpful this will be given her baseline bowel habit frequency.  She is unable to afford Xifaxan.  We will see if we can obtain samples for her.  The patient has applied for disability.  I have encouraged her to seek FMLA.  Although her recent labs show a meld score of only 11, given her decompensation evaluation for liver transplant is appropriate.  We will arrange referral to a local transplant center when she has insurance available.   PLAN: CMP, CBC Continue 2g sodium restricted diet Lactulose 20g TID - titrated to have at least 3 bowel movements Hold Aldactone and lactulose  until labs today are available Doppler ultrasound to exclude portal vein thrombus Requesting a letter for the missed work this week (Wednesday-Saturday) I instructed her to go to the ED over the weekend because I don't have any lab results available to  me Follow-up appointment in 1 week with me or Kara Torres  I spent 55 minutes of time, including in depth chart review, independent review of results as outlined above, communicating results with the patient directly, face-to-face time with the patient, coordinating care, ordering studies and medications as appropriate, and documentation.  Please see the "Patient Instructions" section for addition details about the plan.  HPI: Kara Torres is a 49 y.o. female with decompensated cirrhosis.  This is my clinical encounter with the patient beyond her EGD and colonoscopy 04/28/2019.  However, she has been closely followed by Kara Torres in our office for recent progression in her decompensation.  Labs from 04/28/2019 show a meld score of 11.  She has had difficulty recently with management of ascites and has had several LVP's and was started on diuretics last month.  There is no history of SBP.  She has known portal hypertensive gastropathy, grade 2 esophageal varices without history of bleeding, and labs last week showing progressive hyponatremia.  There is no history of encephalopathy but the patient has concerns today about cognitive impairment.  She is having increasing difficulty with ascites.  She was started on spironolactone 50 mg daily and Lasix 40 mg daily 08/12/2019.  Follow-up labs were ordered.  However, the patient was unable to have the blood work performed because she either did not feel well enough to go or was unable to miss work out of fear of being fired.  Our office arranged for a paracentesis 08/20/2019.  ER arranged for paracentesis 09/09/19. They took off 9L. She was not given any IV or albumin.  Missed work since the paracentesis because she felt so bad.    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  She is concerned about termination due to missing work.  Having increasing difficulty concentrating at work and completing necessary tasks. Last Friday she could not  figure out the tracking number for a patient. Drove to [facility] after work on Friday to go out to dinner with her ex-husband. She was confused and got lost even though she has been there many times.   7.5 hours of sleep last night. Frequent afternoon naps.  No reversal in sleep-wake cycle. No falls. But she feels dizzy.  No driving infractions.  Weight is 135.  She is lost over 60 pounds since she was diagnosed with cirrhosis. Takes a laxative when she used iron in the past. Has not been tolerating iron pills.  Has been having diarrhea.  She does not feel lactulose will be helpful because she already has at least 3 bowel movements daily.  Urine was dark yellow.   Unable to tolerate food over the last 48 hours.   She has no insurance. She applied to Medicaid.  Overwhelmed by medical costs and concerned about losing her job.  Denies any alcohol. No beer, wine, liquor, or non-alcoholic beer.  Drank 32 ounces of water yesterday.  2000 mg sodium restricted diet But she complains that she isn't eating because she does not feel well.  No fevers chills or night sweats.  No abdominal pain.  Recent labs: Labs 04/28/2019: Sodium 137, potassium 3.4, creatinine 0.60, total bilirubin 1.3 Labs 05/28/2019: Iron 28, ferritin 20.9 Labs 06/17/2019: Vitamin D 12.3,  vitamin B12 1105, TSH 2.130 Labs 09/02/2019: Sodium 127, potassium 2.8, creatinine 1.14, albumin 2.7, total bilirubin 2.6, hemoglobin 9.6, platelets 166 Labs 09/03/2019: Sodium 131, potassium 3.2, creatinine 1.29, albumin 2.7, total bilirubin 2.5, white count 5.5, hemoglobin 10.3, platelets 169  Recent abdominal imaging: Abdominal ultrasound with elastography 08/20/2019: Cirrhosis with no liver mass, moderate splenomegaly, ascites, prior cholecystectomy Paracentesis 08/20/2019: 5 L removed Paracentesis 09/09/2019: 9.1 L removed   Past Medical History:  Diagnosis Date  . Allergy   . Alpha-1-antitrypsin deficiency ([place])   . Anxiety   . Asthma     . Cataracts, bilateral   . Depression   . Esophageal varices ([place])   . History of cholecystectomy   . Liver disease   . Vitamin D deficiency 06/2019    Past Surgical History:  Procedure Laterality Date  . CHOLECYSTECTOMY  2000  . IR PARACENTESIS  08/20/2019  . IR PARACENTESIS  09/09/2019    Current Outpatient Medications  Medication Sig Dispense Refill  . atorvastatin (LIPITOR) 40 MG tablet Take 1 tablet (40 mg total) by mouth daily. 30 tablet 3  . cetirizine (ZYRTEC) 10 MG tablet Take 1 tablet (10 mg total) by mouth daily. 30 tablet 11  . ferrous sulfate (FERROUSUL) 325 (65 FE) MG tablet Take 1 tablet (325 mg total) by mouth every other day. 60 tablet 2  . FLUoxetine (PROZAC) 20 MG tablet Take 1 tablet (20 mg total) by mouth daily. 30 tablet 2  . furosemide (LASIX) 40 MG tablet Take 1 tablet (40 mg total) by mouth daily. 30 tablet 5  . hydrOXYzine (ATARAX/VISTARIL) 50 MG tablet Take 50 mg by mouth 3 (three) times daily as needed for anxiety or itching.     Marland Kitchen ipratropium (ATROVENT) 0.03 % nasal spray Place 2 sprays into both nostrils 2 (two) times daily. (Patient not taking: Reported on 09/03/2019) 30 mL 3  . loperamide (IMODIUM A-D) 2 MG tablet Take 1 tablet (2 mg total) by mouth 4 (four) times daily as needed for diarrhea or loose stools. 30 tablet 1  . ondansetron (ZOFRAN) 4 MG tablet Take 1 tablet (4 mg total) by mouth every 8 (eight) hours as needed for nausea or vomiting. (Patient not taking: Reported on 09/03/2019) 20 tablet 2  . ondansetron (ZOFRAN) 8 MG tablet Take 8 mg by mouth every 6 (six) hours as needed for nausea or vomiting.     . pantoprazole (PROTONIX) 40 MG tablet Take 1 tablet (40 mg total) by mouth 2 (two) times daily before a meal. (Patient taking differently: Take 40 mg by mouth every evening. ) 180 tablet 3  . Potassium Chloride ER 20 MEQ TBCR Take 40 mEq by mouth 4 (four) times daily for 3 days. 24 tablet 0  . spironolactone (ALDACTONE) 50 MG tablet Take 1 tablet  (50 mg total) by mouth daily. 30 tablet 5  . Vitamin D, Ergocalciferol, (DRISDOL) 1.25 MG (50000 UT) CAPS capsule Take 1 capsule (50,000 Units total) by mouth every 7 (seven) days. 5 capsule 6   No current facility-administered medications for this visit.    Allergies as of 09/11/2019 - Review Complete 09/03/2019  Allergen Reaction Noted  . Epinephrine  12/25/2014  . Hydrocodone Rash 12/25/2014    Family History  Problem Relation Age of Onset  . Stroke Mother   . Hyperlipidemia Mother   . Diabetes Mother   . Diabetes Father   . Hypertension Father   . Hyperlipidemia Father   . Mental illness Father   . Stroke  Father   . Diabetes Maternal Grandmother   . Heart disease Maternal Grandmother   . Hypertension Maternal Grandmother   . Heart attack Maternal Grandmother   . Colon cancer Other        multiple great aunts, great uncles  . Esophageal cancer Neg Hx   . Stomach cancer Neg Hx     Social History   Socioeconomic History  . Marital status: Divorced    Spouse name: Not on file  . Number of children: Not on file  . Years of education: Not on file  . Highest education level: Not on file  Occupational History  . Occupation: Therapist, art  Tobacco Use  . Smoking status: Former Research scientist (life sciences)  . Smokeless tobacco: Never Used  Substance and Sexual Activity  . Alcohol use: Yes    Alcohol/week: 2.0 standard drinks    Types: 2 Cans of beer per week    Comment: no longer uses alcohol-09-03-2019  . Drug use: No  . Sexual activity: Not on file  Other Topics Concern  . Not on file  Social History Narrative  . Not on file   Social Determinants of Health   Financial Resource Strain:   . Difficulty of Paying Living Expenses: Not on file  Food Insecurity:   . Worried About Charity fundraiser in the Last Year: Not on file  . Ran Out of Food in the Last Year: Not on file  Transportation Needs:   . Lack of Transportation (Medical): Not on file  . Lack of Transportation  (Non-Medical): Not on file  Physical Activity:   . Days of Exercise per Week: Not on file  . Minutes of Exercise per Session: Not on file  Stress:   . Feeling of Stress : Not on file  Social Connections:   . Frequency of Communication with Friends and Family: Not on file  . Frequency of Social Gatherings with Friends and Family: Not on file  . Attends Religious Services: Not on file  . Active Member of Clubs or Organizations: Not on file  . Attends Archivist Meetings: Not on file  . Marital Status: Not on file  Intimate Partner Violence:   . Fear of Current or Ex-Partner: Not on file  . Emotionally Abused: Not on file  . Physically Abused: Not on file  . Sexually Abused: Not on file     Physical Exam: Complete physical exam not performed due to the limits inherent in a telehealth encounter.  General: Awake, alert, and oriented, and well communicative. In no acute distress.  Pulm: No labored breathing, speaking in full sentences without conversational dyspnea  Psych: Pleasant, cooperative, normal speech, normal affect and normal insight Neuro: Alert and appropriate      Studies/Results: IR Paracentesis  Result Date: 09/09/2019 INDICATION: Patient with history of alcoholic cirrhosis, recurrent ascites. Request to IR for therapeutic paracentesis. EXAM: ULTRASOUND GUIDED THERAPEUTIC PARACENTESIS MEDICATIONS: 10 mL 1% lidocaine COMPLICATIONS: None immediate. PROCEDURE: Informed written consent was obtained from the patient after a discussion of the risks, benefits and alternatives to treatment. A timeout was performed prior to the initiation of the procedure. Initial ultrasound scanning demonstrates a large amount of ascites within the left lower abdominal quadrant. The left lower abdomen was prepped and draped in the usual sterile fashion. 1% lidocaine was used for local anesthesia. Following this, a 19 gauge, 7-cm, Yueh catheter was introduced. An ultrasound image was saved  for documentation purposes. The paracentesis was performed. The catheter was removed  and a dressing was applied. The patient tolerated the procedure well without immediate post procedural complication. FINDINGS: A total of approximately 9.1 L of clear yellow fluid was removed. IMPRESSION: Successful ultrasound-guided paracentesis yielding 9.1 liters of peritoneal fluid. Read by Candiss Norse, PA-C Electronically Signed   By: Lucrezia Europe M.D.   On: 09/09/2019 11:41      Sherwood Castilla L. Tarri Glenn, MD, MPH 09/11/2019, 9:49 AM

## 2019-09-11 NOTE — Patient Instructions (Addendum)
Mrs. Brees, I am very concerned about you.  I think you need to go to an emergency room to be evaluated.  We discussed the possibility of having labs today to to guide our decision making.  However with your work schedule it sounded like this may not happen.  And I am concerned that your liver is affecting both your kidney function, your ability to think clearly, and your sodium levels.  If you are able to go today for labs,  lab is located at the first door on the left as you exit the elevator.  I will contact you with the results when they are available.  Continue 2 gram sodium restricted diet.  Continue a 2 L fluid restricted diet.  We have sent the following medications to your pharmacy for you to pick up at your convenience: Lactulose 20 gram three times a day, titrate to have at least 3 bowel movements This medication is recommended to help you think more clearly.  There is another medicine, Xifaxan, that we can consider if the lactulose is not helping.

## 2019-09-11 NOTE — Telephone Encounter (Signed)
Work excuse letter sent to My Chart, per patient request

## 2019-09-11 NOTE — Progress Notes (Signed)
Reviewed and agree with management plans. ? ?Nyari Olsson L. Abubakr Wieman, MD, MPH  ?

## 2019-09-11 NOTE — Telephone Encounter (Signed)
Thank you :)

## 2019-09-11 NOTE — Telephone Encounter (Signed)
She needs to go to the ED for further evaluation and admission. She does not sound safe to be at home.

## 2019-09-11 NOTE — Telephone Encounter (Signed)
The pt has been advised to go to the ED for evaluation and admission.  She tells me that she is going to the hospital that is closest to her home.  She was unsure of the name of the facility.

## 2019-09-14 ENCOUNTER — Telehealth: Payer: Self-pay | Admitting: Gastroenterology

## 2019-09-14 ENCOUNTER — Other Ambulatory Visit (INDEPENDENT_AMBULATORY_CARE_PROVIDER_SITE_OTHER): Payer: Medicaid Other

## 2019-09-14 DIAGNOSIS — E878 Other disorders of electrolyte and fluid balance, not elsewhere classified: Secondary | ICD-10-CM

## 2019-09-14 DIAGNOSIS — E871 Hypo-osmolality and hyponatremia: Secondary | ICD-10-CM | POA: Diagnosis not present

## 2019-09-14 DIAGNOSIS — K7031 Alcoholic cirrhosis of liver with ascites: Secondary | ICD-10-CM

## 2019-09-14 LAB — BASIC METABOLIC PANEL
BUN: 18 mg/dL (ref 6–23)
CO2: 28 mEq/L (ref 19–32)
Calcium: 8.9 mg/dL (ref 8.4–10.5)
Chloride: 91 mEq/L — ABNORMAL LOW (ref 96–112)
Creatinine, Ser: 1.54 mg/dL — ABNORMAL HIGH (ref 0.40–1.20)
GFR: 36.21 mL/min — ABNORMAL LOW (ref >60.00)
Glucose, Bld: 124 mg/dL — ABNORMAL HIGH (ref 70–99)
Potassium: 3.8 mEq/L (ref 3.5–5.1)
Sodium: 126 mEq/L — ABNORMAL LOW (ref 135–145)

## 2019-09-14 NOTE — Telephone Encounter (Signed)
Left message on machine to call back  

## 2019-09-14 NOTE — Progress Notes (Signed)
Left message on machine to call back  

## 2019-09-14 NOTE — Progress Notes (Signed)
Please call the patient to see how she is doing.  On Friday she was supposed to go to the emergency room.  Thank you.

## 2019-09-15 ENCOUNTER — Other Ambulatory Visit: Payer: Self-pay | Admitting: *Deleted

## 2019-09-15 ENCOUNTER — Encounter: Payer: Self-pay | Admitting: Family Medicine

## 2019-09-15 ENCOUNTER — Other Ambulatory Visit: Payer: Self-pay

## 2019-09-15 ENCOUNTER — Ambulatory Visit (INDEPENDENT_AMBULATORY_CARE_PROVIDER_SITE_OTHER): Payer: Medicaid Other | Admitting: Family Medicine

## 2019-09-15 VITALS — BP 109/76 | HR 117 | Temp 97.8°F | Ht 64.0 in | Wt 141.2 lb

## 2019-09-15 DIAGNOSIS — R7989 Other specified abnormal findings of blood chemistry: Secondary | ICD-10-CM

## 2019-09-15 DIAGNOSIS — K7031 Alcoholic cirrhosis of liver with ascites: Secondary | ICD-10-CM

## 2019-09-15 DIAGNOSIS — K7011 Alcoholic hepatitis with ascites: Secondary | ICD-10-CM | POA: Diagnosis not present

## 2019-09-15 DIAGNOSIS — E559 Vitamin D deficiency, unspecified: Secondary | ICD-10-CM

## 2019-09-15 DIAGNOSIS — K7030 Alcoholic cirrhosis of liver without ascites: Secondary | ICD-10-CM | POA: Diagnosis not present

## 2019-09-15 DIAGNOSIS — I10 Essential (primary) hypertension: Secondary | ICD-10-CM

## 2019-09-15 DIAGNOSIS — R8290 Unspecified abnormal findings in urine: Secondary | ICD-10-CM

## 2019-09-15 DIAGNOSIS — I8510 Secondary esophageal varices without bleeding: Secondary | ICD-10-CM

## 2019-09-15 DIAGNOSIS — H539 Unspecified visual disturbance: Secondary | ICD-10-CM

## 2019-09-15 DIAGNOSIS — F1011 Alcohol abuse, in remission: Secondary | ICD-10-CM | POA: Diagnosis not present

## 2019-09-15 DIAGNOSIS — Z0000 Encounter for general adult medical examination without abnormal findings: Secondary | ICD-10-CM | POA: Diagnosis not present

## 2019-09-15 DIAGNOSIS — K5900 Constipation, unspecified: Secondary | ICD-10-CM

## 2019-09-15 DIAGNOSIS — E871 Hypo-osmolality and hyponatremia: Secondary | ICD-10-CM

## 2019-09-15 DIAGNOSIS — H269 Unspecified cataract: Secondary | ICD-10-CM

## 2019-09-15 DIAGNOSIS — Z09 Encounter for follow-up examination after completed treatment for conditions other than malignant neoplasm: Secondary | ICD-10-CM

## 2019-09-15 DIAGNOSIS — R9431 Abnormal electrocardiogram [ECG] [EKG]: Secondary | ICD-10-CM

## 2019-09-15 DIAGNOSIS — R002 Palpitations: Secondary | ICD-10-CM

## 2019-09-15 DIAGNOSIS — E876 Hypokalemia: Secondary | ICD-10-CM

## 2019-09-15 LAB — POCT URINALYSIS DIPSTICK
Glucose, UA: NEGATIVE
Leukocytes, UA: NEGATIVE
Nitrite, UA: NEGATIVE
Protein, UA: POSITIVE — AB
Spec Grav, UA: 1.02 (ref 1.010–1.025)
Urobilinogen, UA: 1 E.U./dL
pH, UA: 5 (ref 5.0–8.0)

## 2019-09-15 LAB — POCT GLYCOSYLATED HEMOGLOBIN (HGB A1C): Hemoglobin A1C: 4.3 % (ref 4.0–5.6)

## 2019-09-15 LAB — GLUCOSE, POCT (MANUAL RESULT ENTRY): POC Glucose: 117 mg/dl — AB (ref 70–99)

## 2019-09-15 MED ORDER — VITAMIN D (ERGOCALCIFEROL) 1.25 MG (50000 UNIT) PO CAPS: 50000.0000 [IU] | ORAL_CAPSULE | ORAL | 6 refills | Status: AC

## 2019-09-15 NOTE — Progress Notes (Signed)
Patient [name] Follow Up   Subjective:  Patient ID: Kara Torres, female    DOB: [birth year]  Age: 49 y.o. MRN: [id number]  CC:  Chief Complaint  Patient presents with  . Follow-up    HTN  . Hospitalization Follow-up    Dx Weakness on 09/11/2019    HPI Kara Torres is a 49 year old female who presents for Hospital Follow Up today.   Past Medical History:  Diagnosis Date  . Allergy   . Alpha-1-antitrypsin deficiency ([place])   . Anxiety   . Ascites due to alcoholic cirrhosis ([place])   . Asthma   . Cataracts, bilateral   . Depression   . Elevated serum creatinine 08/2019  . Esophageal varices ([place])   . History of cholecystectomy   . Hypocalcemia 08/2019  . Hyponatremia 08/2019  . Liver disease   . Vitamin D deficiency 06/2019    Patient Active Problem List   Diagnosis Date Noted  . Cataract of both eyes 06/19/2019  . Chronic gastritis without bleeding 06/19/2019  . 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 Alcohol abuse 06/19/2019  . Visual disturbance 12/10/2018  . Eye exam abnormal 12/10/2018  . Nausea and vomiting 10/17/2018  . Abdominal pain, bilateral upper quadrant 10/17/2018  . Diarrhea 10/17/2018  . Chronic seasonal allergic rhinitis due to pollen 10/17/2018  . Daytime sleepiness 04/28/2017  . Essential hypertension 07/13/2016  . Obesity 07/13/2016  . Dyslipidemia 07/13/2016  . Fatty liver disease, nonalcoholic 0000000  . Generalized anxiety disorder 07/13/2016   Current Status: Since hr last office visit, she has c/o heart palpitations and presented abnormal ECG on 09/03/2019. She denies visual changes, chest pain, cough, shortness of breath, heart palpitations, and falls. She continues to follow up with GI frequently with Dr. Tarri Glenn. She has had 2 paracentesis since her last visit. Last removed 9.5 liters of fluid was removed on 09/11/2019. Previous paracentesis 5.5  liters were removed on 08/24/2019. GI is currently monitoring her Creatinine Levels. She has occasional headaches and dizziness with position changes. Denies severe headaches, confusion, seizures, double vision, and blurred vision, nausea and vomiting. Not drinking alcoholic beverages since [id number]. Her anxiety is sta today. She denies suicidal ideations, homicidal ideations, or auditory hallucinations. She continues to follow up with Psychiatry as needed. She denies fevers, chills, fatigue, or fevers. No reports of GI problems such as diarrhea, and constipation. She has no reports of blood in stools, dysuria and hematuria. She denies pain today.   Past Surgical History:  Procedure Laterality Date  . CHOLECYSTECTOMY  2000  . IR PARACENTESIS  08/20/2019  . IR PARACENTESIS  09/09/2019      Family History  Problem Relation Age of Onset  . Stroke Mother   . Hyperlipidemia Mother   . Diabetes Mother   . Diabetes Father   . Hypertension Father   . Hyperlipidemia Father   . Mental illness Father   . Stroke Father   . Diabetes Maternal Grandmother   . Heart disease Maternal Grandmother   . Hypertension Maternal Grandmother   . Heart attack Maternal Grandmother   . Colon cancer Other        multiple great aunts, great uncles  . Esophageal cancer Neg Hx   . Stomach cancer Neg Hx     Social History   Socioeconomic History  . Marital status: Divorced    Spouse name: Not on file  . Number of  children: Not on file  . Years of education: Not on file  . Highest education level: Not on file  Occupational History  . Occupation: Therapist, art  Tobacco Use  . Smoking status: Former Research scientist (life sciences)  . Smokeless tobacco: Never Used  Substance and Sexual Activity  . Alcohol use: Yes    Alcohol/week: 2.0 standard drinks    Types: 2 Cans of beer per week    Comment: no longer uses alcohol-09-03-2019  . Drug use: No  . Sexual activity: Not Currently  Other Topics Concern  . Not on file  Social  History Narrative  . Not on file   Social Determinants of Health   Financial Resource Strain:   . Difficulty of Paying Living Expenses: Not on file  Food Insecurity:   . Worried About Charity fundraiser in the Last Year: Not on file  . Ran Out of Food in the Last Year: Not on file  Transportation Needs:   . Lack of Transportation (Medical): Not on file  . Lack of Transportation (Non-Medical): Not on file  Physical Activity:   . Days of Exercise per Week: Not on file  . Minutes of Exercise per Session: Not on file  Stress:   . Feeling of Stress : Not on file  Social Connections:   . Frequency of Communication with Friends and Family: Not on file  . Frequency of Social Gatherings with Friends and Family: Not on file  . Attends Religious Services: Not on file  . Active Member of Clubs or Organizations: Not on file  . Attends Archivist Meetings: Not on file  . Marital Status: Not on file  Intimate Partner Violence:   . Fear of Current or Ex-Partner: Not on file  . Emotionally Abused: Not on file  . Physically Abused: Not on file  . Sexually Abused: Not on file    Outpatient Medications Prior to Visit  Medication Sig Dispense Refill  . atorvastatin (LIPITOR) 40 MG tablet Take 1 tablet (40 mg total) by mouth daily. 30 tablet 3  . cetirizine (ZYRTEC) 10 MG tablet Take 1 tablet (10 mg total) by mouth daily. 30 tablet 11  . ferrous sulfate (FERROUSUL) 325 (65 FE) MG tablet Take 1 tablet (325 mg total) by mouth every other day. 60 tablet 2  . hydrOXYzine (ATARAX/VISTARIL) 50 MG tablet Take 50 mg by mouth 3 (three) times daily as needed for anxiety or itching.     . loperamide (IMODIUM A-D) 2 MG tablet Take 1 tablet (2 mg total) by mouth 4 (four) times daily as needed for diarrhea or loose stools. 30 tablet 1  . ondansetron (ZOFRAN) 4 MG tablet Take 1 tablet (4 mg total) by mouth every 8 (eight) hours as needed for nausea or vomiting. 20 tablet 2  . ondansetron (ZOFRAN) 8 MG  tablet Take 8 mg by mouth every 6 (six) hours as needed for nausea or vomiting.     . pantoprazole (PROTONIX) 40 MG tablet Take 1 tablet (40 mg total) by mouth 2 (two) times daily before a meal. (Patient taking differently: Take 40 mg by mouth every evening. ) 180 tablet 3  . ipratropium (ATROVENT) 0.03 % nasal spray Place 2 sprays into both nostrils 2 (two) times daily. 30 mL 3  . Vitamin D, Ergocalciferol, (DRISDOL) 1.25 MG (50000 UT) CAPS capsule Take 1 capsule (50,000 Units total) by mouth every 7 (seven) days. 5 capsule 6  . FLUoxetine (PROZAC) 20 MG tablet Take 1 tablet (20  mg total) by mouth daily. 30 tablet 2  . Lactulose 20 GM/30ML SOLN Take 30 mLs (20 g total) by mouth 3 (three) times daily. (Patient not taking: Reported on 09/15/2019) 450 mL 0  . furosemide (LASIX) 40 MG tablet Take 1 tablet (40 mg total) by mouth daily. 30 tablet 5  . Potassium Chloride ER 20 MEQ TBCR Take 40 mEq by mouth 4 (four) times daily for 3 days. 24 tablet 0  . spironolactone (ALDACTONE) 50 MG tablet Take 1 tablet (50 mg total) by mouth daily. (Patient not taking: Reported on 09/15/2019) 30 tablet 5   No facility-administered medications prior to visit.    Allergies  Allergen Reactions  . Epinephrine   . Hydrocodone Rash    ROS Review of Systems  Constitutional: Negative.   HENT: Negative.   Eyes: Positive for visual disturbance (bilateral cataracts).  Respiratory: Negative.   Cardiovascular: Positive for palpitations (frequent).  Gastrointestinal: Positive for abdominal distention (acites, liver cirrhosis).  Endocrine: Negative.   Genitourinary: Negative.   Musculoskeletal: Negative.   Skin: Negative.   Allergic/Immunologic: Negative.   Neurological: Positive for dizziness (Occasional ) and headaches (occasional ).  Hematological: Negative.   Psychiatric/Behavioral: Negative.       Objective:    Physical Exam  Constitutional: She is oriented to person, place, and time. She appears  well-developed and well-nourished.  HENT:  Head: Normocephalic and atraumatic.  Eyes: Conjunctivae are normal.  Cardiovascular: Normal rate, regular rhythm, normal heart sounds and intact distal pulses.  Pulmonary/Chest: Effort normal and breath sounds normal.  Abdominal: Soft. Bowel sounds are normal.  Musculoskeletal:        General: Normal range of motion.     Cervical back: Normal range of motion and neck supple.  Neurological: She is alert and oriented to person, place, and time. She has normal reflexes.  Skin: Skin is warm and dry.  Psychiatric: She has a normal mood and affect. Her behavior is normal. Judgment and thought content normal.  Nursing note and vitals reviewed.   BP 109/76   Pulse (!) 117   Temp 97.8 F (36.6 C) (Oral)   Ht 5\' 4"  (1.626 m)   Wt 141 lb 3.2 oz (64 kg)   SpO2 100%   BMI 24.24 kg/m  Wt Readings from Last 3 Encounters:  09/15/19 141 lb 3.2 oz (64 kg)  09/03/19 156 lb (70.8 kg)  09/03/19 156 lb 6.4 oz (70.9 kg)   Health Maintenance Due  Topic Date Due  . HIV Screening  05/12/1988  . PAP SMEAR-Modifier  03/17/2019    There are no preventive care reminders to display for this patient.  Lab Results  Component Value Date   TSH 2.130 06/17/2019   Lab Results  Component Value Date   WBC 7.9 09/17/2019   HGB 10.6 (L) 09/17/2019   HCT 32.2 (L) 09/17/2019   MCV 87.2 09/17/2019   PLT 252.0 09/17/2019   Lab Results  Component Value Date   NA 125 (L) 09/17/2019   K 3.3 (L) 09/17/2019   CO2 25 09/17/2019   GLUCOSE 89 09/17/2019   BUN 18 09/17/2019   CREATININE 1.28 (H) 09/17/2019   BILITOT 2.4 (H) 09/11/2019   ALKPHOS 152 (H) 09/11/2019   AST 60 (H) 09/11/2019   ALT 25 09/11/2019   PROT 7.4 09/11/2019   ALBUMIN 3.0 (L) 09/11/2019   CALCIUM 8.6 09/17/2019   ANIONGAP 11 09/03/2019   GFR 44.82 (L) 09/17/2019   Lab Results  Component Value Date  CHOL 126 06/17/2019   Lab Results  Component Value Date   HDL 34 (L) 06/17/2019    Lab Results  Component Value Date   LDLCALC 77 06/17/2019   Lab Results  Component Value Date   TRIG 76 06/17/2019   Lab Results  Component Value Date   CHOLHDL 3.7 06/17/2019   Lab Results  Component Value Date   HGBA1C 4.3 09/15/2019   Assessment & Plan:   1. 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   2. Ascites due to alcoholic cirrhosis ([place]) She continues to follow up with GI for frequent monitoring and follow ups.   3. Ascites due to alcoholic hepatitis  4. History of alcohol abuse  5. Essential hypertension The current medical regimen is effective; blood pressure is stable at 109/76 today; continue present plan and medications as prescribed. She will continue to take medications as prescribed, to decrease high sodium intake, excessive alcohol intake, increase potassium intake, smoking cessation, and increase physical activity of at least 30 minutes of cardio activity daily. She will continue to follow Heart Healthy or DASH diet.  6. Heart palpitations Intermittent.  - Ambulatory referral to Cardiology  7. Hypokalemia  8. Abnormal EKG - Ambulatory referral to Cardiology  9. Constipation, unspecified constipation type  10. Visual disturbance  11. Cataract of both eyes, unspecified cataract type  12. Vitamin D deficiency - Vitamin D, Ergocalciferol, (DRISDOL) 1.25 MG (50000 UNIT) CAPS capsule; Take 1 capsule (50,000 Units total) by mouth every 7 (seven) days.  Dispense: 5 capsule; Refill: 6  13. Elevated serum creatinine - Ambulatory referral to Nephrology  14. Abnormal urinalysis Results are pending.  - Urine Culture  15. Healthcare maintenance - POCT glycosylated hemoglobin (Hb A1C) - POCT urinalysis dipstick - POCT glucose (manual entry)  16. Follow up She will follow up in 3 months.   Meds ordered this encounter  Medications  . Vitamin D, Ergocalciferol, (DRISDOL) 1.25 MG (50000 UNIT) CAPS capsule     Sig: Take 1 capsule (50,000 Units total) by mouth every 7 (seven) days.    Dispense:  5 capsule    Refill:  6    Orders Placed This Encounter  Procedures  . Urine Culture  . Ambulatory referral to Nephrology  . Ambulatory referral to Cardiology  . POCT glycosylated hemoglobin (Hb A1C)  . POCT urinalysis dipstick  . 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[place]) - Primary   Essential hypertension     Other   Cataract of both eyes   Visual disturbance    Other Visit Diagnoses    Ascites due to alcoholic cirrhosis ([place])       Ascites due to alcoholic hepatitis       History of alcohol abuse       Heart palpitations       Relevant Orders   Ambulatory referral to Cardiology   Hypocalcemia       Abnormal EKG       Relevant Orders   Ambulatory referral to Cardiology   Constipation, unspecified constipation type  Vitamin D deficiency       Relevant Medications   Vitamin D, Ergocalciferol, (DRISDOL) 1.25 MG (50000 UNIT) CAPS capsule   Elevated serum creatinine       Relevant Orders   Ambulatory referral to Nephrology   Abnormal urinalysis       Relevant Orders   Urine Culture (Completed)   Healthcare maintenance       Relevant Orders   POCT glycosylated hemoglobin (Hb A1C) (Completed)   POCT urinalysis dipstick (Completed)   POCT glucose (manual entry) (Completed)   Follow up          Meds ordered this encounter  Medications  . Vitamin D, Ergocalciferol, (DRISDOL) 1.25 MG (50000 UNIT) CAPS capsule    Sig: Take 1 capsule (50,000 Units total) by mouth every 7 (seven)  days.    Dispense:  5 capsule    Refill:  6    Follow-up: Return in about 3 months (around 12/13/2019).    Azzie Glatter, FNP

## 2019-09-16 ENCOUNTER — Telehealth: Payer: Self-pay | Admitting: Gastroenterology

## 2019-09-16 ENCOUNTER — Ambulatory Visit: Payer: No Typology Code available for payment source | Admitting: Family Medicine

## 2019-09-17 ENCOUNTER — Encounter: Payer: Self-pay | Admitting: Family Medicine

## 2019-09-17 ENCOUNTER — Other Ambulatory Visit: Payer: Self-pay | Admitting: *Deleted

## 2019-09-17 ENCOUNTER — Other Ambulatory Visit (INDEPENDENT_AMBULATORY_CARE_PROVIDER_SITE_OTHER): Payer: Medicaid Other

## 2019-09-17 DIAGNOSIS — K7031 Alcoholic cirrhosis of liver with ascites: Secondary | ICD-10-CM

## 2019-09-17 DIAGNOSIS — E871 Hypo-osmolality and hyponatremia: Secondary | ICD-10-CM | POA: Diagnosis not present

## 2019-09-17 LAB — BASIC METABOLIC PANEL
BUN: 18 mg/dL (ref 6–23)
CO2: 25 mEq/L (ref 19–32)
Calcium: 8.6 mg/dL (ref 8.4–10.5)
Chloride: 90 mEq/L — ABNORMAL LOW (ref 96–112)
Creatinine, Ser: 1.28 mg/dL — ABNORMAL HIGH (ref 0.40–1.20)
GFR: 44.82 mL/min — ABNORMAL LOW (ref >60.00)
Glucose, Bld: 89 mg/dL (ref 70–99)
Potassium: 3.3 mEq/L — ABNORMAL LOW (ref 3.5–5.1)
Sodium: 125 mEq/L — ABNORMAL LOW (ref 135–145)

## 2019-09-17 LAB — CBC WITH DIFFERENTIAL/PLATELET
Basophils Absolute: 0.2 10*3/uL — ABNORMAL HIGH (ref 0.0–0.1)
Basophils Relative: 2.3 % (ref 0.0–3.0)
Eosinophils Absolute: 0.2 10*3/uL (ref 0.0–0.7)
Eosinophils Relative: 2.2 % (ref 0.0–5.0)
HCT: 32.2 % — ABNORMAL LOW (ref 36.0–46.0)
Hemoglobin: 10.6 g/dL — ABNORMAL LOW (ref 12.0–15.0)
Lymphocytes Relative: 25.4 % (ref 12.0–46.0)
Lymphs Abs: 2 10*3/uL (ref 0.7–4.0)
MCHC: 32.8 g/dL (ref 30.0–36.0)
MCV: 87.2 fl (ref 78.0–100.0)
Monocytes Absolute: 1.1 10*3/uL — ABNORMAL HIGH (ref 0.1–1.0)
Monocytes Relative: 13.4 % — ABNORMAL HIGH (ref 3.0–12.0)
Neutro Abs: 4.5 10*3/uL (ref 1.4–7.7)
Neutrophils Relative %: 56.7 % (ref 43.0–77.0)
Platelets: 252 10*3/uL (ref 150.0–400.0)
RBC: 3.69 Mil/uL — ABNORMAL LOW (ref 3.87–5.11)
RDW: 20.8 % — ABNORMAL HIGH (ref 11.5–15.5)
WBC: 7.9 10*3/uL (ref 4.0–10.5)

## 2019-09-17 LAB — URINE CULTURE

## 2019-09-17 NOTE — Telephone Encounter (Signed)
CBC added to labs for today.

## 2019-09-17 NOTE — Telephone Encounter (Signed)
Her kidneys are related to her cirrhosis. Juicing will not help in this situation. Please add a CBC on to her labs that were already scheduled. Thank you.

## 2019-09-17 NOTE — Telephone Encounter (Signed)
Left detailed message for the patient concerning Dr. Tarri Glenn recommendations.

## 2019-09-17 NOTE — Telephone Encounter (Signed)
Spoke to the patient listened to a friend who told her she could cure her kidney ailments and other health problems with juicing. She started on Tuesday drinking Clean Juice and eating extremely light. She reports the juices gave her intense diarrhea and abd cramping and vomiting. She reports her urine did change from tea colored and malodorous to clear with no odor but she remains concerned about the diarrhea she experienced. She stated the diarrhea and urgency associated with the juices was "worse than a laxative." She also stated she experienced rectal bleeding even though she did drink beet juice. "There is no doubt in my mind that it was bright red blood." She will be in today for labs. Did you wanted to order any more labs prior to her arrival? Please advise?

## 2019-09-18 ENCOUNTER — Other Ambulatory Visit: Payer: Self-pay | Admitting: *Deleted

## 2019-09-18 DIAGNOSIS — E878 Other disorders of electrolyte and fluid balance, not elsewhere classified: Secondary | ICD-10-CM

## 2019-09-18 MED ORDER — SPIRONOLACTONE 25 MG PO TABS: 25.0000 mg | ORAL_TABLET | Freq: Every day | ORAL | 1 refills | Status: DC

## 2019-09-21 ENCOUNTER — Telehealth: Payer: Self-pay | Admitting: Family Medicine

## 2019-09-21 NOTE — Telephone Encounter (Signed)
Called and spoke with patient. She states that she vomited this morning and it did have blood in it. 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 Please advise. Also she has applied for medicaid she wanted to let you know. Thanks!

## 2019-09-21 NOTE — Telephone Encounter (Signed)
Please call pt back. Wanted to speak to Kara Torres. Reason not specified.

## 2019-09-22 ENCOUNTER — Other Ambulatory Visit (INDEPENDENT_AMBULATORY_CARE_PROVIDER_SITE_OTHER): Payer: Medicaid Other

## 2019-09-22 DIAGNOSIS — E878 Other disorders of electrolyte and fluid balance, not elsewhere classified: Secondary | ICD-10-CM | POA: Diagnosis not present

## 2019-09-22 LAB — BASIC METABOLIC PANEL
BUN: 18 mg/dL (ref 6–23)
CO2: 26 mEq/L (ref 19–32)
Calcium: 8.6 mg/dL (ref 8.4–10.5)
Chloride: 89 mEq/L — ABNORMAL LOW (ref 96–112)
Creatinine, Ser: 1.2 mg/dL (ref 0.40–1.20)
GFR: 48.29 mL/min — ABNORMAL LOW (ref >60.00)
Glucose, Bld: 94 mg/dL (ref 70–99)
Potassium: 3.6 mEq/L (ref 3.5–5.1)
Sodium: 122 mEq/L — ABNORMAL LOW (ref 135–145)

## 2019-09-23 ENCOUNTER — Encounter: Payer: Self-pay | Admitting: *Deleted

## 2019-09-23 ENCOUNTER — Encounter (HOSPITAL_COMMUNITY): Payer: Self-pay

## 2019-09-23 ENCOUNTER — Telehealth: Payer: Self-pay | Admitting: *Deleted

## 2019-09-23 ENCOUNTER — Observation Stay (HOSPITAL_COMMUNITY)
Admission: EM | Admit: 2019-09-23 | Discharge: 2019-09-24 | Discharge status: Against Medical Advice | Payer: Medicaid Other | Attending: Internal Medicine | Admitting: Internal Medicine

## 2019-09-23 ENCOUNTER — Other Ambulatory Visit: Payer: Self-pay

## 2019-09-23 DIAGNOSIS — J302 Other seasonal allergic rhinitis: Secondary | ICD-10-CM | POA: Diagnosis not present

## 2019-09-23 DIAGNOSIS — F329 Major depressive disorder, single episode, unspecified: Secondary | ICD-10-CM | POA: Insufficient documentation

## 2019-09-23 DIAGNOSIS — Z885 Allergy status to narcotic agent status: Secondary | ICD-10-CM | POA: Diagnosis not present

## 2019-09-23 DIAGNOSIS — K219 Gastro-esophageal reflux disease without esophagitis: Secondary | ICD-10-CM

## 2019-09-23 DIAGNOSIS — E669 Obesity, unspecified: Secondary | ICD-10-CM | POA: Diagnosis not present

## 2019-09-23 DIAGNOSIS — Z888 Allergy status to other drugs, medicaments and biological substances status: Secondary | ICD-10-CM | POA: Insufficient documentation

## 2019-09-23 DIAGNOSIS — F411 Generalized anxiety disorder: Secondary | ICD-10-CM | POA: Diagnosis not present

## 2019-09-23 DIAGNOSIS — Z87891 Personal history of nicotine dependence: Secondary | ICD-10-CM | POA: Diagnosis not present

## 2019-09-23 DIAGNOSIS — R002 Palpitations: Secondary | ICD-10-CM | POA: Insufficient documentation

## 2019-09-23 DIAGNOSIS — K7031 Alcoholic cirrhosis of liver with ascites: Secondary | ICD-10-CM

## 2019-09-23 DIAGNOSIS — K760 Fatty (change of) liver, not elsewhere classified: Secondary | ICD-10-CM | POA: Diagnosis not present

## 2019-09-23 DIAGNOSIS — F32A Depression, unspecified: Secondary | ICD-10-CM | POA: Diagnosis present

## 2019-09-23 DIAGNOSIS — F1010 Alcohol abuse, uncomplicated: Secondary | ICD-10-CM | POA: Diagnosis not present

## 2019-09-23 DIAGNOSIS — Z6824 Body mass index (BMI) 24.0-24.9, adult: Secondary | ICD-10-CM | POA: Insufficient documentation

## 2019-09-23 DIAGNOSIS — Z20822 Contact with and (suspected) exposure to covid-19: Secondary | ICD-10-CM | POA: Diagnosis not present

## 2019-09-23 DIAGNOSIS — E871 Hypo-osmolality and hyponatremia: Principal | ICD-10-CM

## 2019-09-23 DIAGNOSIS — Z79899 Other long term (current) drug therapy: Secondary | ICD-10-CM | POA: Insufficient documentation

## 2019-09-23 LAB — CBC WITH DIFFERENTIAL/PLATELET
Abs Immature Granulocytes: 0.02 10*3/uL (ref 0.00–0.07)
Basophils Absolute: 0 10*3/uL (ref 0.0–0.1)
Basophils Relative: 0 %
Eosinophils Absolute: 0.2 10*3/uL (ref 0.0–0.5)
Eosinophils Relative: 3 %
HCT: 30.6 % — ABNORMAL LOW (ref 36.0–46.0)
Hemoglobin: 10 g/dL — ABNORMAL LOW (ref 12.0–15.0)
Immature Granulocytes: 0 %
Lymphocytes Relative: 24 %
Lymphs Abs: 1.8 10*3/uL (ref 0.7–4.0)
MCH: 28.2 pg (ref 26.0–34.0)
MCHC: 32.7 g/dL (ref 30.0–36.0)
MCV: 86.4 fL (ref 80.0–100.0)
Monocytes Absolute: 1.1 10*3/uL — ABNORMAL HIGH (ref 0.1–1.0)
Monocytes Relative: 15 %
Neutro Abs: 4.3 10*3/uL (ref 1.7–7.7)
Neutrophils Relative %: 58 %
Platelets: 206 10*3/uL (ref 150–400)
RBC: 3.54 MIL/uL — ABNORMAL LOW (ref 3.87–5.11)
RDW: 18.6 % — ABNORMAL HIGH (ref 11.5–15.5)
WBC: 7.4 10*3/uL (ref 4.0–10.5)
nRBC: 0 % (ref 0.0–0.2)

## 2019-09-23 LAB — COMPREHENSIVE METABOLIC PANEL
ALT: 31 U/L (ref 0–44)
AST: 67 U/L — ABNORMAL HIGH (ref 15–41)
Albumin: 2.3 g/dL — ABNORMAL LOW (ref 3.5–5.0)
Alkaline Phosphatase: 135 U/L — ABNORMAL HIGH (ref 38–126)
Anion gap: 11 (ref 5–15)
BUN: 19 mg/dL (ref 6–20)
CO2: 22 mmol/L (ref 22–32)
Calcium: 8.1 mg/dL — ABNORMAL LOW (ref 8.9–10.3)
Chloride: 90 mmol/L — ABNORMAL LOW (ref 98–111)
Creatinine, Ser: 1.21 mg/dL — ABNORMAL HIGH (ref 0.44–1.00)
GFR calc Af Amer: >60 mL/min (ref >60)
GFR calc non Af Amer: 54 mL/min — ABNORMAL LOW (ref >60)
Glucose, Bld: 63 mg/dL — ABNORMAL LOW (ref 70–99)
Potassium: 3.7 mmol/L (ref 3.5–5.1)
Sodium: 123 mmol/L — ABNORMAL LOW (ref 135–145)
Total Bilirubin: 1.6 mg/dL — ABNORMAL HIGH (ref 0.3–1.2)
Total Protein: 6.7 g/dL (ref 6.5–8.1)

## 2019-09-23 LAB — AMMONIA: Ammonia: 15 umol/L (ref 9–35)

## 2019-09-23 LAB — LIPASE, BLOOD: Lipase: 34 U/L (ref 11–51)

## 2019-09-23 LAB — CBG MONITORING, ED
Glucose-Capillary: 67 mg/dL — ABNORMAL LOW (ref 70–99)
Glucose-Capillary: 116 mg/dL — ABNORMAL HIGH (ref 70–99)

## 2019-09-23 MED ORDER — SODIUM CHLORIDE 0.9 % IV SOLN: Freq: Once | INTRAVENOUS | Status: AC

## 2019-09-23 MED ORDER — ACETAMINOPHEN 325 MG PO TABS: 650.0000 mg | ORAL_TABLET | Freq: Four times a day (QID) | ORAL | Status: DC | PRN

## 2019-09-23 MED ORDER — SODIUM CHLORIDE 0.9 % IV BOLUS: 500.0000 mL | Freq: Once | INTRAVENOUS | Status: DC

## 2019-09-23 MED ORDER — LACTULOSE 10 GM/15ML PO SOLN
20.0000 g | Freq: Three times a day (TID) | ORAL | Status: DC
  Administered 2019-09-23: 20 g via ORAL
  Filled 2019-09-23: qty 30

## 2019-09-23 NOTE — Telephone Encounter (Signed)
Yes, please. Thank you.

## 2019-09-23 NOTE — ED Provider Notes (Signed)
[facility] DEPT Provider Note   CSN: [id number] Arrival date & time: 09/23/19  1321     History Chief Complaint  Patient presents with  . Abdominal Pain  . Abnormal Lab  . Palpitations    Kara Torres is a 49 y.o. female.  HPI Patient is a 49 year old female with a history of of alcohol abuse,--remote--, alpha-1 antitrypsin deficiency, chronic alcoholic cirrhosis with ascites requiring paracentesis every 3 weeks.  History of esophageal varices, and multiple episodes of hyponatremia.  Patient states that she presents today secondary to hyponatremia which was found by her GI doctor during routine lab work yesterday.  Patient states that she came because she was told to and states that she feels relatively well apart from having some generalized abdominal aches which she describes as "gassy cramps ".  She states that she has been eating food that causes her to have gas.  She denies any fevers or chills.  She states she has not had a episode of of SBP.  Denies any chills, fatigue, nausea or current vomiting.  She states that she did vomit Monday and states that there is a small amount of blood in the her vomit which concerned her prompting her to call GI.  When questioned specifically on palpitations patient states that she has had some palpitations intermittently for some time but states that she is uncertain when it started.      Past Medical History:  Diagnosis Date  . Alcohol abuse 06/19/2019  . Allergy   . Alpha-1-antitrypsin deficiency ([place])   . Anxiety   . Ascites due to alcoholic cirrhosis ([place])   . Asthma   . Cataract of both eyes 06/19/2019  . Cataracts, bilateral   . Chronic gastritis without bleeding 06/19/2019  . Chronic seasonal allergic rhinitis due to pollen 10/17/2018  . Daytime sleepiness 04/28/2017  . Depression   . Diarrhea 10/17/2018  . Dyslipidemia 07/13/2016  . Elevated serum creatinine 08/2019  . Esophageal varices ([place])    . Esophageal varices in alcoholic cirrhosis ([place]) 75/04/2584  . Essential hypertension 07/13/2016  . Eye exam abnormal 12/10/2018  . Fatty liver disease, nonalcoholic 27/78/2423  . Generalized anxiety disorder 07/13/2016  . History of cholecystectomy   . Hypocalcemia 08/2019  . Hyponatremia 08/2019  . Liver disease   . Nausea and vomiting 10/17/2018  . Obesity 07/13/2016  . Visual disturbance 12/10/2018  . Vitamin D deficiency 06/2019    Patient Active Problem List   Diagnosis Date Noted  . Cataract of both eyes 06/19/2019  . Chronic gastritis without bleeding 06/19/2019  . Esophageal varices in alcoholic cirrhosis ([place]) 53/61/4431  . Alcohol abuse 06/19/2019  . Visual disturbance 12/10/2018  . Eye exam abnormal 12/10/2018  . Nausea and vomiting 10/17/2018  . Abdominal pain, bilateral upper quadrant 10/17/2018  . Diarrhea 10/17/2018  . Chronic seasonal allergic rhinitis due to pollen 10/17/2018  . Daytime sleepiness 04/28/2017  . Essential hypertension 07/13/2016  . Obesity 07/13/2016  . Dyslipidemia 07/13/2016  . Fatty liver disease, nonalcoholic 54/00/8676  . Generalized anxiety disorder 07/13/2016    Past Surgical History:  Procedure Laterality Date  . CHOLECYSTECTOMY  2000  . IR PARACENTESIS  08/20/2019  . IR PARACENTESIS  09/09/2019     OB History    Gravida  0   Para  0   Term  0   Preterm  0   AB  0   Living  0     SAB  0  TAB  0   Ectopic  0   Multiple  0   Live Births  0           Family History  Problem Relation Age of Onset  . Stroke Mother   . Hyperlipidemia Mother   . Diabetes Mother   . Diabetes Father   . Hypertension Father   . Hyperlipidemia Father   . Mental illness Father   . Stroke Father   . Diabetes Maternal Grandmother   . Heart disease Maternal Grandmother   . Hypertension Maternal Grandmother   . Heart attack Maternal Grandmother   . Colon cancer Other        multiple great aunts, great uncles  . Esophageal  cancer Neg Hx   . Stomach cancer Neg Hx     Social History   Tobacco Use  . Smoking status: Former Smoker    Types: Cigarettes  . Smokeless tobacco: Never Used  Substance Use Topics  . Alcohol use: Not Currently    Alcohol/week: 2.0 standard drinks    Types: 2 Cans of beer per week  . Drug use: Yes    Types: Marijuana    Comment: Marijuana edibles    Home Medications Prior to Admission medications   Medication Sig Start Date End Date Taking? Authorizing Provider  cetirizine (ZYRTEC) 10 MG tablet Take 1 tablet (10 mg total) by mouth daily. Patient taking differently: Take 10 mg by mouth at bedtime.  06/26/19  Yes Azzie Glatter, FNP  CHERRY PO Take 1 tablet by mouth daily.   Yes [provider]  FLUoxetine (PROZAC) 20 MG tablet Take 1 tablet (20 mg total) by mouth daily. 06/26/19  Yes Azzie Glatter, FNP  hydrOXYzine (ATARAX/VISTARIL) 50 MG tablet Take 50-150 mg by mouth 3 (three) times daily as needed for anxiety or itching.    Yes [provider]  loperamide (IMODIUM A-D) 2 MG tablet Take 1 tablet (2 mg total) by mouth 4 (four) times daily as needed for diarrhea or loose stools. 10/17/18  Yes Azzie Glatter, FNP  ondansetron (ZOFRAN) 8 MG tablet Take 8 mg by mouth every 6 (six) hours as needed for nausea or vomiting.  03/23/19  Yes [provider]  pantoprazole (PROTONIX) 40 MG tablet Take 1 tablet (40 mg total) by mouth 2 (two) times daily before a meal. Patient taking differently: Take 40 mg by mouth 2 (two) times daily.  08/25/19  Yes Levin Erp, PA  spironolactone (ALDACTONE) 25 MG tablet Take 1 tablet (25 mg total) by mouth daily. 09/18/19 10/18/19 Yes Thornton Park, MD  atorvastatin (LIPITOR) 40 MG tablet Take 1 tablet (40 mg total) by mouth daily. Patient not taking: Reported on 09/23/2019 06/26/19   Azzie Glatter, FNP  ferrous sulfate (FERROUSUL) 325 (65 FE) MG tablet Take 1 tablet (325 mg total) by mouth every other  day. Patient not taking: Reported on 09/23/2019 08/12/19   Levin Erp, PA  Lactulose 20 GM/30ML SOLN Take 30 mLs (20 g total) by mouth 3 (three) times daily. Patient not taking: Reported on 09/15/2019 09/11/19   Thornton Park, MD  ondansetron (ZOFRAN) 4 MG tablet Take 1 tablet (4 mg total) by mouth every 8 (eight) hours as needed for nausea or vomiting. Patient not taking: Reported on 09/23/2019 10/17/18   Azzie Glatter, FNP  Vitamin D, Ergocalciferol, (DRISDOL) 1.25 MG (50000 UNIT) CAPS capsule Take 1 capsule (50,000 Units total) by mouth every 7 (seven) days. 09/15/19  Azzie Glatter, FNP    Allergies    Epinephrine and Hydrocodone  Review of Systems   Review of Systems  Constitutional: Negative for chills, fatigue and fever.  HENT: Negative for congestion.   Eyes: Negative for pain.  Respiratory: Negative for cough and shortness of breath.   Cardiovascular: Negative for chest pain and leg swelling.  Gastrointestinal: Positive for abdominal pain and vomiting. Negative for nausea.  Genitourinary: Negative for dysuria.  Musculoskeletal: Negative for myalgias.  Skin: Negative for rash.  Neurological: Negative for dizziness and headaches.    Physical Exam Updated Vital Signs BP 100/68   Pulse (!) 101   Temp 98.1 F (36.7 C) (Oral)   Resp 16   SpO2 100%   Physical Exam Vitals and nursing note reviewed.  Constitutional:      General: She is not in acute distress.    Comments: 49 year old female in no acute stress.  Pleasant, able answer questions and follow commands  HENT:     Head: Normocephalic and atraumatic.     Nose: Nose normal.  Eyes:     General: No scleral icterus. Neck:     Comments: No JVP noticeable. Cardiovascular:     Rate and Rhythm: Normal rate and regular rhythm.     Pulses: Normal pulses.     Heart sounds: Normal heart sounds.  Pulmonary:     Effort: Pulmonary effort is normal. No respiratory distress.     Breath sounds: No wheezing.   Abdominal:     Palpations: Abdomen is soft.     Tenderness: There is no abdominal tenderness. There is no guarding.     Comments: Protuberant abdomen with dullness to percussion.  No tenderness to palpation.  No rebound or guarding.  Positive fluid wave  Musculoskeletal:     Cervical back: Normal range of motion.     Right lower leg: No edema.     Left lower leg: No edema.     Comments: No lower extremity edema.  Skin:    General: Skin is warm and dry.     Capillary Refill: Capillary refill takes less than 2 seconds.  Neurological:     Mental Status: She is alert. Mental status is at baseline.  Psychiatric:        Mood and Affect: Mood normal.        Behavior: Behavior normal.     ED Results / Procedures / Treatments   Labs (all labs ordered are listed, but only abnormal results are displayed) Labs Reviewed  COMPREHENSIVE METABOLIC PANEL - Abnormal; Notable for the following components:      Result Value   Sodium 123 (*)    Chloride 90 (*)    Glucose, Bld 63 (*)    Creatinine, Ser 1.21 (*)    Calcium 8.1 (*)    Albumin 2.3 (*)    AST 67 (*)    Alkaline Phosphatase 135 (*)    Total Bilirubin 1.6 (*)    GFR calc non Af Amer 54 (*)    All other components within normal limits  CBC WITH DIFFERENTIAL/PLATELET - Abnormal; Notable for the following components:   RBC 3.54 (*)    Hemoglobin 10.0 (*)    HCT 30.6 (*)    RDW 18.6 (*)    Monocytes Absolute 1.1 (*)    All other components within normal limits  SARS CORONAVIRUS 2 (TAT 6-24 HRS)  AMMONIA  LIPASE, BLOOD    EKG EKG Interpretation  Date/Time:  Wednesday September 23 2019 13:33:24 EST Ventricular Rate:  97 PR Interval:    QRS Duration: 84 QT Interval:  388 QTC Calculation: 493 R Axis:   13 Text Interpretation: Sinus rhythm Ventricular premature complex Low voltage, precordial leads Borderline prolonged QT interval Baseline wander in lead(s) V6 similar to Feb 2021 Confirmed by Sherwood Gambler [phone]) on 09/23/2019  4:02:52 PM   Radiology No results found.  Procedures Procedures (including critical care time)  Medications Ordered in ED Medications  0.9 %  sodium chloride infusion (has no administration in time range)    ED Course  I have reviewed the triage vital signs and the nursing notes.  Pertinent labs & imaging results that were available during my care of the patient were reviewed by me and considered in my medical decision making (see chart for details).  Patient with history of ascites and alcoholic/metabolic cirrhosis.  Patient also have history of hyponatremia however it has been trending downwards over the past month.  Physical exam notable for significant ascites.  Otherwise does not appear fluid volume overloaded.  Sodium of 123.  Glucose 63.  Will provide patient with food and recheck CBG.  Patient is not symptomatic of hypoglycemia.  Creatinine and BUN are at patient's baseline.  Total bilirubin and alk phos elevated as well as AST consistent with cirrhosis.  No new anemia-anemia is stable-or leukocytosis.  Lipase and ammonia within normal months.  Covid test pending.  Clinical Course as of Sep 23 1827  Wed Sep 23, 2019  1827 Discussed case with Dr. Ardis Hughs of with our gastroenterology who recommends admission for correction of hyponatremia.  L. GI will follow patient and see tomorrow in hospital and formally consult--are considering adjustment of diuretics.   Discussed with hospitalist who will admit. Pt on gentle fluids IV.    [WF]    Clinical Course User Index [WF] Tedd Sias, PA   MDM Rules/Calculators/A&P                      I discussed this case with my attending physician who cosigned this note including patient's presenting symptoms, physical exam, and planned diagnostics and interventions. Attending physician stated agreement with plan or made changes to plan which were implemented.   Patient noted to hospital service.  Will be followed by GI.  Final  Clinical Impression(s) / ED Diagnoses Final diagnoses:  Hyponatremia  Ascites due to alcoholic cirrhosis [facility])    Rx / DC Orders ED Discharge Orders    None       Tedd Sias, [place] 09/23/19 1831    Margette Fast, MD 09/28/19 1342

## 2019-09-23 NOTE — Telephone Encounter (Signed)
Spoke to the patient who is going to report to the ED today for hyponatremia (treatment and possible admission).

## 2019-09-23 NOTE — Telephone Encounter (Signed)
Thank you. Please try to reach her again later today. Her sodium level is very worrisome. She needs to be in the hospital. Thank you.

## 2019-09-23 NOTE — Telephone Encounter (Signed)
Thornton Park, MD  Dalene Seltzer, RN  How is she feeling? I'm very concerned about her sodium level. It is low enough that she needs to be in the hospital to get it back to a safer range.       Previous Messages   ----- Message -----  From: Interface, [facility]  Sent: 09/22/2019  4:42 PM EST  To: Thornton Park, MD

## 2019-09-23 NOTE — Telephone Encounter (Signed)
Dr. Tarri Glenn, Kara Torres-  The patient called while bedded in the ED. She is worried about the ascites and her grossly increased abdominal girth. She stated the ED doctors did not seem too concerned about this. I told the patient her hyponatremia was the most pressing issue, it's not that the doctors don't care or are not worried about it. I explained to the patient her hyponatremia needed to be corrected first because a LVP could cause more severe hyponatremia and worsen renal issues (possibly renal failure). She asked about her appointment that is scheduled tomorrow with Tye Savoy. I told the patient to keep the appointment because she still needed to come in to be seen if she is not admitted. She did admit that she did not wanted to go to the ED earlier and has now started to not feel very good. I will call and check in with the patient tomorrow morning.

## 2019-09-23 NOTE — Telephone Encounter (Signed)
Called the patient, unable to reach patient, left a detailed message asking the patient to call back or send in a patient message to let us know how she's feeling. Will await phone call or patient message or attempted to call prior to the end of the day.

## 2019-09-23 NOTE — Telephone Encounter (Signed)
Many thanks for your diligence.

## 2019-09-23 NOTE — Telephone Encounter (Signed)
Spoke to the patient who stated she said feels really good although bloated. She is wondering if it is absolutely necessary that she report to the ED today. She is scheduled to see PG tomorrow at 9 am. Please advise?

## 2019-09-23 NOTE — ED Triage Notes (Signed)
Patient reports that she was called by her PCP and was told her Na+ level-122. Patient c/o abdominal pain/ascites. Patient states she had fluid removed 2 weeks ago. Patient also c/o heart palpitations since yesterday.  Patient states she vomited blood yesterday x 1 approx 4 Tbs,

## 2019-09-23 NOTE — H&P (Signed)
History and Physical    PLEASE NOTE THAT DRAGON DICTATION SOFTWARE WAS USED IN THE CONSTRUCTION OF THIS NOTE.   Kara Torres [id number] DOB: 06-18-1971 DOA: 09/23/2019  PCP: Sueanne Margarita, MD Patient coming from: home   I have personally briefly reviewed patient's old medical records in [place]  Chief Complaint: Low sodium  HPI: Kara Torres is a 49 y.o. 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  In the context of a history of multifactorial cirrhosis, with documented contributions from fatty liver as well as alcoholic cirrhosis, the patient follows with Dr. Modena Nunnery as her outpatient gastroenterologist.  Patient cirrhosis appears complicated by ascites requiring recurrent therapeutic paracentesis.  There is also documented history of esophageal varices.  Outpatient diuretic regimen currently consists of spironolactone, in the absence of any Lasix.   Routine follow-up CMP was performed by Dr. Modena Nunnery as an outpatient on 09/22/2019.  Relative to baseline serum sodium range of 1 25-1 28, this CMP showed a slight decline in serum sodium down to 123, prompting Dr. Modena Nunnery to contact the patient and instruct her to present to the emergency department for further evaluation of acute on chronic hyponatremia.  The patient is without complaint at the time of my encounter with her, and she emphasizes that her presentation to the emergency department today was purely on the basis of the recommendation of her gastroenterologist for quantitative evaluation of a slight decline in serum sodium in the absence of any acute symptoms.  The patient denies  any recent nausea, vomiting, or diarrhea.  The patient reports that she has actually felt constipated over the last several days following preceding discontinuation of her lactulose.  The patient reports that her lactulose was then resumed over the last 2 days, although she still feels that she is constipated.  Denies any associated chest pain, palpitations, diaphoresis, dizziness, presyncope, or syncope.  Denies any recent shortness of breath, orthopnea, PND, or worsening of chronic peripheral edema.  In terms of pharmacologic factors potentially contributing to the patient's acute on chronic hyponatremia, the patient reports that she has been on Prozac for several years, without any recent dose adjustments to this medication.  Aside from spironolactone, the patient denies taking any diuretics as an outpatient, including no HCTZ.  She denies any recent subjective fever, chills, rigors, or generalized myalgias. Denies any recent headache, neck stiffness, rhinitis, rhinorrhea, sore throat. No recent traveling or known COVID-19 exposures. Denies dysuria, gross hematuria, or change in urinary urgency/frequency.    ED Course:  Vital signs in the ED were notable for the following: Temperature max 98.1; heart rate 89-1 01; blood pressure 92/56-107/74; respiratory rate 15-19; oxygen saturation 98 to 90% on room air.  Labs were notable for the following: Presenting CMP reflect serum sodium of 123 compared to 122 on 09/22/2019 and also relative to 125 on 09/17/2019, potassium 3.7, chloride 90, bicarbonate 22, creatinine 1.21 relative to 1.20 on 09/22/2019, alkaline phosphatase 135, AST 37 compared to most recent prior value of 60 on 09/11/2019, ALT 31, and total bilirubin 1.6, which is down slightly from most recent prior value of 2.4 on 09/11/2019.  CBC notable for white blood cell count of  7400, hemoglobin 10, and platelets 206.  Routine screening nasopharyngeal COVID-19 PCR performed in the ED this evening was found to  be negative.  The emergency department physician discussed the patient's case with the on-call gastroenterologist, Dr. Ardis Hughs, who recommended overnight observation to the hospital service for further evaluation and and management of acute on chronic hyponatremia. Dr. Ardis Hughs plans on evaluating the patient in person in the morning, with plan to make further adjustments to outpatient diuretic regimen at that time.  While in the ED, the following were administered: IV normal saline x1 L.    Review of Systems: As per HPI otherwise 10 point review of systems negative.   Past Medical History:  Diagnosis Date  . Alcohol abuse 06/19/2019  . Allergy   . Alpha-1-antitrypsin deficiency ([place])   . Anxiety   . Ascites due to alcoholic cirrhosis ([place])   . Asthma   . Cataract of both eyes 06/19/2019  . Cataracts, bilateral   . Chronic gastritis without bleeding 06/19/2019  . Chronic seasonal allergic rhinitis due to pollen 10/17/2018  . Daytime sleepiness 04/28/2017  . Depression   . Diarrhea 10/17/2018  . Dyslipidemia 07/13/2016  . Elevated serum creatinine 08/2019  . Esophageal varices ([place])   . Esophageal varices in alcoholic cirrhosis ([place]) [id number]  . Essential hypertension 07/13/2016  . Eye exam abnormal 12/10/2018  . Fatty liver disease, nonalcoholic 0000000  . Generalized anxiety disorder 07/13/2016  . History of cholecystectomy   . Hypocalcemia 08/2019  . Hyponatremia 08/2019  . Liver disease   . Nausea and vomiting 10/17/2018  . Obesity 07/13/2016  . Visual disturbance 12/10/2018  . Vitamin D deficiency 06/2019    Past Surgical History:  Procedure Laterality Date  . CHOLECYSTECTOMY  2000  . IR PARACENTESIS  08/20/2019  . IR PARACENTESIS  09/09/2019    Social History:  reports that she has quit smoking. Her smoking use included cigarettes. She has never used smokeless tobacco. She reports previous alcohol use of about 2.0 standard drinks of alcohol per week. She reports  current drug use. Drug: Marijuana.   Allergies  Allergen Reactions  . Epinephrine   . Hydrocodone Rash    Family History  Problem Relation Age of Onset  . Stroke Mother   . Hyperlipidemia Mother   . Diabetes Mother   . Diabetes Father   . Hypertension Father   . Hyperlipidemia Father   . Mental illness Father   . Stroke Father   . Diabetes Maternal Grandmother   . Heart disease Maternal Grandmother   . Hypertension Maternal Grandmother   . Heart attack Maternal Grandmother   . Colon cancer Other        multiple great aunts, great uncles  . Esophageal cancer Neg Hx   . Stomach cancer Neg Hx      Prior to Admission medications   Medication Sig Start Date End Date Taking? Authorizing Provider  cetirizine (ZYRTEC) 10 MG tablet Take 1 tablet (10 mg total) by mouth daily. Patient taking differently: Take 10 mg by mouth at bedtime.  06/26/19  Yes Azzie Glatter, FNP  CHERRY PO Take 1 tablet by mouth daily.   Yes [provider]  FLUoxetine (PROZAC) 20 MG tablet Take 1 tablet (20 mg total) by mouth daily. 06/26/19  Yes Azzie Glatter, FNP  hydrOXYzine (ATARAX/VISTARIL) 50 MG tablet Take 50-150 mg by mouth 3 (three) times daily as needed for anxiety or itching.    Yes [provider]  loperamide (IMODIUM A-D)  2 MG tablet Take 1 tablet (2 mg total) by mouth 4 (four) times daily as needed for diarrhea or loose stools. 10/17/18  Yes Azzie Glatter, FNP  ondansetron (ZOFRAN) 8 MG tablet Take 8 mg by mouth every 6 (six) hours as needed for nausea or vomiting.  03/23/19  Yes [provider]  pantoprazole (PROTONIX) 40 MG tablet Take 1 tablet (40 mg total) by mouth 2 (two) times daily before a meal. Patient taking differently: Take 40 mg by mouth 2 (two) times daily.  08/25/19  Yes Levin Erp, PA  spironolactone (ALDACTONE) 25 MG tablet Take 1 tablet (25 mg total) by mouth daily. 09/18/19 10/18/19 Yes Thornton Park, MD  atorvastatin (LIPITOR) 40  MG tablet Take 1 tablet (40 mg total) by mouth daily. Patient not taking: Reported on 09/23/2019 06/26/19   Azzie Glatter, FNP  ferrous sulfate (FERROUSUL) 325 (65 FE) MG tablet Take 1 tablet (325 mg total) by mouth every other day. Patient not taking: Reported on 09/23/2019 08/12/19   Levin Erp, PA  Lactulose 20 GM/30ML SOLN Take 30 mLs (20 g total) by mouth 3 (three) times daily. Patient not taking: Reported on 09/15/2019 09/11/19   Thornton Park, MD  ondansetron (ZOFRAN) 4 MG tablet Take 1 tablet (4 mg total) by mouth every 8 (eight) hours as needed for nausea or vomiting. Patient not taking: Reported on 09/23/2019 10/17/18   Azzie Glatter, FNP  Vitamin D, Ergocalciferol, (DRISDOL) 1.25 MG (50000 UNIT) CAPS capsule Take 1 capsule (50,000 Units total) by mouth every 7 (seven) days. 09/15/19   Azzie Glatter, FNP     Objective    Physical Exam: Vitals:   09/23/19 1630 09/23/19 1645 09/23/19 1728 09/23/19 1730  BP:   108/74 100/68  Pulse: 97 96 96 (!) 101  Resp: 12 14 12 16   Temp:      TempSrc:      SpO2: 100% 100% 100% 100%    General: appears to be stated age; alert, oriented Skin: warm, dry, no rash Head:  AT/[place] Eyes:  PEARL b/l, EOMI Mouth:  Oral mucosa membranes appear moist, normal dentition Neck: supple; trachea midline Heart:  RRR; did not appreciate any M/R/G Lungs: CTAB, did not appreciate any wheezes, rales, or rhonchi Abdomen: + BS; soft, mild distention; NT Vascular: 2+ pedal pulses b/l; 2+ radial pulses b/l Extremities: trace - 1+ edema in b/l LE's; no muscle wasting Neuro: strength and sensation intact in upper and lower extremities b/l    Labs on Admission: I have personally reviewed following labs and imaging studies  CBC: Recent Labs  Lab 09/17/19 1616 09/23/19 1603  WBC 7.9 7.4  NEUTROABS 4.5 4.3  HGB 10.6* 10.0*  HCT 32.2* 30.6*  MCV 87.2 86.4  PLT 252.0 99991111   Basic Metabolic Panel: Recent Labs  Lab 09/17/19 1616  09/22/19 1515 09/23/19 1603  NA 125* 122* 123*  K 3.3* 3.6 3.7  CL 90* 89* 90*  CO2 25 26 22   GLUCOSE 89 94 63*  BUN 18 18 19   CREATININE 1.28* 1.20 1.21*  CALCIUM 8.6 8.6 8.1*   GFR: Estimated Creatinine Clearance: 50.2 mL/min (A) (by C-G formula based on SCr of 1.21 mg/dL (H)). Liver Function Tests: Recent Labs  Lab 09/23/19 1603  AST 67*  ALT 31  ALKPHOS 135*  BILITOT 1.6*  PROT 6.7  ALBUMIN 2.3*   Recent Labs  Lab 09/23/19 1603  LIPASE 34   Recent Labs  Lab 09/23/19 1603  AMMONIA  15   Coagulation Profile: No results for input(s): INR, PROTIME in the last 168 hours. Cardiac Enzymes: No results for input(s): CKTOTAL, CKMB, CKMBINDEX, TROPONINI in the last 168 hours. BNP (last 3 results) No results for input(s): PROBNP in the last 8760 hours. HbA1C: No results for input(s): HGBA1C in the last 72 hours. CBG: No results for input(s): GLUCAP in the last 168 hours. Lipid Profile: No results for input(s): CHOL, HDL, LDLCALC, TRIG, CHOLHDL, LDLDIRECT in the last 72 hours. Thyroid Function Tests: No results for input(s): TSH, T4TOTAL, FREET4, T3FREE, THYROIDAB in the last 72 hours. Anemia Panel: No results for input(s): VITAMINB12, FOLATE, FERRITIN, TIBC, IRON, RETICCTPCT in the last 72 hours. Urine analysis:    Component Value Date/Time   COLORURINE YELLOW 09/03/2019 1637   APPEARANCEUR CLEAR 09/03/2019 1637   LABSPEC 1.003 (L) 09/03/2019 1637   PHURINE 5.0 09/03/2019 1637   GLUCOSEU NEGATIVE 09/03/2019 1637   HGBUR NEGATIVE 09/03/2019 1637   BILIRUBINUR Moderate 09/15/2019 1004   KETONESUR NEGATIVE 09/03/2019 1637   PROTEINUR Positive (A) 09/15/2019 1004   PROTEINUR NEGATIVE 09/03/2019 1637   UROBILINOGEN 1.0 09/15/2019 1004   UROBILINOGEN 0.2 10/18/2017 1536   NITRITE Negative 09/15/2019 1004   NITRITE NEGATIVE 09/03/2019 1637   LEUKOCYTESUR Negative 09/15/2019 1004   LEUKOCYTESUR NEGATIVE 09/03/2019 1637    Radiological Exams on Admission: No  results found.   Assessment/Plan   Kara Torres is a 49 y.o. female with medical history significant for cirrhosis on the basis of fatty liver as well as alcoholic cirrhosis, chronic hyponatremia with baseline serum sodium of 1 25-1 28, GERD, who is admitted to [facility] on 09/23/2019 with acute on chronic hyponatremia after presenting from home to [facility] Emergency Department for further evaluation of low serum sodium value.   Principal Problem:   Hyponatremia Active Problems:   Fatty liver disease, nonalcoholic   Generalized anxiety disorder   GERD (gastroesophageal reflux disease)   Depression   #) Acute on chronic hyponatremia: In the setting of a history of chronic hypoosmolar hypervolemic hyponatremia in the setting of cirrhosis with ascites associated with baseline serum sodium of 125-128, routine follow-up CMP performed as an outpatient on 09/22/2019, showed slight interval decline in serum sodium from 125 on 09/17/19 to 122 on 09/22/2019, with labs performed in the ED this evening notable for serum sodium of 123.  While the patient appears to be without acute symptoms, the patient's outpatient gastroenterologist recommended that the patient present to the emergency department for further evaluation and management of acute on chronic hyponatremia.  I suspect that the mild acute worsening of her chronic hypoosmolar hypervolemic hyponatremia is on the basis of hypervolemia in the setting of underlying cirrhosis with ascites, and that she may benefit from some optimization of her outpatient diuretic regimen, which includes spironolactone in the absence of any Lasix at this time.  That being said, I will expand work-up to include evaluation for any contribution from SIADH via evaluation of urine studies, and will also evaluate for any contribution from hypothyroidism.  There may be some contribution to the chronic aspect of the patient's hyponatremia from outpatient  Prozac, although the patient conveys that she has been on the same dose of this medication for several years now.  Not on any HCTZ at home.  Of note, the patient has received 1 L of IV normal saline in the ED this evening, which, if there is any contribution from SIADH, may actually worsen patient serum sodium level.  The patient's case was discussed with the on-call gastroenterologist, Dr. Ardis Hughs, who recommended overnight observation to the hospital service for further evaluation and and management of acute on chronic hyponatremia. Dr. Ardis Hughs plans on evaluating the patient in person in the morning, with plan to make further adjustments to outpatient diuretic regimen at that time.   Plan: In evaluating for any contribution from SIADH, will check random urine sodium as well as urine osmolality.  Could also consider checking serum uric acid as SIADH is typically associated with hyperuricuria causing a depressed serum uric acid level.  Check TSH.  Monitor strict I's and O's and daily weights.  Repeat CMP in the morning.  We will continue existing spironolactone, with anticipation Dr. Eugenia Pancoast evaluation of the patient in the morning at which time his anticipated that additional modifications to home diuretic regimen will occur.  Will refrain from additional IV fluids at this time, pending evaluation for SIADH, as above.  will continue home Prozac, as, per patient's report, she has been on this medication at the same dose for several years now.      #) Cirrhosis: Per chart review, it appears that the patient cirrhosis is multifactorial with contributions from fatty liver as well as a history of alcoholic cirrhosis.  This is complicated by ascites requiring recurrent therapeutic paracenteses.  Outpatient diuretic regimen limited to spironolactone, as further noted above.  Currently awaiting result INR in order to calculate meld score.  Plan: We will follow for results of INR.  Repeat CMP and INR in the  morning.  Continue home spironolactone for now, with plan for gastroenterology to evaluate the patient in person in the morning, with potential modifications to current diuretic regimen to occur at that time.  Monitor strict I's and O's and daily weights.     #) GERD: On Protonix as an outpatient.  Plan: Continue home PPI.     #) Generalized anxiety disorder: On Prozac as well as as needed hydroxyzine.  Plan: Continue home SSRI and as needed hydroxyzine.     #) Depression: On Prozac as an outpatient, with patient reporting no recent dose adjustments to this medication.  Plan: Continue home Prozac.    DVT prophylaxis: SCDs Code Status: Full code Family Communication: none Disposition Plan: Per Rounding Team Consults called: Patient's case was discussed with the on-call gastroenterologist, Dr. Ardis Hughs, as further described above. Admission status: Observation; med-tele.     PLEASE NOTE THAT DRAGON DICTATION SOFTWARE WAS USED IN THE CONSTRUCTION OF THIS NOTE.   [facility] Pager [phone] From [place].   Otherwise, please contact night-coverage  [email or web address] Password [facility]  09/23/2019, 6:24 PM

## 2019-09-23 NOTE — Telephone Encounter (Signed)
Her sodium is lower than it has ever been. If she has any difficulties with mentation, confusion, nausea, vomiting, headache, loss of energy more than normal, drowsiness, muscle weakness/cramps/spasms she should not wait. If the sodium goes lower, she is at risk for seizure and coma. Thank you.

## 2019-09-23 NOTE — Telephone Encounter (Signed)
Please be advised the patient has checked in and triaged at [facility] ED.

## 2019-09-23 NOTE — Telephone Encounter (Signed)
Thank you :)

## 2019-09-23 NOTE — Telephone Encounter (Signed)
Dr. Tarri Glenn, did you want me to tell the patient to report to the ED for possible admission?

## 2019-09-23 NOTE — ED Notes (Signed)
UA and culture sent down to lab to hold

## 2019-09-24 ENCOUNTER — Ambulatory Visit: Payer: Medicaid Other | Admitting: Nurse Practitioner

## 2019-09-24 ENCOUNTER — Encounter (HOSPITAL_COMMUNITY): Payer: Self-pay | Admitting: Internal Medicine

## 2019-09-24 ENCOUNTER — Telehealth: Payer: Self-pay | Admitting: Gastroenterology

## 2019-09-24 DIAGNOSIS — F329 Major depressive disorder, single episode, unspecified: Secondary | ICD-10-CM | POA: Diagnosis present

## 2019-09-24 DIAGNOSIS — K219 Gastro-esophageal reflux disease without esophagitis: Secondary | ICD-10-CM | POA: Diagnosis present

## 2019-09-24 DIAGNOSIS — F32A Depression, unspecified: Secondary | ICD-10-CM | POA: Diagnosis present

## 2019-09-24 LAB — SARS CORONAVIRUS 2 (TAT 6-24 HRS): SARS Coronavirus 2: NEGATIVE

## 2019-09-24 MED ORDER — PANTOPRAZOLE SODIUM 40 MG PO TBEC: 40.0000 mg | DELAYED_RELEASE_TABLET | Freq: Two times a day (BID) | ORAL | Status: DC

## 2019-09-24 MED ORDER — HYDROXYZINE HCL 25 MG PO TABS: 50.0000 mg | ORAL_TABLET | Freq: Three times a day (TID) | ORAL | Status: DC | PRN

## 2019-09-24 MED ORDER — FLUOXETINE HCL 20 MG PO CAPS: 20.0000 mg | ORAL_CAPSULE | Freq: Every day | ORAL | Status: DC

## 2019-09-24 MED ORDER — ONDANSETRON HCL 4 MG/2ML IJ SOLN
4.0000 mg | Freq: Four times a day (QID) | INTRAMUSCULAR | Status: DC | PRN
  Administered 2019-09-24: 4 mg via INTRAVENOUS
  Filled 2019-09-24: qty 2

## 2019-09-24 MED ORDER — SODIUM CHLORIDE 0.9% FLUSH: 3.0000 mL | Freq: Two times a day (BID) | INTRAVENOUS | Status: DC

## 2019-09-24 MED ORDER — SPIRONOLACTONE 25 MG PO TABS: 25.0000 mg | ORAL_TABLET | Freq: Every day | ORAL | Status: DC

## 2019-09-24 NOTE — Telephone Encounter (Signed)
Kara Torres, She was admitted last night to [facility] for her worsening hyponatremia with plan to adjust her diuretics +/- LVP to get better control of her fluids, electrolytes.  She left AMA at about 5AM however.  I think she has an appt with PG this morning, hopefully she will show.  Radonna Ricker

## 2019-09-24 NOTE — Progress Notes (Signed)
Cardiology Office Consult Note    Date:  09/25/2019   ID:  Rickiya Maglio Bulson, DOB [birth year], MRN [id number]  PCP:  Azzie Glatter, FNP  Cardiologist:  Fransico Him, MD   Chief Complaint  Patient presents with  . New Patient (Initial Visit)    abnormal EKG    History of Present Illness:  Kara Torres is a 49 y.o. female who is being seen today for the evaluation of  abnormal EKG at the request of Azzie Glatter, FNP.  This is a 49yo female with a hx of ETOH abuse, alcoholic cirrhosis, alpha 1 antitrypsin def, depression, esophageal varices and HTN.  She is referred today for evaluation palpitations and abnormal EKG. She was admitted to  [facility] 3/3 with acute on chronic hyponatremia (baseline Na 125-128) by recommendation of her GI MD after routine labs were done.  Na on admit was 122.  She was asymptomatic on exam.  She left AMA with no further evaluation or treatment.   She was seen by her PCP in Feb 2021 for HTN followup and was noted to have a prolonged QTc on recent EKG at 523msec with low voltage and nonspecific T wave abnormality.  She is now referred to Cards for evaluation.  She tells me that her PCP was concerned that with her low Na level she may be at risk for cardiac arrest or MI.  She denies any chest pain or pressure but does complain of DOE which she attributes to the enlargement of her abdomen from the marked ascites.  She recently had 9L of fluid pulled off with paracentesis and lost 20lbs but has gained back 15lbs and her abdomen is quite large.  She denies any PND, orthopnea or syncope. SHe intermittently has LE edema when her ascites gets bad.  She occasionally has some Dizziness if getting up too fast but no syncope.  She is compliant with her meds and is tolerating meds with no SE.  She is on a significant number of QT prolonging drugs.    Past Medical History:  Diagnosis Date  . Alcohol abuse 06/19/2019  . Allergy   . Alpha-1-antitrypsin deficiency ([place])     . Anxiety   . Ascites due to alcoholic cirrhosis ([place])   . Asthma   . Cataract of both eyes 06/19/2019  . Cataracts, bilateral   . Chronic gastritis without bleeding 06/19/2019  . Chronic seasonal allergic rhinitis due to pollen 10/17/2018  . Daytime sleepiness 04/28/2017  . Depression   . Diarrhea 10/17/2018  . Dyslipidemia 07/13/2016  . Elevated serum creatinine 08/2019  . Esophageal varices ([place])   . Esophageal varices in alcoholic cirrhosis ([place]) [id number]  . Essential hypertension 07/13/2016  . Eye exam abnormal 12/10/2018  . Fatty liver disease, nonalcoholic 0000000  . Generalized anxiety disorder 07/13/2016  . History of cholecystectomy   . Hypocalcemia 08/2019  . Hyponatremia 08/2019  . Liver disease   . Nausea and vomiting 10/17/2018  . Obesity 07/13/2016  . Visual disturbance 12/10/2018  . Vitamin D deficiency 06/2019    Past Surgical History:  Procedure Laterality Date  . CHOLECYSTECTOMY  2000  . IR PARACENTESIS  08/20/2019  . IR PARACENTESIS  09/09/2019    Current Medications: Current Meds  Medication Sig  . atorvastatin (LIPITOR) 40 MG tablet Take 1 tablet (40 mg total) by mouth daily.  . cetirizine (ZYRTEC) 10 MG tablet Take 1 tablet (10 mg total) by mouth daily. (Patient taking differently: Take 10 mg by mouth  at bedtime. )  . CHERRY PO Take 1 tablet by mouth daily.  . ferrous sulfate (FERROUSUL) 325 (65 FE) MG tablet Take 1 tablet (325 mg total) by mouth every other day.  Marland Kitchen FLUoxetine (PROZAC) 20 MG tablet Take 1 tablet (20 mg total) by mouth daily.  . hydrOXYzine (ATARAX/VISTARIL) 50 MG tablet Take 50-150 mg by mouth 3 (three) times daily as needed for anxiety or itching.   . Lactulose 20 GM/30ML SOLN Take 30 mLs (20 g total) by mouth 3 (three) times daily.  Marland Kitchen loperamide (IMODIUM A-D) 2 MG tablet Take 1 tablet (2 mg total) by mouth 4 (four) times daily as needed for diarrhea or loose stools.  . ondansetron (ZOFRAN) 4 MG tablet Take 1 tablet (4 mg total) by  mouth every 8 (eight) hours as needed for nausea or vomiting.  . ondansetron (ZOFRAN) 8 MG tablet Take 8 mg by mouth every 6 (six) hours as needed for nausea or vomiting.   . pantoprazole (PROTONIX) 40 MG tablet Take 1 tablet (40 mg total) by mouth 2 (two) times daily before a meal. (Patient taking differently: Take 40 mg by mouth 2 (two) times daily. )  . spironolactone (ALDACTONE) 25 MG tablet Take 1 tablet (25 mg total) by mouth daily.  . Vitamin D, Ergocalciferol, (DRISDOL) 1.25 MG (50000 UNIT) CAPS capsule Take 1 capsule (50,000 Units total) by mouth every 7 (seven) days.    Allergies:   Epinephrine and Hydrocodone   Social History   Socioeconomic History  . Marital status: Divorced    Spouse name: Not on file  . Number of children: Not on file  . Years of education: Not on file  . Highest education level: Not on file  Occupational History  . Occupation: Therapist, art  Tobacco Use  . Smoking status: Former Smoker    Types: Cigarettes  . Smokeless tobacco: Never Used  Substance and Sexual Activity  . Alcohol use: Not Currently    Alcohol/week: 2.0 standard drinks    Types: 2 Cans of beer per week  . Drug use: Yes    Types: Marijuana    Comment: Marijuana edibles  . Sexual activity: Not Currently  Other Topics Concern  . Not on file  Social History Narrative  . Not on file   Social Determinants of Health   Financial Resource Strain:   . Difficulty of Paying Living Expenses: Not on file  Food Insecurity:   . Worried About Charity fundraiser in the Last Year: Not on file  . Ran Out of Food in the Last Year: Not on file  Transportation Needs:   . Lack of Transportation (Medical): Not on file  . Lack of Transportation (Non-Medical): Not on file  Physical Activity:   . Days of Exercise per Week: Not on file  . Minutes of Exercise per Session: Not on file  Stress:   . Feeling of Stress : Not on file  Social Connections:   . Frequency of Communication with Friends  and Family: Not on file  . Frequency of Social Gatherings with Friends and Family: Not on file  . Attends Religious Services: Not on file  . Active Member of Clubs or Organizations: Not on file  . Attends Archivist Meetings: Not on file  . Marital Status: Not on file     Family History:  The patient's family history includes Colon cancer in an other family member; Diabetes in her father, maternal grandmother, and mother; Heart attack in  her maternal grandmother; Heart disease in her maternal grandmother; Hyperlipidemia in her father and mother; Hypertension in her father and maternal grandmother; Mental illness in her father; Stroke in her father and mother.   ROS:   Please see the history of present illness.    ROS All other systems reviewed and are negative.  No flowsheet data found.     PHYSICAL EXAM:   VS:  BP (!) 102/56   Pulse 80   Ht 5\' 4"  (1.626 m)   Wt 151 lb 3.2 oz (68.6 kg)   SpO2 97%   BMI 25.95 kg/m    GEN: Well nourished, well developed, in no acute distress  HEENT: normal  Neck: no JVD, carotid bruits, or masses Cardiac: RRR; no murmurs, rubs, or gallops,no edema.  Intact distal pulses bilaterally.  Respiratory:  clear to auscultation bilaterally, normal work of breathing GI: soft, nontender, nondistended, + BS MS: no deformity or atrophy  Skin: warm and dry, no rash Neuro:  Alert and Oriented x 3, Strength and sensation are intact Psych: euthymic mood, full affect  Wt Readings from Last 3 Encounters:  09/25/19 151 lb 3.2 oz (68.6 kg)  09/23/19 142 lb 3.2 oz (64.5 kg)  09/15/19 141 lb 3.2 oz (64 kg)      Studies/Labs Reviewed:   EKG:  EKG is not ordered today.    Recent Labs: 06/17/2019: TSH 2.130 09/03/2019: B Natriuretic Peptide 91.4 09/23/2019: ALT 31; BUN 19; Creatinine, Ser 1.21; Hemoglobin 10.0; Platelets 206; Potassium 3.7; Sodium 123   Lipid Panel    Component Value Date/Time   CHOL 126 06/17/2019 0837   TRIG 76 06/17/2019 0837     HDL 34 (L) 06/17/2019 0837   CHOLHDL 3.7 06/17/2019 0837   CHOLHDL 3.8 11/02/2016 0804   VLDL 26 11/02/2016 0804   LDLCALC 77 06/17/2019 0837    Additional studies/ records that were reviewed today include:  Office notes from PCP, ER notes from 09/23/19, EKG from 09/03/2019    ASSESSMENT:    1. Prolonged Q-T interval on ECG   2. Essential hypertension      PLAN:  In order of problems listed above:  1.  Prolonged QTc -EKG from 09/03/2019 reviewed and showed a QT of 4102msec and QTc of 534msec -she is on Fluoxetine, Immodium and Zofran which are known to prolong the QT interval -I have recommended that she stop the Immodium and Zofran -I have also recommended that she followup with her Psychiatrist to discuss changing from Fluoxetine to another medication that does not prolong the QT interval - I will forward him a copy of this note -will repeat EKG in 2 weeks after she is off these meds -she denies any hx of syncope -I will have her see my PA back in 4 weeks and if QTc is still prolonged then consider referral to EP  2.  HTN -BP controlled on exam today -continue spiro 25mg  daily  3.  SOB -likely related to markedly enlarged abdomen from worsening ascites -check 2D echo to assess LVF and make sure her hyponatremia is all from cirrhosis and not a heart failure component as well.   -I have asked her to call her GI MD when she gets home today to let him know that she had gained back a lot of weight and her stomach is very swollen    Medication Adjustments/Labs and Tests Ordered: Current medicines are reviewed at length with the patient today.  Concerns regarding medicines are outlined above.  Medication  changes, Labs and Tests ordered today are listed in the Patient Instructions below.  There are no Patient Instructions on file for this visit.   Signed, Fransico Him, MD  09/25/2019 9:53 AM    [address], [place], [place]   [address] Phone: [phone]; Fax: [phone]

## 2019-09-24 NOTE — Telephone Encounter (Signed)
Thanks for the update

## 2019-09-24 NOTE — Progress Notes (Signed)
Patient signed AMA papers to leave because she stated she feels much better then she did when she came in. Writer educated on the risks of leaving. Writer made provider and [employer] aware. IV catheder was removed and intact. Telemetry box was removed as well.

## 2019-09-24 NOTE — Discharge Summary (Signed)
Physician Discharge Summary  Patient ID: Kara Torres MRN: [id number] DOB/AGE: August 03, 1970 49 y.o.  Admit date: 09/23/2019 Discharge date: 09/24/2019  Admission Diagnoses: Principal Problem:   Hyponatremia Active Problems:   Fatty liver disease, nonalcoholic   Generalized anxiety disorder   GERD (gastroesophageal reflux disease)   Depression  Discharge Diagnoses:  Principal Problem:   Hyponatremia Active Problems:   Fatty liver disease, nonalcoholic   Generalized anxiety disorder   GERD (gastroesophageal reflux disease)   Depression   Discharged Condition: fair; patient left the hospital AMA within a few hours of being admitted.   Hospital Course:   Kara Torres is a 49 y.o. female with medical history significant for cirrhosis on the basis of fatty liver as well as alcoholic cirrhosis, chronic hyponatremia with baseline serum sodium of 125-128, GERD, who was admitted to [facility] on 09/23/2019 with acute on chronic hyponatremia after presenting from home to [facility] Emergency Department at the instruction of her outpatient gastroenterologist, Dr. Modena Nunnery, for further evaluation of low serum sodium value performed by Dr. Modena Nunnery as an outpatient.  Routine follow-up CMP was performed by Dr. Modena Nunnery as an outpatient on 09/22/2019.  Relative to baseline serum sodium range of 125-128, this CMP showed a slight decline in serum sodium down to 122, prompting Dr. Modena Nunnery to contact the patient and instruct her to present to the ED for further evaluation of acute on chronic hyponatremia. The patient remained without acute complaint throughout her hospital course emphasizes that her presentation to the emergency department today was purely on the basis of the recommendation of her gastroenterologist for quantitative evaluation of a slight decline in serum sodium in the absence of any acute symptoms, including no recent nausea, vomiting, diarrhea, dizziness, CP, SOB,  orthopnea, PND, or worsening of chronic peripheral edema. She also denied any history of seizures or recent trauma.     In the [facility] long emergency department on 09/23/2019, repeat CMP demonstrated slight interval increase in serum sodium to 123, without need for glycemic correction.  This CMP also demonstrated persistent but stable mild AST predominant transaminitis, with interval decline in mildly elevated total bilirubin.  Nasopharyngeal COVID-19 by PCR was performed in the ED, and found to be negative.  Subsequently, the patient's case and updated laboratory studies were discussed with the on-call gastroenterologist, Dr. Ardis Hughs, who recommended overnight observation to the hospitalist service for further evaluation and management of acute on chronic hyponatremia, with plan for Dr. Ardis Hughs  to see the patient in the hospital in the morning, with anticipation that he would make further adjustments to outpatient diuretic regimen at that time.  While still in the ED, the patient received a total of 1 L of IV normal saline ordered by the ED physician.  Subsequently, the patient was admitted to the [place] ward for overnight observation for additional work-up and management of acute on chronic hypoosmolar hyponatremia, per recommendation of GI, as above.   As further expansion of work-up for patient's acute on chronic hypoosmolar hyponatremia, the following studies were ordered but could not be completed as the patient left the hospital AMA prior to the collection of these labs: Random urine sodium, urine osmolality, and TSH.  Additionally, repeat CMP has been ordered to be collected on the morning of 09/24/2019 to further trend serum sodium values. The patient subsequently left the hospital AMA at approximately 0500 on the morning of 09/24/19, which corresponded to approximately 3 hours following the conclusion of my swing shift.  Of note,  the patient left the hospital AMA prior to being seen by Dr. Ardis Hughs of GI.   Consequently, no modifications were made to her outpatient diuretic regimen.   Per the patient at the time of admission, she has a previously scheduled follow-up appointment with her outpatient gastroenterologist, Dr. Modena Nunnery, scheduled later in the day on 09/24/19.     Consults:  1) Gastroenterology (Dr. Ardis Hughs): patient's case with the on-call gastroenterologist, Dr. Ardis Hughs, who recommended overnight observation to the hospitalist service for further evaluation and management of acute on chronic hyponatremia. Dr. Ardis Hughs  conveyed that he planned on evaluating the patient in person in the hospital on the morning of 09/24/2019, with plan to make further adjustments to outpatient diuretic regimen at that time.  However, and left the hospital AMA prior to being seen by Dr. Ardis Hughs.   Significant Diagnostic Studies:  The following laboratory studies were ordered to further evaluate the patient's presenting acute on chronic hyponatremia, although the patient left the hospital AMA prior to her ability to perform these laboratory studies: Random urine sodium, urine osmolality, andTSH.   Treatments: IV hydration: As described above, the patient received a liter of IV normal saline ordered in the emergency department.    Discharge Exam: Blood pressure 104/72, pulse 90, temperature 98 F (36.7 C), temperature source Oral, resp. rate 16, height 5\' 4"  (1.626 m), weight 64.5 kg, SpO2 100 %. General: appears to be stated age; alert, oriented Skin: warm, dry, no rash Head:  AT/[place] Eyes:  PEARL b/l, EOMI Mouth:  Oral mucosa membranes appear moist, normal dentition Neck: supple; trachea midline Heart:  RRR; did not appreciate any M/R/G Lungs: CTAB, did not appreciate any wheezes, rales, or rhonchi Abdomen: + BS; soft, mild distention; NT Vascular: 2+ pedal pulses b/l; 2+ radial pulses b/l Extremities: trace - 1+ edema in b/l LE's; no muscle wasting Neuro: strength and sensation intact in upper and lower  extremities b/l    Disposition: The patient left the hospital AMA at approximately 5 AM on 09/24/2019.   Allergies as of 09/24/2019      Reactions   Epinephrine    Hydrocodone Rash      Medication List    ASK your doctor about these medications   atorvastatin 40 MG tablet Commonly known as: LIPITOR Take 1 tablet (40 mg total) by mouth daily.   cetirizine 10 MG tablet Commonly known as: ZYRTEC Take 1 tablet (10 mg total) by mouth daily.   CHERRY PO Take 1 tablet by mouth daily.   ferrous sulfate 325 (65 FE) MG tablet Commonly known as: FerrouSul Take 1 tablet (325 mg total) by mouth every other day.   FLUoxetine 20 MG tablet Commonly known as: PROZAC Take 1 tablet (20 mg total) by mouth daily. Ask about: Which instructions should I use?   hydrOXYzine 50 MG tablet Commonly known as: ATARAX/VISTARIL Take 50-150 mg by mouth 3 (three) times daily as needed for anxiety or itching.   Lactulose 20 GM/30ML Soln Take 30 mLs (20 g total) by mouth 3 (three) times daily.   loperamide 2 MG tablet Commonly known as: Imodium A-D Take 1 tablet (2 mg total) by mouth 4 (four) times daily as needed for diarrhea or loose stools.   ondansetron 4 MG tablet Commonly known as: Zofran Take 1 tablet (4 mg total) by mouth every 8 (eight) hours as needed for nausea or vomiting.   ondansetron 8 MG tablet Commonly known as: ZOFRAN Take 8 mg by mouth every 6 (six) hours  as needed for nausea or vomiting.   pantoprazole 40 MG tablet Commonly known as: PROTONIX Take 1 tablet (40 mg total) by mouth 2 (two) times daily before a meal.   spironolactone 25 MG tablet Commonly known as: Aldactone Take 1 tablet (25 mg total) by mouth daily.   Vitamin D (Ergocalciferol) 1.25 MG (50000 UNIT) Caps capsule Commonly known as: DRISDOL Take 1 capsule (50,000 Units total) by mouth every 7 (seven) days.        Signed:  Babs Bertin, DO Hospitalist  09/24/2019, 6:02 PM

## 2019-09-25 ENCOUNTER — Other Ambulatory Visit: Payer: Self-pay

## 2019-09-25 ENCOUNTER — Encounter (HOSPITAL_COMMUNITY): Payer: Self-pay | Admitting: Emergency Medicine

## 2019-09-25 ENCOUNTER — Ambulatory Visit (INDEPENDENT_AMBULATORY_CARE_PROVIDER_SITE_OTHER): Payer: Medicaid Other | Admitting: Cardiology

## 2019-09-25 ENCOUNTER — Encounter: Payer: Self-pay | Admitting: Cardiology

## 2019-09-25 ENCOUNTER — Inpatient Hospital Stay (HOSPITAL_COMMUNITY)
Admission: EM | Admit: 2019-09-25 | Discharge: 2019-09-27 | DRG: 640 | Disposition: A | Discharge status: Home / Self Care | Payer: Medicaid Other | Attending: Internal Medicine | Admitting: Internal Medicine

## 2019-09-25 VITALS — BP 102/56 | HR 80 | Ht 64.0 in | Wt 151.2 lb

## 2019-09-25 DIAGNOSIS — K3189 Other diseases of stomach and duodenum: Secondary | ICD-10-CM | POA: Diagnosis present

## 2019-09-25 DIAGNOSIS — N183 Chronic kidney disease, stage 3 unspecified: Secondary | ICD-10-CM | POA: Diagnosis present

## 2019-09-25 DIAGNOSIS — R0602 Shortness of breath: Secondary | ICD-10-CM

## 2019-09-25 DIAGNOSIS — Z888 Allergy status to other drugs, medicaments and biological substances status: Secondary | ICD-10-CM

## 2019-09-25 DIAGNOSIS — F329 Major depressive disorder, single episode, unspecified: Secondary | ICD-10-CM | POA: Diagnosis present

## 2019-09-25 DIAGNOSIS — K7031 Alcoholic cirrhosis of liver with ascites: Secondary | ICD-10-CM | POA: Diagnosis present

## 2019-09-25 DIAGNOSIS — R9431 Abnormal electrocardiogram [ECG] [EKG]: Secondary | ICD-10-CM | POA: Diagnosis not present

## 2019-09-25 DIAGNOSIS — R4189 Other symptoms and signs involving cognitive functions and awareness: Secondary | ICD-10-CM | POA: Diagnosis present

## 2019-09-25 DIAGNOSIS — N179 Acute kidney failure, unspecified: Secondary | ICD-10-CM | POA: Diagnosis present

## 2019-09-25 DIAGNOSIS — I10 Essential (primary) hypertension: Secondary | ICD-10-CM | POA: Diagnosis not present

## 2019-09-25 DIAGNOSIS — E785 Hyperlipidemia, unspecified: Secondary | ICD-10-CM | POA: Diagnosis present

## 2019-09-25 DIAGNOSIS — E8801 Alpha-1-antitrypsin deficiency: Secondary | ICD-10-CM | POA: Diagnosis present

## 2019-09-25 DIAGNOSIS — Z818 Family history of other mental and behavioral disorders: Secondary | ICD-10-CM

## 2019-09-25 DIAGNOSIS — Z8249 Family history of ischemic heart disease and other diseases of the circulatory system: Secondary | ICD-10-CM

## 2019-09-25 DIAGNOSIS — Z87891 Personal history of nicotine dependence: Secondary | ICD-10-CM

## 2019-09-25 DIAGNOSIS — F411 Generalized anxiety disorder: Secondary | ICD-10-CM | POA: Diagnosis present

## 2019-09-25 DIAGNOSIS — K767 Hepatorenal syndrome: Secondary | ICD-10-CM | POA: Diagnosis present

## 2019-09-25 DIAGNOSIS — E876 Hypokalemia: Secondary | ICD-10-CM | POA: Diagnosis present

## 2019-09-25 DIAGNOSIS — E8770 Fluid overload, unspecified: Secondary | ICD-10-CM | POA: Diagnosis not present

## 2019-09-25 DIAGNOSIS — Z885 Allergy status to narcotic agent status: Secondary | ICD-10-CM

## 2019-09-25 DIAGNOSIS — Z833 Family history of diabetes mellitus: Secondary | ICD-10-CM

## 2019-09-25 DIAGNOSIS — E871 Hypo-osmolality and hyponatremia: Secondary | ICD-10-CM | POA: Diagnosis not present

## 2019-09-25 DIAGNOSIS — Z8349 Family history of other endocrine, nutritional and metabolic diseases: Secondary | ICD-10-CM

## 2019-09-25 DIAGNOSIS — D631 Anemia in chronic kidney disease: Secondary | ICD-10-CM | POA: Diagnosis present

## 2019-09-25 DIAGNOSIS — K766 Portal hypertension: Secondary | ICD-10-CM | POA: Diagnosis present

## 2019-09-25 DIAGNOSIS — I8510 Secondary esophageal varices without bleeding: Secondary | ICD-10-CM | POA: Diagnosis present

## 2019-09-25 DIAGNOSIS — I129 Hypertensive chronic kidney disease with stage 1 through stage 4 chronic kidney disease, or unspecified chronic kidney disease: Secondary | ICD-10-CM | POA: Diagnosis present

## 2019-09-25 DIAGNOSIS — Z823 Family history of stroke: Secondary | ICD-10-CM

## 2019-09-25 DIAGNOSIS — R188 Other ascites: Secondary | ICD-10-CM

## 2019-09-25 DIAGNOSIS — Z20822 Contact with and (suspected) exposure to covid-19: Secondary | ICD-10-CM | POA: Diagnosis present

## 2019-09-25 DIAGNOSIS — K219 Gastro-esophageal reflux disease without esophagitis: Secondary | ICD-10-CM | POA: Diagnosis present

## 2019-09-25 LAB — CBC WITH DIFFERENTIAL/PLATELET
Abs Immature Granulocytes: 0.02 10*3/uL (ref 0.00–0.07)
Basophils Absolute: 0 10*3/uL (ref 0.0–0.1)
Basophils Relative: 1 %
Eosinophils Absolute: 0.2 10*3/uL (ref 0.0–0.5)
Eosinophils Relative: 2 %
HCT: 31.1 % — ABNORMAL LOW (ref 36.0–46.0)
Hemoglobin: 10 g/dL — ABNORMAL LOW (ref 12.0–15.0)
Immature Granulocytes: 0 %
Lymphocytes Relative: 22 %
Lymphs Abs: 1.4 10*3/uL (ref 0.7–4.0)
MCH: 28.5 pg (ref 26.0–34.0)
MCHC: 32.2 g/dL (ref 30.0–36.0)
MCV: 88.6 fL (ref 80.0–100.0)
Monocytes Absolute: 0.5 10*3/uL (ref 0.1–1.0)
Monocytes Relative: 9 %
Neutro Abs: 4.1 10*3/uL (ref 1.7–7.7)
Neutrophils Relative %: 66 %
Platelets: 211 10*3/uL (ref 150–400)
RBC: 3.51 MIL/uL — ABNORMAL LOW (ref 3.87–5.11)
RDW: 18.6 % — ABNORMAL HIGH (ref 11.5–15.5)
WBC: 6.3 10*3/uL (ref 4.0–10.5)
nRBC: 0 % (ref 0.0–0.2)

## 2019-09-25 LAB — COMPREHENSIVE METABOLIC PANEL
ALT: 36 U/L (ref 0–44)
AST: 69 U/L — ABNORMAL HIGH (ref 15–41)
Albumin: 2.6 g/dL — ABNORMAL LOW (ref 3.5–5.0)
Alkaline Phosphatase: 135 U/L — ABNORMAL HIGH (ref 38–126)
Anion gap: 10 (ref 5–15)
BUN: 19 mg/dL (ref 6–20)
CO2: 23 mmol/L (ref 22–32)
Calcium: 8.3 mg/dL — ABNORMAL LOW (ref 8.9–10.3)
Chloride: 93 mmol/L — ABNORMAL LOW (ref 98–111)
Creatinine, Ser: 1.36 mg/dL — ABNORMAL HIGH (ref 0.44–1.00)
GFR calc Af Amer: 54 mL/min — ABNORMAL LOW (ref >60)
GFR calc non Af Amer: 47 mL/min — ABNORMAL LOW (ref >60)
Glucose, Bld: 94 mg/dL (ref 70–99)
Potassium: 3.2 mmol/L — ABNORMAL LOW (ref 3.5–5.1)
Sodium: 126 mmol/L — ABNORMAL LOW (ref 135–145)
Total Bilirubin: 1.4 mg/dL — ABNORMAL HIGH (ref 0.3–1.2)
Total Protein: 7 g/dL (ref 6.5–8.1)

## 2019-09-25 LAB — PROTIME-INR
INR: 1.3 — ABNORMAL HIGH (ref 0.8–1.2)
Prothrombin Time: 16.4 seconds — ABNORMAL HIGH (ref 11.4–15.2)

## 2019-09-25 LAB — MAGNESIUM: Magnesium: 2.2 mg/dL (ref 1.7–2.4)

## 2019-09-25 LAB — AMMONIA: Ammonia: 17 umol/L (ref 9–35)

## 2019-09-25 MED ORDER — ATORVASTATIN CALCIUM 40 MG PO TABS
40.0000 mg | ORAL_TABLET | Freq: Every day | ORAL | Status: DC
  Administered 2019-09-25 – 2019-09-26 (×2): 40 mg via ORAL
  Filled 2019-09-25 (×3): qty 1

## 2019-09-25 MED ORDER — HYDROXYZINE HCL 25 MG PO TABS
50.0000 mg | ORAL_TABLET | Freq: Three times a day (TID) | ORAL | Status: DC | PRN
  Administered 2019-09-25 – 2019-09-26 (×2): 150 mg via ORAL
  Filled 2019-09-25 (×2): qty 6

## 2019-09-25 MED ORDER — PANTOPRAZOLE SODIUM 40 MG PO TBEC
40.0000 mg | DELAYED_RELEASE_TABLET | Freq: Two times a day (BID) | ORAL | Status: DC
  Administered 2019-09-25 – 2019-09-27 (×4): 40 mg via ORAL
  Filled 2019-09-25 (×4): qty 1

## 2019-09-25 MED ORDER — LORATADINE 10 MG PO TABS
10.0000 mg | ORAL_TABLET | Freq: Every day | ORAL | Status: DC
  Administered 2019-09-25 – 2019-09-26 (×2): 10 mg via ORAL
  Filled 2019-09-25 (×3): qty 1

## 2019-09-25 MED ORDER — ENOXAPARIN SODIUM 40 MG/0.4ML ~~LOC~~ SOLN
40.0000 mg | SUBCUTANEOUS | Status: DC
  Administered 2019-09-25: 23:00:00 40 mg via SUBCUTANEOUS
  Filled 2019-09-25 (×2): qty 0.4

## 2019-09-25 NOTE — Telephone Encounter (Signed)
She needs to return to the ED. Outpatient follow-up is not appropriate at this time. Thank you.

## 2019-09-25 NOTE — ED Triage Notes (Signed)
Per pt, states her sodium is low-MD told her to come to ED

## 2019-09-25 NOTE — Patient Instructions (Signed)
Medication Instructions:  Your physician has recommended you make the following change in your medication:  STOP taking the following medications: 1) Prozac 2) Imodium 3) Zofran   *If you need a refill on your cardiac medications before your next appointment, please call your pharmacy*   Testing/Procedures: Your physician has requested that you have an echocardiogram. Echocardiography is a painless test that uses sound waves to create images of your heart. It provides your doctor with information about the size and shape of your heart and how well your heart's chambers and valves are working. This procedure takes approximately one hour. There are no restrictions for this procedure.  Follow-Up: At [facility], you and your health needs are our priority.  As part of our continuing mission to provide you with exceptional heart care, we have created designated Provider Care Teams.  These Care Teams include your primary Cardiologist (physician) and Advanced Practice Providers (APPs -  Physician Assistants and Nurse Practitioners) who all work together to provide you with the care you need, when you need it.  We recommend signing up for the patient portal called "MyChart".  Sign up information is provided on this After Visit Summary.  MyChart is used to connect with patients for Virtual Visits (Telemedicine).  Patients are able to view lab/test results, encounter notes, upcoming appointments, etc.  Non-urgent messages can be sent to your provider as well.   To learn more about what you can do with MyChart, go to [email or web address].    Your next appointment:   4 week(s)  The format for your next appointment:   In Person  Provider:   Melina Copa, PA-C or Ermalinda Barrios, PA-C

## 2019-09-25 NOTE — Telephone Encounter (Signed)
Dr. Tarri Glenn, do you want me to send the patient back to the ED? She is calling, demanding another appointment before 3/16 and states she is in need of another paracentesis and has gained 13 lbs (weight herself this morning). I did not know if just scheduling her for another appointment was appropriate or if I needed to send her back to the ED. Please advise?

## 2019-09-25 NOTE — Telephone Encounter (Signed)
Pt stated that she is returning your call.  °

## 2019-09-25 NOTE — H&P (Signed)
History and Physical    Kara Torres [id number] DOB: July 09, 1971 DOA: 09/25/2019  I have briefly reviewed the patient's prior medical records in [place]  PCP: Azzie Glatter, FNP  Patient coming from: home  Chief Complaint: "I was told to come back"  HPI: Kara Torres is a 49 y.o. 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 Tarri Glenn for hyponatremia.  She does have a degree of chronic hyponatremia with sodium of around 128.  She was admitted to the hospital with plans for gastroenterology to see patient the next day, with concerns for needing for large-volume paracentesis and adjustment of her home diuretics and close monitoring of her sodium levels as well as her kidney function.  Shortly after admission, before could be seen the next morning patient left AMA.  She called outpatient GI to see whether her work-up can be done as an outpatient, and was redirected to come to the emergency room.  Evaluated patient in the ER, currently she is asymptomatic, she has no chest pain, no shortness of breath, no abdominal pain, no nausea vomiting.  She reports constipation, and is taking some laxative and now is starting to work and had a bowel movement in the ER.  Since her last paracentesis 9 days ago last Wednesday, she tells me she has gained about 13 pounds but denies significant lower extremity swelling and tells me it is all in her belly.  ED Course: In the ED she is afebrile, vital signs are otherwise stable, blood work shows a sodium of 126 from 123 2 days ago, creatinine 1.36 from 1.213 days ago, potassium 3.2.  Her albumin is 2.6.  Total bilirubin 1.4.  CBC with normal platelets at  211.  Her INR is 1.3.  SARS-CoV-2 pending during this admission, the one done on 3/3 is negative  Review of Systems: All systems reviewed, and apart from HPI, all negative  Past Medical History:  Diagnosis Date  . Alcohol abuse 06/19/2019  . Allergy   . Alpha-1-antitrypsin deficiency ([place])   . Anxiety   . Ascites due to alcoholic cirrhosis ([place])   . Asthma   . Cataract of both eyes 06/19/2019  . Cataracts, bilateral   . Chronic gastritis without bleeding 06/19/2019  . Chronic seasonal allergic rhinitis due to pollen 10/17/2018  . Daytime sleepiness 04/28/2017  . Depression   . Diarrhea 10/17/2018  . Dyslipidemia 07/13/2016  . Elevated serum creatinine 08/2019  . Esophageal varices ([place])   . Esophageal varices in alcoholic cirrhosis ([place]) [id number]  . Essential hypertension 07/13/2016  . Eye exam abnormal 12/10/2018  . Fatty liver disease, nonalcoholic 0000000  . Generalized anxiety disorder 07/13/2016  . History of cholecystectomy   . Hypocalcemia 08/2019  . Hyponatremia 08/2019  . Liver disease   . Nausea and vomiting 10/17/2018  . Obesity 07/13/2016  . Visual disturbance 12/10/2018  . Vitamin D deficiency 06/2019    Past Surgical History:  Procedure Laterality Date  . CHOLECYSTECTOMY  2000  . IR PARACENTESIS  08/20/2019  . IR PARACENTESIS  09/09/2019     reports that she has quit smoking. Her smoking use included cigarettes. She has never used smokeless tobacco. She reports previous alcohol use of about 2.0 standard  drinks of alcohol per week. She reports current drug use. Drug: Marijuana.  Allergies  Allergen Reactions  . Epinephrine   . Hydrocodone Rash    Family History  Problem Relation Age of Onset  . Stroke Mother   . Hyperlipidemia Mother   . Diabetes Mother   . Diabetes Father   . Hypertension Father   . Hyperlipidemia Father   . Mental illness Father   . Stroke Father   . Diabetes Maternal Grandmother   . Heart disease Maternal Grandmother   .  Hypertension Maternal Grandmother   . Heart attack Maternal Grandmother   . Colon cancer Other        multiple great aunts, great uncles  . Esophageal cancer Neg Hx   . Stomach cancer Neg Hx     Prior to Admission medications   Medication Sig Start Date End Date Taking? Authorizing Provider  atorvastatin (LIPITOR) 40 MG tablet Take 1 tablet (40 mg total) by mouth daily. 06/26/19  Yes Azzie Glatter, FNP  cetirizine (ZYRTEC) 10 MG tablet Take 1 tablet (10 mg total) by mouth daily. Patient taking differently: Take 10 mg by mouth at bedtime.  06/26/19  Yes Azzie Glatter, FNP  CHERRY PO Take 1 tablet by mouth daily.   Yes [provider]  hydrOXYzine (ATARAX/VISTARIL) 50 MG tablet Take 50-150 mg by mouth 3 (three) times daily as needed for anxiety or itching.    Yes [provider]  Lactulose 20 GM/30ML SOLN Take 30 mLs (20 g total) by mouth 3 (three) times daily. 09/11/19  Yes Thornton Park, MD  pantoprazole (PROTONIX) 40 MG tablet Take 1 tablet (40 mg total) by mouth 2 (two) times daily before a meal. Patient taking differently: Take 40 mg by mouth 2 (two) times daily.  08/25/19  Yes Levin Erp, PA  spironolactone (ALDACTONE) 25 MG tablet Take 1 tablet (25 mg total) by mouth daily. 09/18/19 10/18/19 Yes Thornton Park, MD  Vitamin D, Ergocalciferol, (DRISDOL) 1.25 MG (50000 UNIT) CAPS capsule Take 1 capsule (50,000 Units total) by mouth every 7 (seven) days. 09/15/19  Yes Azzie Glatter, FNP  ferrous sulfate (FERROUSUL) 325 (65 FE) MG tablet Take 1 tablet (325 mg total) by mouth every other day. Patient not taking: Reported on 09/25/2019 08/12/19   Levin Erp, [place]    Physical Exam: Vitals:   09/25/19 1545 09/25/19 1700 09/25/19 1715  BP: 109/76 112/77 125/85  Pulse: 92 91 88  Resp: 16 17 16   Temp: 97.7 F (36.5 C)    TempSrc: Oral    SpO2: 100% 99% 100%      Constitutional: NAD, calm, comfortable Eyes: PERRL, lids and conjunctivae  normal ENMT: Mucous membranes are moist.  Neck: normal, supple Respiratory: clear to auscultation bilaterally, no wheezing, no crackles. Normal respiratory effort. No accessory muscle use.  Cardiovascular: Regular rate and rhythm, no murmurs / rubs / gallops.  Trace lower extremity edema. 2+ pedal pulses.  Abdomen: Distended, ascites present.  Nontender to palpation.  Distant bowel sounds Musculoskeletal: no clubbing / cyanosis. Normal muscle tone.  Skin: no rashes, lesions, ulcers. No induration Neurologic: CN 2-12 grossly intact. Strength 5/5 in all 4.  Psychiatric: Normal judgment and insight. Alert and oriented x 3. Normal mood.   Labs on Admission: I have personally reviewed following labs and imaging studies  CBC: Recent Labs  Lab 09/23/19 1603 09/25/19 1600  WBC 7.4 6.3  NEUTROABS 4.3 4.1  HGB 10.0* 10.0*  HCT 30.6* 31.1*  MCV 86.4 88.6  PLT 206 [id number]   Basic Metabolic Panel: Recent Labs  Lab 09/22/19 1515 09/23/19 1603 09/25/19 1600  NA 122* 123* 126*  K 3.6 3.7 3.2*  CL 89* 90* 93*  CO2 26 22 23   GLUCOSE 94 63* 94  BUN 18 19 19   CREATININE 1.20 1.21* 1.36*  CALCIUM 8.6 8.1* 8.3*  MG  --   --  2.2   Liver Function Tests: Recent Labs  Lab 09/23/19 1603 09/25/19 1600  AST 67* 69*  ALT 31 36  ALKPHOS 135* 135*  BILITOT 1.6* 1.4*  PROT 6.7 7.0  ALBUMIN 2.3* 2.6*   Coagulation Profile: Recent Labs  Lab 09/25/19 1600  INR 1.3*   BNP (last 3 results) No results for input(s): PROBNP in the last 8760 hours. CBG: Recent Labs  Lab 09/23/19 1838 09/23/19 2055  GLUCAP 67* 116*   Thyroid Function Tests: No results for input(s): TSH, T4TOTAL, FREET4, T3FREE, THYROIDAB in the last 72 hours. Urine analysis:    Component Value Date/Time   COLORURINE YELLOW 09/03/2019 1637   APPEARANCEUR CLEAR 09/03/2019 1637   LABSPEC 1.003 (L) 09/03/2019 1637   PHURINE 5.0 09/03/2019 1637   GLUCOSEU NEGATIVE 09/03/2019 1637   HGBUR NEGATIVE 09/03/2019 1637    BILIRUBINUR Moderate 09/15/2019 1004   KETONESUR NEGATIVE 09/03/2019 1637   PROTEINUR Positive (A) 09/15/2019 1004   PROTEINUR NEGATIVE 09/03/2019 1637   UROBILINOGEN 1.0 09/15/2019 1004   UROBILINOGEN 0.2 10/18/2017 1536   NITRITE Negative 09/15/2019 1004   NITRITE NEGATIVE 09/03/2019 1637   LEUKOCYTESUR Negative 09/15/2019 1004   LEUKOCYTESUR NEGATIVE 09/03/2019 1637     Radiological Exams on Admission: No results found.   Assessment/Plan  Principal Problem Acute on chronic hyponatremia -This is likely in the setting of liver disease along with third spacing with ascites, baseline sodium of 125-128 -Current sodium 126 -She will need optimization of her diuretics regimen, currently she is only taking spironolactone, defer to cardiology whether Lasix needs to be added. -She was taking Prozac but this was just recently discontinued which may contribute -Hold off further diuretics and IV fluids, monitor -For completeness we will check a TSH, urine osmolality, serum osmolality, urine sodium  Active Problems Decompensated cirrhosis with recurrent ascites requiring LVP -Clearly a problem as it confers a poor prognosis, GI consulted -She will likely need paracentesis as she is quite distended, probably need to limit fluid removal to no more than 5 L followed by albumin, however defer final decision to GI -Continue PPI  Acute kidney injury versus early chronic kidney disease stage IIIa -Over the last 30 days patient's creatinine has slightly increased ranging between 1.1-1.5, may have developing a degree of chronic kidney disease -Given recurrent large volume paracentesis this is concerning, may evolve into hepatorenal syndrome, check urine sodium  GAD -She was recently taken off of Prozac, continue hydroxyzine   DVT prophylaxis: Lovenox  Code Status: Full code  Family Communication: d/w patient Disposition Plan: home when ready  Bed Type: Medsurg Consults called: GI    Obs/Inp: Observation  Marzetta Board, MD, PhD [facility]  Contact via [email or web address]  09/25/2019, 6:01 PM

## 2019-09-25 NOTE — ED Notes (Signed)
I called patient back for Triage and patient is in the restroom

## 2019-09-25 NOTE — ED Provider Notes (Signed)
[facility] DEPT Provider Note   CSN: [id number] Arrival date & time: 09/25/19  1525     History Chief Complaint  Patient presents with  . Abnormal Lab    Kara Torres is a 49 y.o. female who presents emergency department for evaluation of hyponatremia.  She has a past medical history of recurrent ascites due to alcoholic cirrhosis, esophageal varices, chronic nausea and recurrent episodes of hyponatremia.  Patient was admitted to the hospitalist service 2 days ago however left AMA.  She states "I was very itchy and very irritable and I kept asking for my Zyrtec as I just would give it to me so I left.  Patient states that today her GI specialist called her "pissed" and told her to come back to the emergency department.  She states that she feels tired and dizzy but she always feels tired and dizzy and this is no different than usual for her.  She was seen today by Golden Hurter and was found to have prolonged QT on her EKG and was advised to discontinue using her Zofran and omeprazole.  She is also supposed to follow-up with her PCP to potentially transition off of her fluoxetine.  Patient has no new complaints at this time.  HPI     Past Medical History:  Diagnosis Date  . Alcohol abuse 06/19/2019  . Allergy   . Alpha-1-antitrypsin deficiency ([place])   . Anxiety   . Ascites due to alcoholic cirrhosis ([place])   . Asthma   . Cataract of both eyes 06/19/2019  . Cataracts, bilateral   . Chronic gastritis without bleeding 06/19/2019  . Chronic seasonal allergic rhinitis due to pollen 10/17/2018  . Daytime sleepiness 04/28/2017  . Depression   . Diarrhea 10/17/2018  . Dyslipidemia 07/13/2016  . Elevated serum creatinine 08/2019  . Esophageal varices ([place])   . Esophageal varices in alcoholic cirrhosis ([place]) [id number]  . Essential hypertension 07/13/2016  . Eye exam abnormal 12/10/2018  . Fatty liver disease, nonalcoholic 0000000  . Generalized  anxiety disorder 07/13/2016  . History of cholecystectomy   . Hypocalcemia 08/2019  . Hyponatremia 08/2019  . Liver disease   . Nausea and vomiting 10/17/2018  . Obesity 07/13/2016  . Visual disturbance 12/10/2018  . Vitamin D deficiency 06/2019    Patient Active Problem List   Diagnosis Date Noted  . GERD (gastroesophageal reflux disease) 09/24/2019  . Depression 09/24/2019  . Hyponatremia 09/23/2019  . Cataract of both eyes 06/19/2019  . Chronic gastritis without bleeding 06/19/2019  . Esophageal varices in alcoholic cirrhosis ([place]) [id number]  . Alcohol abuse 06/19/2019  . Visual disturbance 12/10/2018  . Eye exam abnormal 12/10/2018  . Nausea and vomiting 10/17/2018  . Abdominal pain, bilateral upper quadrant 10/17/2018  . Diarrhea 10/17/2018  . Chronic seasonal allergic rhinitis due to pollen 10/17/2018  . Daytime sleepiness 04/28/2017  . Essential hypertension 07/13/2016  . Obesity 07/13/2016  . Dyslipidemia 07/13/2016  . Fatty liver disease, nonalcoholic 0000000  . Generalized anxiety disorder 07/13/2016    Past Surgical History:  Procedure Laterality Date  . CHOLECYSTECTOMY  2000  . IR PARACENTESIS  08/20/2019  . IR PARACENTESIS  09/09/2019     OB History    Gravida  0   Para  0   Term  0   Preterm  0   AB  0   Living  0     SAB  0   TAB  0   Ectopic  0  Multiple  0   Live Births  0           Family History  Problem Relation Age of Onset  . Stroke Mother   . Hyperlipidemia Mother   . Diabetes Mother   . Diabetes Father   . Hypertension Father   . Hyperlipidemia Father   . Mental illness Father   . Stroke Father   . Diabetes Maternal Grandmother   . Heart disease Maternal Grandmother   . Hypertension Maternal Grandmother   . Heart attack Maternal Grandmother   . Colon cancer Other        multiple great aunts, great uncles  . Esophageal cancer Neg Hx   . Stomach cancer Neg Hx     Social History   Tobacco Use  . Smoking  status: Former Smoker    Types: Cigarettes  . Smokeless tobacco: Never Used  Substance Use Topics  . Alcohol use: Not Currently    Alcohol/week: 2.0 standard drinks    Types: 2 Cans of beer per week  . Drug use: Yes    Types: Marijuana    Comment: Marijuana edibles    Home Medications Prior to Admission medications   Medication Sig Start Date End Date Taking? Authorizing Provider  atorvastatin (LIPITOR) 40 MG tablet Take 1 tablet (40 mg total) by mouth daily. 06/26/19  Yes Azzie Glatter, FNP  cetirizine (ZYRTEC) 10 MG tablet Take 1 tablet (10 mg total) by mouth daily. Patient taking differently: Take 10 mg by mouth at bedtime.  06/26/19  Yes Azzie Glatter, FNP  CHERRY PO Take 1 tablet by mouth daily.   Yes [provider]  hydrOXYzine (ATARAX/VISTARIL) 50 MG tablet Take 50-150 mg by mouth 3 (three) times daily as needed for anxiety or itching.    Yes [provider]  Lactulose 20 GM/30ML SOLN Take 30 mLs (20 g total) by mouth 3 (three) times daily. 09/11/19  Yes Thornton Park, MD  pantoprazole (PROTONIX) 40 MG tablet Take 1 tablet (40 mg total) by mouth 2 (two) times daily before a meal. Patient taking differently: Take 40 mg by mouth 2 (two) times daily.  08/25/19  Yes Levin Erp, PA  spironolactone (ALDACTONE) 25 MG tablet Take 1 tablet (25 mg total) by mouth daily. 09/18/19 10/18/19 Yes Thornton Park, MD  Vitamin D, Ergocalciferol, (DRISDOL) 1.25 MG (50000 UNIT) CAPS capsule Take 1 capsule (50,000 Units total) by mouth every 7 (seven) days. 09/15/19  Yes Azzie Glatter, FNP  ferrous sulfate (FERROUSUL) 325 (65 FE) MG tablet Take 1 tablet (325 mg total) by mouth every other day. Patient not taking: Reported on 09/25/2019 08/12/19   Levin Erp, PA    Allergies    Epinephrine and Hydrocodone  Review of Systems   Review of Systems Ten systems reviewed and are negative for acute change, except as noted in the HPI.   Physical  Exam Updated Vital Signs BP 95/61 (BP Location: Left Arm)   Pulse 93   Temp 98.2 F (36.8 C) (Oral)   Resp 16   SpO2 97%   Physical Exam Vitals and nursing note reviewed.  Constitutional:      General: She is not in acute distress.    Appearance: She is well-developed. She is not diaphoretic.  HENT:     Head: Normocephalic and atraumatic.  Eyes:     General: No scleral icterus.    Conjunctiva/sclera: Conjunctivae normal.  Cardiovascular:     Rate and Rhythm: Normal rate  and regular rhythm.     Heart sounds: Normal heart sounds. No murmur. No friction rub. No gallop.   Pulmonary:     Effort: Pulmonary effort is normal. No respiratory distress.     Breath sounds: Normal breath sounds.  Abdominal:     General: Bowel sounds are normal. There is distension.     Palpations: Abdomen is soft. There is no mass.     Tenderness: There is no abdominal tenderness. There is no guarding.  Musculoskeletal:     Cervical back: Normal range of motion.  Skin:    General: Skin is warm and dry.  Neurological:     Mental Status: She is alert and oriented to person, place, and time.  Psychiatric:        Behavior: Behavior normal.     ED Results / Procedures / Treatments   Labs (all labs ordered are listed, but only abnormal results are displayed) Labs Reviewed  COMPREHENSIVE METABOLIC PANEL - Abnormal; Notable for the following components:      Result Value   Sodium 126 (*)    Potassium 3.2 (*)    Chloride 93 (*)    Creatinine, Ser 1.36 (*)    Calcium 8.3 (*)    Albumin 2.6 (*)    AST 69 (*)    Alkaline Phosphatase 135 (*)    Total Bilirubin 1.4 (*)    GFR calc non Af Amer 47 (*)    GFR calc Af Amer 54 (*)    All other components within normal limits  CBC WITH DIFFERENTIAL/PLATELET - Abnormal; Notable for the following components:   RBC 3.51 (*)    Hemoglobin 10.0 (*)    HCT 31.1 (*)    RDW 18.6 (*)    All other components within normal limits  PROTIME-INR - Abnormal; Notable  for the following components:   Prothrombin Time 16.4 (*)    INR 1.3 (*)    All other components within normal limits  COMPREHENSIVE METABOLIC PANEL - Abnormal; Notable for the following components:   Sodium 127 (*)    Potassium 3.1 (*)    Chloride 96 (*)    Glucose, Bld 102 (*)    Creatinine, Ser 1.25 (*)    Calcium 8.1 (*)    Total Protein 5.8 (*)    Albumin 2.2 (*)    AST 57 (*)    Total Bilirubin 1.5 (*)    GFR calc non Af Amer 52 (*)    GFR calc Af Amer 60 (*)    All other components within normal limits  CBC - Abnormal; Notable for the following components:   RBC 3.00 (*)    Hemoglobin 8.7 (*)    HCT 26.2 (*)    RDW 18.6 (*)    All other components within normal limits  PROTIME-INR - Abnormal; Notable for the following components:   Prothrombin Time 17.1 (*)    INR 1.4 (*)    All other components within normal limits  OSMOLALITY - Abnormal; Notable for the following components:   Osmolality 269 (*)    All other components within normal limits  SARS CORONAVIRUS 2 (TAT 6-24 HRS)  MAGNESIUM  AMMONIA  HIV ANTIBODY (ROUTINE TESTING W REFLEX)  TSH  SODIUM, URINE, RANDOM  OSMOLALITY, URINE    EKG EKG Interpretation  Date/Time:  Friday September 25 2019 16:24:14 EST Ventricular Rate:  91 PR Interval:    QRS Duration: 95 QT Interval:  379 QTC Calculation: 467 R Axis:   25  Text Interpretation: Sinus rhythm Low voltage, precordial leads Borderline T abnormalities, anterior leads Confirmed by Milton Ferguson [phone]) on 09/25/2019 5:41:29 PM   Radiology No results found.  Procedures Procedures (including critical care time)  Medications Ordered in ED Medications  enoxaparin (LOVENOX) injection 40 mg (40 mg Subcutaneous Given 09/25/19 2256)  atorvastatin (LIPITOR) tablet 40 mg (40 mg Oral Given 09/25/19 2252)  hydrOXYzine (ATARAX/VISTARIL) tablet 50-150 mg (150 mg Oral Given 09/25/19 2252)  pantoprazole (PROTONIX) EC tablet 40 mg (40 mg Oral Given 09/26/19 1014)  loratadine  (CLARITIN) tablet 10 mg (10 mg Oral Given 09/25/19 2252)  albumin human 25 % solution 25 g (has no administration in time range)  FLUoxetine (PROZAC) capsule 10 mg (has no administration in time range)  lidocaine (XYLOCAINE) 1 % (with pres) injection (has no administration in time range)    ED Course  I have reviewed the triage vital signs and the nursing notes.  Pertinent labs & imaging results that were available during my care of the patient were reviewed by me and considered in my medical decision making (see chart for details).    MDM Rules/Calculators/A&P                      Patient returns after leaving AMA from admission for Hyponatremia. Patient will be admitted with planned medications adjustments and large volume paracentesis. Labs show elevated bili and Ast, hyponatremia, hypokalemia. Anemia, elevated INR and normal ammonia level. HDS and oriented without confusion or myoclonus.         Final Clinical Impression(s) / ED Diagnoses Final diagnoses:  Hyponatremia    Rx / DC Orders ED Discharge Orders    None       Margarita Mail, PA-C 09/26/19 1300    Milton Ferguson, MD 09/28/19 [phone]

## 2019-09-25 NOTE — Telephone Encounter (Signed)
Pt calling missed appt on 09-24-19 wanting an appt before 10-06-19.  Pt states she needs paracentesis. Please advise

## 2019-09-25 NOTE — Telephone Encounter (Signed)
Patient told to return to the ED for treatment and admission.

## 2019-09-26 ENCOUNTER — Other Ambulatory Visit: Payer: Self-pay

## 2019-09-26 ENCOUNTER — Observation Stay (HOSPITAL_COMMUNITY): Payer: Medicaid Other

## 2019-09-26 DIAGNOSIS — Z823 Family history of stroke: Secondary | ICD-10-CM | POA: Diagnosis not present

## 2019-09-26 DIAGNOSIS — Z833 Family history of diabetes mellitus: Secondary | ICD-10-CM | POA: Diagnosis not present

## 2019-09-26 DIAGNOSIS — K767 Hepatorenal syndrome: Secondary | ICD-10-CM | POA: Diagnosis present

## 2019-09-26 DIAGNOSIS — E871 Hypo-osmolality and hyponatremia: Secondary | ICD-10-CM | POA: Diagnosis present

## 2019-09-26 DIAGNOSIS — R188 Other ascites: Secondary | ICD-10-CM | POA: Diagnosis not present

## 2019-09-26 DIAGNOSIS — D500 Iron deficiency anemia secondary to blood loss (chronic): Secondary | ICD-10-CM | POA: Diagnosis not present

## 2019-09-26 DIAGNOSIS — E785 Hyperlipidemia, unspecified: Secondary | ICD-10-CM | POA: Diagnosis present

## 2019-09-26 DIAGNOSIS — F411 Generalized anxiety disorder: Secondary | ICD-10-CM | POA: Diagnosis present

## 2019-09-26 DIAGNOSIS — Z885 Allergy status to narcotic agent status: Secondary | ICD-10-CM | POA: Diagnosis not present

## 2019-09-26 DIAGNOSIS — K219 Gastro-esophageal reflux disease without esophagitis: Secondary | ICD-10-CM | POA: Diagnosis present

## 2019-09-26 DIAGNOSIS — K7460 Unspecified cirrhosis of liver: Secondary | ICD-10-CM

## 2019-09-26 DIAGNOSIS — N183 Chronic kidney disease, stage 3 unspecified: Secondary | ICD-10-CM | POA: Diagnosis present

## 2019-09-26 DIAGNOSIS — F329 Major depressive disorder, single episode, unspecified: Secondary | ICD-10-CM | POA: Diagnosis present

## 2019-09-26 DIAGNOSIS — E876 Hypokalemia: Secondary | ICD-10-CM | POA: Diagnosis present

## 2019-09-26 DIAGNOSIS — Z888 Allergy status to other drugs, medicaments and biological substances status: Secondary | ICD-10-CM | POA: Diagnosis not present

## 2019-09-26 DIAGNOSIS — K7031 Alcoholic cirrhosis of liver with ascites: Secondary | ICD-10-CM | POA: Diagnosis not present

## 2019-09-26 DIAGNOSIS — Z20822 Contact with and (suspected) exposure to covid-19: Secondary | ICD-10-CM | POA: Diagnosis present

## 2019-09-26 DIAGNOSIS — I129 Hypertensive chronic kidney disease with stage 1 through stage 4 chronic kidney disease, or unspecified chronic kidney disease: Secondary | ICD-10-CM | POA: Diagnosis present

## 2019-09-26 DIAGNOSIS — Z8349 Family history of other endocrine, nutritional and metabolic diseases: Secondary | ICD-10-CM | POA: Diagnosis not present

## 2019-09-26 DIAGNOSIS — D631 Anemia in chronic kidney disease: Secondary | ICD-10-CM | POA: Diagnosis present

## 2019-09-26 DIAGNOSIS — Z87891 Personal history of nicotine dependence: Secondary | ICD-10-CM | POA: Diagnosis not present

## 2019-09-26 DIAGNOSIS — Z818 Family history of other mental and behavioral disorders: Secondary | ICD-10-CM | POA: Diagnosis not present

## 2019-09-26 DIAGNOSIS — K7011 Alcoholic hepatitis with ascites: Secondary | ICD-10-CM | POA: Diagnosis not present

## 2019-09-26 DIAGNOSIS — N179 Acute kidney failure, unspecified: Secondary | ICD-10-CM | POA: Diagnosis present

## 2019-09-26 DIAGNOSIS — I8510 Secondary esophageal varices without bleeding: Secondary | ICD-10-CM | POA: Diagnosis present

## 2019-09-26 DIAGNOSIS — E8801 Alpha-1-antitrypsin deficiency: Secondary | ICD-10-CM | POA: Diagnosis present

## 2019-09-26 DIAGNOSIS — K766 Portal hypertension: Secondary | ICD-10-CM | POA: Diagnosis present

## 2019-09-26 DIAGNOSIS — Z8249 Family history of ischemic heart disease and other diseases of the circulatory system: Secondary | ICD-10-CM | POA: Diagnosis not present

## 2019-09-26 DIAGNOSIS — N1831 Chronic kidney disease, stage 3a: Secondary | ICD-10-CM | POA: Diagnosis not present

## 2019-09-26 LAB — HIV ANTIBODY (ROUTINE TESTING W REFLEX): HIV Screen 4th Generation wRfx: NONREACTIVE

## 2019-09-26 LAB — BODY FLUID CELL COUNT WITH DIFFERENTIAL
Eos, Fluid: 0 %
Lymphs, Fluid: 45 %
Monocyte-Macrophage-Serous Fluid: 51 % (ref 50–90)
Neutrophil Count, Fluid: 4 % (ref 0–25)
Total Nucleated Cell Count, Fluid: 79 cu mm (ref 0–1000)

## 2019-09-26 LAB — PROTIME-INR
INR: 1.4 — ABNORMAL HIGH (ref 0.8–1.2)
Prothrombin Time: 17.1 seconds — ABNORMAL HIGH (ref 11.4–15.2)

## 2019-09-26 LAB — CBC
HCT: 26.2 % — ABNORMAL LOW (ref 36.0–46.0)
Hemoglobin: 8.7 g/dL — ABNORMAL LOW (ref 12.0–15.0)
MCH: 29 pg (ref 26.0–34.0)
MCHC: 33.2 g/dL (ref 30.0–36.0)
MCV: 87.3 fL (ref 80.0–100.0)
Platelets: 166 10*3/uL (ref 150–400)
RBC: 3 MIL/uL — ABNORMAL LOW (ref 3.87–5.11)
RDW: 18.6 % — ABNORMAL HIGH (ref 11.5–15.5)
WBC: 5.4 10*3/uL (ref 4.0–10.5)
nRBC: 0 % (ref 0.0–0.2)

## 2019-09-26 LAB — COMPREHENSIVE METABOLIC PANEL
ALT: 30 U/L (ref 0–44)
AST: 57 U/L — ABNORMAL HIGH (ref 15–41)
Albumin: 2.2 g/dL — ABNORMAL LOW (ref 3.5–5.0)
Alkaline Phosphatase: 121 U/L (ref 38–126)
Anion gap: 7 (ref 5–15)
BUN: 18 mg/dL (ref 6–20)
CO2: 24 mmol/L (ref 22–32)
Calcium: 8.1 mg/dL — ABNORMAL LOW (ref 8.9–10.3)
Chloride: 96 mmol/L — ABNORMAL LOW (ref 98–111)
Creatinine, Ser: 1.25 mg/dL — ABNORMAL HIGH (ref 0.44–1.00)
GFR calc Af Amer: 60 mL/min — ABNORMAL LOW (ref >60)
GFR calc non Af Amer: 52 mL/min — ABNORMAL LOW (ref >60)
Glucose, Bld: 102 mg/dL — ABNORMAL HIGH (ref 70–99)
Potassium: 3.1 mmol/L — ABNORMAL LOW (ref 3.5–5.1)
Sodium: 127 mmol/L — ABNORMAL LOW (ref 135–145)
Total Bilirubin: 1.5 mg/dL — ABNORMAL HIGH (ref 0.3–1.2)
Total Protein: 5.8 g/dL — ABNORMAL LOW (ref 6.5–8.1)

## 2019-09-26 LAB — TSH: TSH: 2.175 u[IU]/mL (ref 0.350–4.500)

## 2019-09-26 LAB — OSMOLALITY, URINE: Osmolality, Ur: 284 mOsm/kg — ABNORMAL LOW (ref 300–900)

## 2019-09-26 LAB — OSMOLALITY: Osmolality: 269 mOsm/kg — ABNORMAL LOW (ref 275–295)

## 2019-09-26 LAB — SARS CORONAVIRUS 2 (TAT 6-24 HRS): SARS Coronavirus 2: NEGATIVE

## 2019-09-26 LAB — SODIUM, URINE, RANDOM: Sodium, Ur: <10 mmol/L

## 2019-09-26 MED ORDER — ALBUMIN HUMAN 25 % IV SOLN
25.0000 g | Freq: Four times a day (QID) | INTRAVENOUS | Status: AC
  Administered 2019-09-26 – 2019-09-27 (×3): 25 g via INTRAVENOUS
  Filled 2019-09-26 (×4): qty 100

## 2019-09-26 MED ORDER — TRAMADOL HCL 50 MG PO TABS
50.0000 mg | ORAL_TABLET | Freq: Once | ORAL | Status: AC
  Administered 2019-09-26: 20:00:00 50 mg via ORAL
  Filled 2019-09-26: qty 1

## 2019-09-26 MED ORDER — LIDOCAINE HCL 1 % IJ SOLN
INTRAMUSCULAR | Status: AC
  Filled 2019-09-26: qty 20

## 2019-09-26 MED ORDER — FLUOXETINE HCL 10 MG PO CAPS
10.0000 mg | ORAL_CAPSULE | Freq: Every day | ORAL | Status: DC
  Administered 2019-09-26 – 2019-09-27 (×2): 10 mg via ORAL
  Filled 2019-09-26 (×3): qty 1

## 2019-09-26 NOTE — Progress Notes (Signed)
PROGRESS NOTE  Kara Torres HU:[id number] DOB: [birth year] DOA: 09/25/2019 PCP: Azzie Glatter, FNP   LOS: 0 days   Brief Narrative / Interim history: 49 year old female with alcohol induced liver cirrhosis, quit alcohol in 2020, esophageal varices, recurrent ascites, chronic hyponatremia who comes into the hospital with acute on chronic hyponatremia, abdominal distention and mild creatinine elevation.  Also reports a history of nausea and vomiting for the past year and a half.  Subjective / 24h Interval events: She is doing well this morning, no chest pain, shortness of breath  Assessment & Plan: Principal Problem Acute on chronic hyponatremia -Multifactorial in the setting of liver disease along with third spacing with ascites, baseline sodium 125-128, sodium on admission was 126, improved without intervention 227 this morning. -She needs a large volume paracentesis, adjustment of her diuretics, GI is seeing patient, appreciate input  Active Problems Decompensated cirrhosis with recurrent ascites requiring LVP -Ultrasound-guided paracentesis today, followed by albumin -Monitor closely monitor renal function following the paracentesis and adjustment of her diuretics per GI  Acute kidney injury versus part of chronic disease stage IIIa -Over the last 30 days, patient's creatinine has slightly increased ranging between 1.1-1.5, she may be developing a degree of chronic kidney disease. -She was on Lasix and spironolactone but at that time her creatinine went up to 1.5 and her Lasix has been discontinued for the past week.  She currently is on spironolactone alone -Defer to GI further adjustments of her diuretics  GAD -She was on Prozac, this was recently discontinued a few days ago but patient feels like, I will resume at a lower dose as a taper and this can be discussed further as an outpatient   Scheduled Meds: . atorvastatin  40 mg Oral Daily  . enoxaparin (LOVENOX)  injection  40 mg Subcutaneous Q24H  . loratadine  10 mg Oral Daily  . pantoprazole  40 mg Oral BID   Continuous Infusions: . albumin human     PRN Meds:.hydrOXYzine  DVT prophylaxis: Lovenox Code Status: Full code Family Communication: Discussed with patient Patient admitted from: Home Anticipated d/c place: Home Barriers to d/c: Ongoing gastroenterology evaluation, ultrasound-guided paracentesis pending monitoring of her sodium levels and kidney function  Consultants:  Gastroenterology  Procedures:  None   Microbiology  None   Antimicrobials: None     Objective: Vitals:   09/25/19 1715 09/25/19 1923 09/25/19 2104 09/26/19 0556  BP: 125/85 112/81 (!) 83/58 95/61  Pulse: 88 93 85 93  Resp: 16 20 18 16   Temp:   (!) 97.4 F (36.3 C) 98.2 F (36.8 C)  TempSrc:   Oral Oral  SpO2: 100% 100% 98% 97%   No intake or output data in the 24 hours ending 09/26/19 1035 There were no vitals filed for this visit.  Examination:  Constitutional: NAD Eyes: no scleral icterus ENMT: Mucous membranes are moist.  Neck: normal, supple Respiratory: clear to auscultation bilaterally, no wheezing, no crackles. Normal respiratory effort.  Cardiovascular: Regular rate and rhythm, no murmurs / rubs / gallops. No LE edema. Good peripheral pulses Abdomen: distended, BS+, non tender Musculoskeletal: no clubbing / cyanosis.  Skin: no rashes Neurologic: Nonfocal  Data Reviewed: I have independently reviewed following labs and imaging studies   CBC: Recent Labs  Lab 09/23/19 1603 09/25/19 1600 09/26/19 0629  WBC 7.4 6.3 5.4  NEUTROABS 4.3 4.1  --   HGB 10.0* 10.0* 8.7*  HCT 30.6* 31.1* 26.2*  MCV 86.4 88.6 87.3  PLT 206 211 166  Basic Metabolic Panel: Recent Labs  Lab 09/22/19 1515 09/23/19 1603 09/25/19 1600 09/26/19 0629  NA 122* 123* 126* 127*  K 3.6 3.7 3.2* 3.1*  CL 89* 90* 93* 96*  CO2 26 22 23 24   GLUCOSE 94 63* 94 102*  BUN 18 19 19 18   CREATININE 1.20 1.21*  1.36* 1.25*  CALCIUM 8.6 8.1* 8.3* 8.1*  MG  --   --  2.2  --    Liver Function Tests: Recent Labs  Lab 09/23/19 1603 09/25/19 1600 09/26/19 0629  AST 67* 69* 57*  ALT 31 36 30  ALKPHOS 135* 135* 121  BILITOT 1.6* 1.4* 1.5*  PROT 6.7 7.0 5.8*  ALBUMIN 2.3* 2.6* 2.2*   Coagulation Profile: Recent Labs  Lab 09/25/19 1600 09/26/19 0629  INR 1.3* 1.4*   HbA1C: No results for input(s): HGBA1C in the last 72 hours. CBG: Recent Labs  Lab 09/23/19 1838 09/23/19 2055  GLUCAP 67* 116*    Recent Results (from the past 240 hour(s))  SARS CORONAVIRUS 2 (TAT 6-24 HRS) Nasopharyngeal Nasopharyngeal Swab     Status: None   Collection Time: 09/23/19  5:02 PM   Specimen: Nasopharyngeal Swab  Result Value Ref Range Status   SARS Coronavirus 2 NEGATIVE NEGATIVE Final    Comment: (NOTE) SARS-CoV-2 target nucleic acids are NOT DETECTED. The SARS-CoV-2 RNA is generally detectable in upper and lower respiratory specimens during the acute phase of infection. Negative results do not preclude SARS-CoV-2 infection, do not rule out co-infections with other pathogens, and should not be used as the sole basis for treatment or other patient management decisions. Negative results must be combined with clinical observations, patient history, and epidemiological information. The expected result is Negative. Fact Sheet for Patients: SugarRoll.be Fact Sheet for Healthcare Providers: https://www.woods-mathews.com/ This test is not yet approved or cleared by the [place] FDA and  has been authorized for detection and/or diagnosis of SARS-CoV-2 by FDA under an Emergency Use Authorization (EUA). This EUA will remain  in effect (meaning this test can be used) for the duration of the COVID-19 declaration under Section 56 4(b)(1) of the Act, 21 U.S.C. section 360bbb-3(b)(1), unless the authorization is terminated or revoked sooner. Performed at [facility], [place] [address], [place], [place] [address]   SARS CORONAVIRUS 2 (TAT 6-24 HRS) Nasopharyngeal Nasopharyngeal Swab     Status: None   Collection Time: 09/25/19  6:01 PM   Specimen: Nasopharyngeal Swab  Result Value Ref Range Status   SARS Coronavirus 2 NEGATIVE NEGATIVE Final    Comment: (NOTE) SARS-CoV-2 target nucleic acids are NOT DETECTED. The SARS-CoV-2 RNA is generally detectable in upper and lower respiratory specimens during the acute phase of infection. Negative results do not preclude SARS-CoV-2 infection, do not rule out co-infections with other pathogens, and should not be used as the sole basis for treatment or other patient management decisions. Negative results must be combined with clinical observations, patient history, and epidemiological information. The expected result is Negative. Fact Sheet for Patients: SugarRoll.be Fact Sheet for Healthcare Providers: https://www.woods-mathews.com/ This test is not yet approved or cleared by the [place] FDA and  has been authorized for detection and/or diagnosis of SARS-CoV-2 by FDA under an Emergency Use Authorization (EUA). This EUA will remain  in effect (meaning this test can be used) for the duration of the COVID-19 declaration under Section 56 4(b)(1) of the Act, 21 U.S.C. section 360bbb-3(b)(1), unless the authorization is terminated or revoked sooner. Performed at [facility], [place] [address] [place], [place] [address]      Radiology Studies: No results found.  Marzetta Board, MD, PhD [facility]  Between 7 am - 7 pm I am available, please contact me via Amion or Securechat  Between 7 pm - 7 am I am not available, please contact night coverage MD/APP via Amion

## 2019-09-26 NOTE — Consult Note (Addendum)
[facility] Consult: 9:17 AM 09/26/2019  LOS: 0 days    Referring Provider: Dr Cruzita Lederer  Primary Care Physician:  Azzie Glatter, FNP Primary Gastroenterologist:  Dr. Tarri Glenn    Reason for Consultation: Hyponatremia, ascites, cirrhosis.   HPI: Kara Torres is a 49 y.o. female.  PMH Chronic hyponatremia.  CKD.  Nonalcoholic fatty liver disease, cirrhosis with alpha-1 antitrypsin deficiency.  Liver biopsy 2014: moderate steatohepatitis, grade 1-2 inflammation, stage I fibrosis, stains with positive diastase resistant intra cyto-plasmic globules c/w alpha 1 antitrypsin deficiency.  Ascites requiring large-volume paracentesis.  Not on beta-blocker due to progressive renal insufficiency.  Pression.  Cognitive impairment, ? HE. 08/20/2019 paracentesis , 5 L removed.  No SBP. 09/09/2019 paracentesis, 9.1 L removed.  Lipid studies not obtained.  No albumin post tap. 04/28/2019 colonoscopy.  For weight loss, abdominal pain.  Nonbleeding external and internal hemorrhoids.  Sessile, 1 mm polyp at descending colon.  Path: underlying lymphoid aggregate, no dysplasia. 04/28/2019 EGD.  For N/V, patient reported 50 pound weight loss, abdominal pain.  Nonbleeding grade 2 distal esophageal varices portal hypertensive gastropathy.  Small HH. Gastric path: mild reactive gastropathy and mild chronic gastritis, no H. pylori.  Benign, unremarkable duodenal path.    Telehealth visit with Dr. Tarri Glenn on 09/11/2019 regarding progressive hyponatremia and renal insufficiency.  Recent sodium 122 on 09/22/2019, down from mid to upper 120s a couple of weeks previously..  Potassium as low as 2.8.  Creatinine to 1.5, normal BUN Within the last 7 to 10 days Dr. Tarri Glenn adjusted her diuretics.  Lasix 40 mg/day was discontinued.  Aldactone dose  decreased to 25 mg/day.    Patient sent by Dr. Tarri Glenn to the ED 3/3 for management of hyperosmolar hyponatremia, she stayed overnight and left AMA the next day.  Patient had no complaints.  No seizures, N/V, diarrhea, dizziness, chest pain, dyspnea or progression in chronic peripheral edema.  Dr. Ardis Hughs planned GI to see her the next day but she had left.  Dr Ardis Hughs felt she would probably need adjustments in her diuretic regimen.  She received 1 L IV normal saline.  Her collected were urine sodium, urine osmolality, TSH.  At discharge meds included no med changes: Aldactone 25 mg/day, bid Protonix, lactulose 20 g tid, oral iron qod but pr not compliant w all of these.    Despite Protonix bid, has chronic nausea vomiting, emesis is bilious.  Vomits or dry heaves daily for a year and a half.  Zofran does not reliably help.  Not taking scheduled lactulose.  She takes it as needed when she is constipated.  She is not having the suggested 3-4 bowel movements daily.  Her belly has become progressively swollen she is put on about 13 pounds since the 2/17 paracentesis, it causes some discomfort but no significant pain.  It also causes some shortness of breath.  Seen by cardiologist, Dr. Radford Pax yesterday.  Patient visit for prolonged QTC.  The recommended she come off fluoxetine, stop Imodium and stop Zofran all of these medicines known to prolong QTC.  She  will need to follow-up with her psychiatrist regarding the fluoxetine.    Sent back to ED for management of hyponatremia Hb 8.7, previously 9.6-11.2 in February.  MCV normal.  Platelets normal.  INR 1.4 Creatinine 1.2.  Sodium 126.  Potassium 3.1.   T bili 1.5.  AST/ALT 69/36.  Alk phos 135. COVID-19 negative. Pending tests include urine sodium and osmolality. She is on 2 g sodium, 2 L fluid restricted diet.  Not receiving and has not received any IV fluids. Blood pressures are low currently in the 80s-90s/60.  Heart rate mid 80s-mid 90s.  Social  history He does not drink alcohol.  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  Her support includes members of her church and a friend from work who lives nearby.  Patient is unmarried, has no children.  Nurse asked the patient if she wanted anybody to be able to receive information or visit and patient said no.   Past Medical History:  Diagnosis Date  . Alcohol abuse 06/19/2019  . Allergy   . Alpha-1-antitrypsin deficiency ([place])   . Anxiety   . Ascites due to alcoholic cirrhosis ([place])   . Asthma   . Cataract of both eyes 06/19/2019  . Cataracts, bilateral   . Chronic gastritis without bleeding 06/19/2019  . Chronic seasonal allergic rhinitis due to pollen 10/17/2018  . Daytime sleepiness 04/28/2017  . Depression   . Diarrhea 10/17/2018  . Dyslipidemia 07/13/2016  . Elevated serum creatinine 08/2019  . Esophageal varices ([place])   . Esophageal varices in alcoholic cirrhosis ([place]) 16/04/9603  . Essential hypertension 07/13/2016  . Eye exam abnormal 12/10/2018  . Fatty liver disease, nonalcoholic 54/03/8118  . Generalized anxiety disorder 07/13/2016  . History of cholecystectomy   . Hypocalcemia 08/2019  . Hyponatremia 08/2019  . Liver disease   . Nausea and vomiting 10/17/2018  . Obesity 07/13/2016  . Visual disturbance 12/10/2018  . Vitamin D deficiency 06/2019    Past Surgical History:  Procedure Laterality Date  . CHOLECYSTECTOMY  2000  . IR PARACENTESIS  08/20/2019  . IR PARACENTESIS  09/09/2019    Prior to Admission medications   Medication Sig Start Date End Date Taking? Authorizing Provider  atorvastatin (LIPITOR) 40 MG tablet Take 1 tablet (40 mg total) by mouth daily. 06/26/19  Yes Azzie Glatter, FNP  cetirizine (ZYRTEC) 10 MG tablet Take 1 tablet (10 mg total) by mouth daily. Patient taking differently: Take 10 mg by mouth at bedtime.  06/26/19  Yes Azzie Glatter, FNP  CHERRY PO Take 1 tablet by mouth daily.   Yes  [provider]  hydrOXYzine (ATARAX/VISTARIL) 50 MG tablet Take 50-150 mg by mouth 3 (three) times daily as needed for anxiety or itching.    Yes [provider]  Lactulose 20 GM/30ML SOLN Take 30 mLs (20 g total) by mouth 3 (three) times daily. 09/11/19  Yes Thornton Park, MD  pantoprazole (PROTONIX) 40 MG tablet Take 1 tablet (40 mg total) by mouth 2 (two) times daily before a meal. Patient taking differently: Take 40 mg by mouth 2 (two) times daily.  08/25/19  Yes Levin Erp, PA  spironolactone (ALDACTONE) 25 MG tablet Take 1 tablet (25 mg total) by mouth daily. 09/18/19 10/18/19 Yes Thornton Park, MD  Vitamin D, Ergocalciferol, (DRISDOL) 1.25 MG (50000 UNIT) CAPS capsule Take 1 capsule (50,000 Units total) by mouth every 7 (seven) days. 09/15/19  Yes Azzie Glatter, FNP  ferrous sulfate (  FERROUSUL) 325 (65 FE) MG tablet Take 1 tablet (325 mg total) by mouth every other day. Patient not taking: Reported on 09/25/2019 08/12/19   Levin Erp, PA    Scheduled Meds: . atorvastatin  40 mg Oral Daily  . enoxaparin (LOVENOX) injection  40 mg Subcutaneous Q24H  . loratadine  10 mg Oral Daily  . pantoprazole  40 mg Oral BID   Infusions:  PRN Meds: hydrOXYzine   Allergies as of 09/25/2019 - Review Complete 09/25/2019  Allergen Reaction Noted  . Epinephrine  12/25/2014  . Hydrocodone Rash 12/25/2014    Family History  Problem Relation Age of Onset  . Stroke Mother   . Hyperlipidemia Mother   . Diabetes Mother   . Diabetes Father   . Hypertension Father   . Hyperlipidemia Father   . Mental illness Father   . Stroke Father   . Diabetes Maternal Grandmother   . Heart disease Maternal Grandmother   . Hypertension Maternal Grandmother   . Heart attack Maternal Grandmother   . Colon cancer Other        multiple great aunts, great uncles  . Esophageal cancer Neg Hx   . Stomach cancer Neg Hx     Social History   Socioeconomic History  .  Marital status: Divorced    Spouse name: Not on file  . Number of children: Not on file  . Years of education: Not on file  . Highest education level: Not on file  Occupational History  . Occupation: Therapist, art  Tobacco Use  . Smoking status: Former Smoker    Types: Cigarettes  . Smokeless tobacco: Never Used  Substance and Sexual Activity  . Alcohol use: Not Currently    Alcohol/week: 2.0 standard drinks    Types: 2 Cans of beer per week  . Drug use: Yes    Types: Marijuana    Comment: Marijuana edibles  . Sexual activity: Not Currently  Other Topics Concern  . Not on file  Social History Narrative  . Not on file   Social Determinants of Health   Financial Resource Strain:   . Difficulty of Paying Living Expenses: Not on file  Food Insecurity:   . Worried About Charity fundraiser in the Last Year: Not on file  . Ran Out of Food in the Last Year: Not on file  Transportation Needs:   . Lack of Transportation (Medical): Not on file  . Lack of Transportation (Non-Medical): Not on file  Physical Activity:   . Days of Exercise per Week: Not on file  . Minutes of Exercise per Session: Not on file  Stress:   . Feeling of Stress : Not on file  Social Connections:   . Frequency of Communication with Friends and Family: Not on file  . Frequency of Social Gatherings with Friends and Family: Not on file  . Attends Religious Services: Not on file  . Active Member of Clubs or Organizations: Not on file  . Attends Archivist Meetings: Not on file  . Marital Status: Not on file  Intimate Partner Violence:   . Fear of Current or Ex-Partner: Not on file  . Emotionally Abused: Not on file  . Physically Abused: Not on file  . Sexually Abused: Not on file    REVIEW OF SYSTEMS: Constitutional: No profound fatigue. ENT:  No nose bleeds Pulm: Shortness of breath with moderate exertion, it is hard to take a deep breath when necessary because of the abdominal  swelling CV:  No palpitations, no angina.  Minor lower extremity edema is stable. GU:  No hematuria, no frequency GI: No dysphagia.  No black or bloody stools. Heme: No excessive bleeding or bruising Transfusions: None Neuro:  No headaches, no peripheral tingling or numbness.  Dizziness is chronic.  No syncope, no seizures. Derm: Skin eruptions on her back are intermittently pruritic. Endocrine:  No sweats or chills.  No polyuria or dysuria Immunization: Reviewed. Travel:  None beyond local counties in last few months.    PHYSICAL EXAM: Vital signs in last 24 hours: Vitals:   09/25/19 2104 09/26/19 0556  BP: (!) 83/58 95/61  Pulse: 85 93  Resp: 18 16  Temp: (!) 97.4 F (36.3 C) 98.2 F (36.8 C)  SpO2: 98% 97%   Wt Readings from Last 3 Encounters:  09/25/19 68.6 kg  09/23/19 64.5 kg  09/15/19 64 kg    General: Pleasant, does not look acutely ill.  Alert. Head: No facial asymmetry or swelling.  No signs of head trauma. Eyes: No conjunctival pallor.  No scleral icterus. Ears: Not hard of hearing Nose: Congestion or discharge. Mouth: Dentition.  Oral mucosa moist, pink, clear.  Benign ridge of tissue in the midline soft palate.  Tongue midline Neck: No JVD, no masses, no thyromegaly. Lungs: CTA bilaterally.  No labored breathing or cough. Heart: RRR.  No MRG.  S1, S2 present. Abdomen: Soft.  Protuberant, moderately tense.  Not tender but exam causes discomfort from increased pressure.  Active bowel sounds.  No hernias. Rectal: Deferred Musc/Skeltl: No joint redness, swelling or gross deformities. Extremities: Slight, nonpitting pedal edema. Neurologic: Oriented x3.  No asterixis or tremors.  No gross deficits or weakness.  Moves all 4 limbs. Skin: Multiple punctate erythematous excoriated lesions on her back.  A few telangiectasia on the front upper chest Nodes: No cervical adenopathy Psych: Cooperative, pleasant, fluid speech.  Intake/Output from previous day: No  intake/output data recorded. Intake/Output this shift: No intake/output data recorded.  LAB RESULTS: Recent Labs    09/23/19 1603 09/25/19 1600 09/26/19 0629  WBC 7.4 6.3 5.4  HGB 10.0* 10.0* 8.7*  HCT 30.6* 31.1* 26.2*  PLT 206 211 166   BMET Lab Results  Component Value Date   NA 127 (L) 09/26/2019   NA 126 (L) 09/25/2019   NA 123 (L) 09/23/2019   K 3.1 (L) 09/26/2019   K 3.2 (L) 09/25/2019   K 3.7 09/23/2019   CL 96 (L) 09/26/2019   CL 93 (L) 09/25/2019   CL 90 (L) 09/23/2019   CO2 24 09/26/2019   CO2 23 09/25/2019   CO2 22 09/23/2019   GLUCOSE 102 (H) 09/26/2019   GLUCOSE 94 09/25/2019   GLUCOSE 63 (L) 09/23/2019   BUN 18 09/26/2019   BUN 19 09/25/2019   BUN 19 09/23/2019   CREATININE 1.25 (H) 09/26/2019   CREATININE 1.36 (H) 09/25/2019   CREATININE 1.21 (H) 09/23/2019   CALCIUM 8.1 (L) 09/26/2019   CALCIUM 8.3 (L) 09/25/2019   CALCIUM 8.1 (L) 09/23/2019   LFT Recent Labs    09/23/19 1603 09/25/19 1600 09/26/19 0629  PROT 6.7 7.0 5.8*  ALBUMIN 2.3* 2.6* 2.2*  AST 67* 69* 57*  ALT 31 36 30  ALKPHOS 135* 135* 121  BILITOT 1.6* 1.4* 1.5*   PT/INR Lab Results  Component Value Date   INR 1.4 (H) 09/26/2019   INR 1.3 (H) 09/25/2019   INR 1.4 (H) 04/28/2019   Hepatitis Panel No results for input(s): HEPBSAG,  HCVAB, [place], HEPBIGM in the last 72 hours. C-Diff No components found for: CDIFF Lipase     Component Value Date/Time   LIPASE 34 09/23/2019 1603    Drugs of Abuse  No results found for: LABOPIA, COCAINSCRNUR, LABBENZ, AMPHETMU, THCU, LABBARB   RADIOLOGY STUDIES: No results found.   IMPRESSION:   *    Acute on chronic hyponatremia.    *    Progressive ascites.  SBP on paracentesis fluid 01/8587 Management complicated by increasing CKD, declining GFR, especially prominent in the days following 09/09/2019 large volume paracentesis without post tap albumin supplementation.  Fortunately creatinine is trending down. Within the last 10  days, Lasix discontinued and dose of Aldactone decreased.  May be able to restart Lasix now  *    Hypokalemia.  Chronic, intermittent.  *    Cirrhosis of the liver and patient with nonalcoholic fatty liver and alpha-1 antitrypsin deficiency.  *    Coagulopathy.  *    Normocytic anemia.  Low iron level in 05/2019.  Is not taking previously prescribed iron supplements.  *    Per previous notes there is been some cognitive impairment and question of HE but has not had elevated ammonia is on 3 occasions within the last 5 weeks.  Patient intermittently compliant with prescribed lactulose.  *     Chronic nausea, nonbloody/bilious emesis for a year and a half.  No findings to explain symptoms based on EGD 04/2019.  As of yesterday was asked to discontinue Zofran which was not consistently effective in managing nausea anyway.  *   Grade 2, nonbleeding esophageal varices and portal hypertensive gastropathy on EGD 04/2019.  Has not been on beta-blocker due to renal insufficiency, ascites  *     Prolonged QTC.  Cardiology asked her to discontinue Imodium, Zofran immediately and look for alternatives to fluoxetine as these can all cause prolonged QTC.   PLAN:     *    Paracentesis will allow removal of up to 5 L of fluid and order post tap albumin infusion x 3 q 6 hours.  *   Bmet, anemia studies in AM.  *   Defer diuretic dosing to MDs.    *   ?Should we involve nephrology?   Kara Torres  09/26/2019, 9:17 AM Phone [phone]     Attending Physician Note   I have taken a history, examined the patient and reviewed the chart. I agree with the Advanced Practitioner's note, impression and recommendations.  Decompensated NASH, A1AT deficiency cirrhosis with refractory ascites, non bleeding esophageal varices, with CKD and hyponatremia complicating mgmt.  Na improved, 127 today Cr at 1.25 today Anemia  LVP with IV albumin today, max of 5L Send ascites for cell counts, culture,  albumin Nephrology consult today BMET in am Resume diuretics and add midodrine tomorrow if renal consult agrees Avoid betablockers Anemia blood work is pending  Lucio Edward, MD [facility]

## 2019-09-26 NOTE — Consult Note (Signed)
Reason for Consult: Hyponatremia Referring Physician:  Dr. Cruzita Lederer  Chief Complaint: Lab abnormality  Assessment/Plan: 1. Hyponatremia - hypervolemic variety and treatment is fluid restriction which she was not doing. SHe normally consumes at least 2L per day. I counseled her that at minimum that needs to come down to 1.5L and possibly even to 1.2L. - Will check a urine osmolality.  -  Lasix  would decrease concentration gradient allowing more free water to be voided but really depends on what the current concentration gradient is. - Tolvaptan would work well in this pt with basically unregulated vasopressin release but it is very expensive and she is uninsured.  2. AKI resolving but may have baseline CKD from the liver disease, likely at least CKD3 given there is hemodilution being overloaded with cirrhsois. 3. Decompensated cirrhosis s/p LVP today.     HPI: Kara Torres is an 49 y.o. female with known alcoholic cirrhosis who quit ETOH in 2020 but unfortunately has EV, recurrent ascites requiring LVP, last one just a week ago. She states that she is not on a diuretic. She denied CP/ dyspnea/ abd pain but endorses intermittent nausea. She has gained 13 lbs since the last LVP but denies lower extremity swelling, cough, fever, myalgias. She also denies obstructive like symptoms, dysuria, hematuria, or foamy urine. Of note she signed out early ~3/2-3/3 wanting everything to be done as an outpt; her Na was 123 at time when she left AMA. In the ED this time her Na was actually 126 with a creatinine of 1.36 and alb of 2.6 but at time of consultation the cr had decreased to 1.28. She denies any NSAID and Prozac recently discontinued. use.    ROS Pertinent items are noted in HPI.  Chemistry and CBC: Creat  Date/Time Value Ref Range Status  04/19/2017 04:25 PM 0.80 0.50 - 1.10 mg/dL Final  07/13/2016 03:48 PM 0.72 0.50 - 1.10 mg/dL Final  01/26/2016 11:47 AM 0.80 0.50 - 1.10 mg/dL Final    Creatinine, Ser  Date/Time Value Ref Range Status  09/26/2019 06:29 AM 1.25 (H) 0.44 - 1.00 mg/dL Final  09/25/2019 04:00 PM 1.36 (H) 0.44 - 1.00 mg/dL Final  09/23/2019 04:03 PM 1.21 (H) 0.44 - 1.00 mg/dL Final  09/22/2019 03:15 PM 1.20 0.40 - 1.20 mg/dL Final  09/17/2019 04:16 PM 1.28 (H) 0.40 - 1.20 mg/dL Final  09/14/2019 09:02 AM 1.54 (H) 0.40 - 1.20 mg/dL Final  09/11/2019 03:27 PM 1.55 (H) 0.40 - 1.20 mg/dL Final  09/03/2019 04:58 PM 1.29 (H) 0.44 - 1.00 mg/dL Final  09/02/2019 10:35 AM 1.14 0.40 - 1.20 mg/dL Final  09/02/2019 10:34 AM 1.14 0.40 - 1.20 mg/dL Final  04/28/2019 03:29 PM 0.60 0.40 - 1.20 mg/dL Final  04/18/2018 01:48 PM 0.89 0.57 - 1.00 mg/dL Final  10/18/2017 03:51 PM 1.02 (H) 0.57 - 1.00 mg/dL Final  07/09/2017 11:02 AM 0.70 0.57 - 1.00 mg/dL Final   Recent Labs  Lab 09/22/19 1515 09/23/19 1603 09/25/19 1600 09/26/19 0629  NA 122* 123* 126* 127*  K 3.6 3.7 3.2* 3.1*  CL 89* 90* 93* 96*  CO2 26 22 23 24   GLUCOSE 94 63* 94 102*  BUN 18 19 19 18   CREATININE 1.20 1.21* 1.36* 1.25*  CALCIUM 8.6 8.1* 8.3* 8.1*   Recent Labs  Lab 09/23/19 1603 09/25/19 1600 09/26/19 0629  WBC 7.4 6.3 5.4  NEUTROABS 4.3 4.1  --   HGB 10.0* 10.0* 8.7*  HCT 30.6* 31.1* 26.2*  MCV 86.4 88.6 87.3  PLT 206 211 166   Liver Function Tests: Recent Labs  Lab 09/23/19 1603 09/25/19 1600 09/26/19 0629  AST 67* 69* 57*  ALT 31 36 30  ALKPHOS 135* 135* 121  BILITOT 1.6* 1.4* 1.5*  PROT 6.7 7.0 5.8*  ALBUMIN 2.3* 2.6* 2.2*   Recent Labs  Lab 09/23/19 1603  LIPASE 34   Recent Labs  Lab 09/23/19 1603 09/25/19 1601  AMMONIA 15 17   Cardiac Enzymes: No results for input(s): CKTOTAL, CKMB, CKMBINDEX, TROPONINI in the last 168 hours. Iron Studies: No results for input(s): IRON, TIBC, TRANSFERRIN, FERRITIN in the last 72 hours. PT/INR: @LABRCNTIP (inr:5)  Xrays/Other Studies: ) Results for orders placed or performed during the hospital encounter of 09/25/19  (from the past 48 hour(s))  Comprehensive metabolic panel     Status: Abnormal   Collection Time: 09/25/19  4:00 PM  Result Value Ref Range   Sodium 126 (L) 135 - 145 mmol/L   Potassium 3.2 (L) 3.5 - 5.1 mmol/L   [place] 93 (L) 98 - 111 mmol/L   CO2 23 22 - 32 mmol/L   Glucose, Bld 94 70 - 99 mg/dL    Comment: Glucose reference range applies only to samples taken after fasting for at least 8 hours.   BUN 19 6 - 20 mg/dL   Creatinine, Ser 1.36 (H) 0.44 - 1.00 mg/dL   Calcium 8.3 (L) 8.9 - 10.3 mg/dL   Total Protein 7.0 6.5 - 8.1 g/dL   Albumin 2.6 (L) 3.5 - 5.0 g/dL   AST 69 (H) 15 - 41 U/L   ALT 36 0 - 44 U/L   Alkaline Phosphatase 135 (H) 38 - 126 U/L   Total Bilirubin 1.4 (H) 0.3 - 1.2 mg/dL   GFR calc non Af Amer 47 (L) >60 mL/min   GFR calc Af Amer 54 (L) >60 mL/min   Anion gap 10 5 - 15    Comment: Performed at [facility], [place] [address], [place], [place] [address]  CBC with Differential     Status: Abnormal   Collection Time: 09/25/19  4:00 PM  Result Value Ref Range   WBC 6.3 4.0 - 10.5 K/uL   RBC 3.51 (L) 3.87 - 5.11 MIL/uL   Hemoglobin 10.0 (L) 12.0 - 15.0 g/dL   HCT 31.1 (L) 36.0 - 46.0 %   MCV 88.6 80.0 - 100.0 fL   MCH 28.5 26.0 - 34.0 pg   MCHC 32.2 30.0 - 36.0 g/dL   RDW 18.6 (H) 11.5 - 15.5 %   Platelets 211 150 - 400 K/uL   nRBC 0.0 0.0 - 0.2 %   Neutrophils Relative % 66 %   Neutro Abs 4.1 1.7 - 7.7 K/uL   Lymphocytes Relative 22 %   Lymphs Abs 1.4 0.7 - 4.0 K/uL   Monocytes Relative 9 %   Monocytes Absolute 0.5 0.1 - 1.0 K/uL   Eosinophils Relative 2 %   Eosinophils Absolute 0.2 0.0 - 0.5 K/uL   Basophils Relative 1 %   Basophils Absolute 0.0 0.0 - 0.1 K/uL   Immature Granulocytes 0 %   Abs Immature Granulocytes 0.02 0.00 - 0.07 K/uL    Comment: Performed at [facility], [place] [address], [place], [place] [address]  Magnesium     Status: None   Collection Time: 09/25/19  4:00 PM  Result Value Ref Range    Magnesium 2.2 1.7 - 2.4 mg/dL    Comment: Performed at [facility]  [place] [address] [place], [place] [address]  Protime-INR     Status: Abnormal   Collection Time: 09/25/19  4:00 PM  Result Value Ref Range   Prothrombin Time 16.4 (H) 11.4 - 15.2 seconds   INR 1.3 (H) 0.8 - 1.2    Comment: (NOTE) INR goal varies based on device and disease states. Performed at [facility], [place] [address] [place], [place] [address]   Ammonia     Status: None   Collection Time: 09/25/19  4:01 PM  Result Value Ref Range   Ammonia 17 9 - 35 umol/L    Comment: Performed at [facility], [place] [address], [place], [place] [address]  SARS CORONAVIRUS 2 (TAT 6-24 HRS) Nasopharyngeal Nasopharyngeal Swab     Status: None   Collection Time: 09/25/19  6:01 PM   Specimen: Nasopharyngeal Swab  Result Value Ref Range   SARS Coronavirus 2 NEGATIVE NEGATIVE    Comment: (NOTE) SARS-CoV-2 target nucleic acids are NOT DETECTED. The SARS-CoV-2 RNA is generally detectable in upper and lower respiratory specimens during the acute phase of infection. Negative results do not preclude SARS-CoV-2 infection, do not rule out co-infections with other pathogens, and should not be used as the sole basis for treatment or other patient management decisions. Negative results must be combined with clinical observations, patient history, and epidemiological information. The expected result is Negative. Fact Sheet for Patients: SugarRoll.be Fact Sheet for Healthcare Providers: https://www.woods-mathews.com/ This test is not yet approved or cleared by the [place] FDA and  has been authorized for detection and/or diagnosis of SARS-CoV-2 by FDA under an Emergency Use Authorization (EUA). This EUA will remain  in effect (meaning this test can be used) for the duration of the COVID-19 declaration under Section 56 4(b)(1) of the Act,  21 U.S.C. section 360bbb-3(b)(1), unless the authorization is terminated or revoked sooner. Performed at [facility], [place] [address], [place], [place] [address]   Sodium, urine, random     Status: None   Collection Time: 09/26/19  5:24 AM  Result Value Ref Range   Sodium, Ur <10 mmol/L    Comment: Performed at [facility], [place] [address] [place], [place] [address]  HIV Antibody (routine testing w rflx)     Status: None   Collection Time: 09/26/19  6:29 AM  Result Value Ref Range   HIV Screen 4th Generation wRfx NON REACTIVE NON REACTIVE    Comment: Performed at [place] [address] [place] [address], [place], [place] [address]  Comprehensive metabolic panel     Status: Abnormal   Collection Time: 09/26/19  6:29 AM  Result Value Ref Range   Sodium 127 (L) 135 - 145 mmol/L   Potassium 3.1 (L) 3.5 - 5.1 mmol/L   [place] 96 (L) 98 - 111 mmol/L   CO2 24 22 - 32 mmol/L   Glucose, Bld 102 (H) 70 - 99 mg/dL    Comment: Glucose reference range applies only to samples taken after fasting for at least 8 hours.   BUN 18 6 - 20 mg/dL   Creatinine, Ser 1.25 (H) 0.44 - 1.00 mg/dL   Calcium 8.1 (L) 8.9 - 10.3 mg/dL   Total Protein 5.8 (L) 6.5 - 8.1 g/dL   Albumin 2.2 (L) 3.5 - 5.0 g/dL   AST 57 (H) 15 - 41 U/L   ALT 30 0 - 44 U/L   Alkaline Phosphatase 121 38 - 126 U/L   Total Bilirubin  1.5 (H) 0.3 - 1.2 mg/dL   GFR calc non Af Amer 52 (L) >60 mL/min   GFR calc Af Amer 60 (L) >60 mL/min   Anion gap 7 5 - 15    Comment: Performed at [facility], [place] [address] [place], [place] [address]  CBC     Status: Abnormal   Collection Time: 09/26/19  6:29 AM  Result Value Ref Range   WBC 5.4 4.0 - 10.5 K/uL   RBC 3.00 (L) 3.87 - 5.11 MIL/uL   Hemoglobin 8.7 (L) 12.0 - 15.0 g/dL   HCT 26.2 (L) 36.0 - 46.0 %   MCV 87.3 80.0 - 100.0 fL   MCH 29.0 26.0 - 34.0 pg   MCHC 33.2 30.0 - 36.0 g/dL   RDW 18.6 (H) 11.5 - 15.5 %   Platelets 166 150 - 400 K/uL    nRBC 0.0 0.0 - 0.2 %    Comment: Performed at [facility], [place] [address] [place], [place] [address]  Protime-INR     Status: Abnormal   Collection Time: 09/26/19  6:29 AM  Result Value Ref Range   Prothrombin Time 17.1 (H) 11.4 - 15.2 seconds   INR 1.4 (H) 0.8 - 1.2    Comment: (NOTE) INR goal varies based on device and disease states. Performed at [facility], [place] [address] [place] [address], [place], [place] [address]   TSH     Status: None   Collection Time: 09/26/19  6:29 AM  Result Value Ref Range   TSH 2.175 0.350 - 4.500 uIU/mL    Comment: Performed by a 3rd Generation assay with a functional sensitivity of <=0.01 uIU/mL. Performed at [facility], [place] [address], [place], [place] [address]   Osmolality     Status: Abnormal   Collection Time: 09/26/19  6:29 AM  Result Value Ref Range   Osmolality 269 (L) 275 - 295 mOsm/kg    Comment: Performed at [facility], [place] [place], [place] [address]   US Paracentesis  Result Date: 09/26/2019 INDICATION: Patient with history of cirrhosis, recurrent ascites. Request is made for diagnostic and therapeutic paracentesis of up to 5 L maximum. EXAM: ULTRASOUND GUIDED PARACENTESIS MEDICATIONS: 10 mL 1% lidocaine COMPLICATIONS: None immediate. PROCEDURE: Informed written consent was obtained from the patient after a discussion of the risks, benefits and alternatives to treatment. A timeout was performed prior to the initiation of the procedure. Initial ultrasound scanning demonstrates a large amount of ascites within the left lateral abdomen. The left lateral abdomen was prepped and draped in the usual sterile fashion. 1% lidocaine was used for local anesthesia. Following this, a 19 gauge, 7-cm, Yueh catheter was introduced. An ultrasound image was saved for documentation purposes. The paracentesis was performed. The catheter was removed and a dressing was applied. The patient tolerated  the procedure well without immediate post procedural complication. Patient received post-procedure intravenous albumin; see nursing notes for details. FINDINGS: A total of approximately 5.0 liters of yellow fluid was removed. Samples were sent to the laboratory as requested by the clinical team. IMPRESSION: Successful ultrasound-guided diagnostic and therapeutic paracentesis yielding 5.0 liters of peritoneal fluid. Read by: Brynda Greathouse PA-C Electronically Signed   By: Sandi Mariscal M.D.   On: 09/26/2019 15:08    PMH:   Past Medical History:  Diagnosis Date  . Alcohol abuse 06/19/2019  . Allergy   . Alpha-1-antitrypsin deficiency ([place])   . Anxiety   . Ascites due to  alcoholic cirrhosis ([place])   . Asthma   . Catar[place] of both eyes 06/19/2019  . Cataracts, bilateral   . Chronic gastritis without bleeding 06/19/2019  . Chronic seasonal allergic rhinitis due to pollen 10/17/2018  . Daytime sleepiness 04/28/2017  . Depression   . Diarrhea 10/17/2018  . Dyslipidemia 07/13/2016  . Elevated serum creatinine 08/2019  . Esophageal varices ([place])   . Esophageal varices in alcoholic cirrhosis ([place]) [id number]  . Essential hypertension 07/13/2016  . Eye exam abnormal 12/10/2018  . Fatty liver disease, nonalcoholic 0000000  . Generalized anxiety disorder 07/13/2016  . History of cholecystectomy   . Hypocalcemia 08/2019  . Hyponatremia 08/2019  . Liver disease   . Nausea and vomiting 10/17/2018  . Obesity 07/13/2016  . Visual disturbance 12/10/2018  . Vitamin D deficiency 06/2019    PSH:   Past Surgical History:  Procedure Laterality Date  . CHOLECYSTECTOMY  2000  . IR PARACENTESIS  08/20/2019  . IR PARACENTESIS  09/09/2019    Allergies:  Allergies  Allergen Reactions  . Epinephrine   . Hydrocodone Rash    Medications:   Prior to Admission medications   Medication Sig Start Date End Date Taking? Authorizing Provider  atorvastatin (LIPITOR) 40 MG tablet Take 1 tablet (40 mg total) by  mouth daily. 06/26/19  Yes Azzie Glatter, FNP  cetirizine (ZYRTEC) 10 MG tablet Take 1 tablet (10 mg total) by mouth daily. Patient taking differently: Take 10 mg by mouth at bedtime.  06/26/19  Yes Azzie Glatter, FNP  CHERRY PO Take 1 tablet by mouth daily.   Yes [provider]  hydrOXYzine (ATARAX/VISTARIL) 50 MG tablet Take 50-150 mg by mouth 3 (three) times daily as needed for anxiety or itching.    Yes [provider]  Lactulose 20 GM/30ML SOLN Take 30 mLs (20 g total) by mouth 3 (three) times daily. 09/11/19  Yes [place], MD  pantoprazole (PROTONIX) 40 MG tablet Take 1 tablet (40 mg total) by mouth 2 (two) times daily before a meal. Patient taking differently: Take 40 mg by mouth 2 (two) times daily.  08/25/19  Yes Levin Erp, PA  spironolactone (ALDACTONE) 25 MG tablet Take 1 tablet (25 mg total) by mouth daily. 09/18/19 10/18/19 Yes [place], MD  Vitamin D, Ergocalciferol, (DRISDOL) 1.25 MG (50000 UNIT) CAPS capsule Take 1 capsule (50,000 Units total) by mouth every 7 (seven) days. 09/15/19  Yes Azzie Glatter, FNP  ferrous sulfate (FERROUSUL) 325 (65 FE) MG tablet Take 1 tablet (325 mg total) by mouth every other day. Patient not taking: Reported on 09/25/2019 08/12/19   Levin Erp, PA    Discontinued Meds:  There are no discontinued medications.  Social History:  reports that she has quit smoking. Her smoking use included cigarettes. She has never used smokeless tobacco. She reports previous alcohol use of about 2.0 standard drinks of alcohol per week. She reports current drug use. Drug: Marijuana.  Family History:   Family History  Problem Relation Age of Onset  . Stroke Mother   . Hyperlipidemia Mother   . Diabetes Mother   . Diabetes Father   . Hypertension Father   . Hyperlipidemia Father   . Mental illness Father   . Stroke Father   . Diabetes Maternal Grandmother   . Heart disease Maternal Grandmother   .  Hypertension Maternal Grandmother   . Heart attack Maternal Grandmother   . Colon cancer Other  multiple great aunts, great uncles  . Esophageal cancer Neg Hx   . Stomach cancer Neg Hx     Blood pressure 97/69, pulse 93, temperature 98.2 F (36.8 C), temperature source Oral, resp. rate 16, SpO2 97 %. General appearance: alert, cooperative and appears stated age Head: Normocephalic, without obvious abnormality, atraumatic Eyes: negative Neck: no adenopathy, no carotid bruit, supple, symmetrical, trachea midline and thyroid not enlarged, symmetric, no tenderness/mass/nodules Back: symmetric, no curvature. ROM normal. No CVA tenderness. Resp: clear to auscultation bilaterally Chest wall: no tenderness Cardio: regular rate and rhythm GI: Minimally distended Extremities: edema tr Pulses: 2+ and symmetric Skin: Skin color, texture, turgor normal. No rashes or lesions Lymph nodes: Cervical adenopathy: none Neurologic: Grossly normal       Navneet Schmuck, Hunt Oris, MD 09/26/2019, 3:19 PM

## 2019-09-26 NOTE — Procedures (Signed)
PROCEDURE SUMMARY:  Successful US guided paracentesis from left lateral abdomen.  Yielded 5.0 liters of yellow fluid.  No immediate complications.  Pt tolerated well.   Specimen was sent for labs.  EBL < 28mL  Docia Barrier PA-C 09/26/2019 1:40 PM

## 2019-09-27 DIAGNOSIS — K7011 Alcoholic hepatitis with ascites: Secondary | ICD-10-CM

## 2019-09-27 DIAGNOSIS — N1831 Chronic kidney disease, stage 3a: Secondary | ICD-10-CM

## 2019-09-27 DIAGNOSIS — D500 Iron deficiency anemia secondary to blood loss (chronic): Secondary | ICD-10-CM

## 2019-09-27 LAB — COMPREHENSIVE METABOLIC PANEL
ALT: 26 U/L (ref 0–44)
AST: 52 U/L — ABNORMAL HIGH (ref 15–41)
Albumin: 2.7 g/dL — ABNORMAL LOW (ref 3.5–5.0)
Alkaline Phosphatase: 119 U/L (ref 38–126)
Anion gap: 10 (ref 5–15)
BUN: 14 mg/dL (ref 6–20)
CO2: 23 mmol/L (ref 22–32)
Calcium: 8.1 mg/dL — ABNORMAL LOW (ref 8.9–10.3)
Chloride: 97 mmol/L — ABNORMAL LOW (ref 98–111)
Creatinine, Ser: 1.14 mg/dL — ABNORMAL HIGH (ref 0.44–1.00)
GFR calc Af Amer: >60 mL/min (ref >60)
GFR calc non Af Amer: 58 mL/min — ABNORMAL LOW (ref >60)
Glucose, Bld: 75 mg/dL (ref 70–99)
Potassium: 3.3 mmol/L — ABNORMAL LOW (ref 3.5–5.1)
Sodium: 130 mmol/L — ABNORMAL LOW (ref 135–145)
Total Bilirubin: 1.6 mg/dL — ABNORMAL HIGH (ref 0.3–1.2)
Total Protein: 5.9 g/dL — ABNORMAL LOW (ref 6.5–8.1)

## 2019-09-27 LAB — PROTIME-INR
INR: 1.6 — ABNORMAL HIGH (ref 0.8–1.2)
Prothrombin Time: 19 seconds — ABNORMAL HIGH (ref 11.4–15.2)

## 2019-09-27 LAB — CBC
HCT: 25.8 % — ABNORMAL LOW (ref 36.0–46.0)
Hemoglobin: 8.4 g/dL — ABNORMAL LOW (ref 12.0–15.0)
MCH: 28.4 pg (ref 26.0–34.0)
MCHC: 32.6 g/dL (ref 30.0–36.0)
MCV: 87.2 fL (ref 80.0–100.0)
Platelets: 139 10*3/uL — ABNORMAL LOW (ref 150–400)
RBC: 2.96 MIL/uL — ABNORMAL LOW (ref 3.87–5.11)
RDW: 18.3 % — ABNORMAL HIGH (ref 11.5–15.5)
WBC: 5.4 10*3/uL (ref 4.0–10.5)
nRBC: 0 % (ref 0.0–0.2)

## 2019-09-27 LAB — IRON AND TIBC
Iron: 21 ug/dL — ABNORMAL LOW (ref 28–170)
Saturation Ratios: 8 % — ABNORMAL LOW (ref 10.4–31.8)
TIBC: 253 ug/dL (ref 250–450)
UIBC: 232 ug/dL

## 2019-09-27 LAB — RETICULOCYTES
Immature Retic Fract: 9.3 % (ref 2.3–15.9)
RBC.: 2.94 MIL/uL — ABNORMAL LOW (ref 3.87–5.11)
Retic Count, Absolute: 62 10*3/uL (ref 19.0–186.0)
Retic Ct Pct: 2.1 % (ref 0.4–3.1)

## 2019-09-27 LAB — PHOSPHORUS: Phosphorus: 3.3 mg/dL (ref 2.5–4.6)

## 2019-09-27 LAB — FERRITIN: Ferritin: 29 ng/mL (ref 11–307)

## 2019-09-27 LAB — FOLATE: Folate: 9.6 ng/mL (ref >5.9)

## 2019-09-27 LAB — MAGNESIUM: Magnesium: 1.9 mg/dL (ref 1.7–2.4)

## 2019-09-27 LAB — VITAMIN B12: Vitamin B-12: 626 pg/mL (ref 180–914)

## 2019-09-27 MED ORDER — POTASSIUM CHLORIDE CRYS ER 20 MEQ PO TBCR: 20.0000 meq | EXTENDED_RELEASE_TABLET | Freq: Every day | ORAL | Status: DC

## 2019-09-27 MED ORDER — POTASSIUM CHLORIDE CRYS ER 20 MEQ PO TBCR: 20.0000 meq | EXTENDED_RELEASE_TABLET | Freq: Every day | ORAL | 0 refills | Status: DC

## 2019-09-27 MED ORDER — FUROSEMIDE 40 MG PO TABS: 40.0000 mg | ORAL_TABLET | Freq: Every day | ORAL | Status: DC

## 2019-09-27 MED ORDER — SODIUM CHLORIDE 0.9 % IV SOLN
510.0000 mg | Freq: Once | INTRAVENOUS | Status: AC
  Administered 2019-09-27: 11:00:00 510 mg via INTRAVENOUS
  Filled 2019-09-27: qty 510

## 2019-09-27 NOTE — Discharge Instructions (Signed)
Please follow fluid restriction of 1500 mL a day  Please follow-up with Dr. Joelene Millin beavers within a week for repeat labs  Please follow a low-sodium diet  Please weigh yourself daily, if you see weight gain more than 4-5 pounds reach out to your PCP or gastroenterologist  Follow with Azzie Glatter, FNP in 5-7 days  Please get a complete blood count and chemistry panel checked by your Primary MD at your next visit, and again as instructed by your Primary MD. Please get your medications reviewed and adjusted by your Primary MD.  Please request your Primary MD to go over all Hospital Tests and Procedure/Radiological results at the follow up, please get all Hospital records sent to your Prim MD by signing hospital release before you go home.  In some cases, there will be blood work, cultures and biopsy results pending at the time of your discharge. Please request that your primary care M.D. goes through all the records of your hospital data and follows up on these results.  If you had Pneumonia of Lung problems at the Hospital: Please get a 2 view Chest X ray done in 6-8 weeks after hospital discharge or sooner if instructed by your Primary MD.  If you have Congestive Heart Failure: Please call your Cardiologist or Primary MD anytime you have any of the following symptoms:  1) 3 pound weight gain in 24 hours or 5 pounds in 1 week  2) shortness of breath, with or without a dry hacking cough  3) swelling in the hands, feet or stomach  4) if you have to sleep on extra pillows at night in order to breathe  Follow cardiac low salt diet and 1.5 lit/day fluid restriction.  If you have diabetes Accuchecks 4 times/day, Once in AM empty stomach and then before each meal. Log in all results and show them to your primary doctor at your next visit. If any glucose reading is under 80 or above 300 call your primary MD immediately.  If you have Seizure/Convulsions/Epilepsy: Please do not drive,  operate heavy machinery, participate in activities at heights or participate in high speed sports until you have seen by Primary MD or a Neurologist and advised to do so again. Per [facility] statutes, patients with seizures are not allowed to drive until they have been seizure-free for six months.  Use caution when using heavy equipment or power tools. Avoid working on ladders or at heights. Take showers instead of baths. Ensure the water temperature is not too high on the home water heater. Do not go swimming alone. Do not lock yourself in a room alone (i.e. bathroom). When caring for infants or small children, sit down when holding, feeding, or changing them to minimize risk of injury to the child in the event you have a seizure. Maintain good sleep hygiene. Avoid alcohol.   If you had Gastrointestinal Bleeding: Please ask your Primary MD to check a complete blood count within one week of discharge or at your next visit. Your endoscopic/colonoscopic biopsies that are pending at the time of discharge, will also need to followed by your Primary MD.  Get Medicines reviewed and adjusted. Please take all your medications with you for your next visit with your Primary MD  Please request your Primary MD to go over all hospital tests and procedure/radiological results at the follow up, please ask your Primary MD to get all Hospital records sent to his/her office.  If you experience worsening of your admission  symptoms, develop shortness of breath, life threatening emergency, suicidal or homicidal thoughts you must seek medical attention immediately by calling 911 or calling your MD immediately  if symptoms less severe.  You must read complete instructions/literature along with all the possible adverse reactions/side effects for all the Medicines you take and that have been prescribed to you. Take any new Medicines after you have completely understood and accpet all the possible adverse  reactions/side effects.   Do not drive or operate heavy machinery when taking Pain medications.   Do not take more than prescribed Pain, Sleep and Anxiety Medications  Special Instructions: If you have smoked or chewed Tobacco  in the last 2 yrs please stop smoking, stop any regular Alcohol  and or any Recreational drug use.  Wear Seat belts while driving.  Please note You were cared for by a hospitalist during your hospital stay. If you have any questions about your discharge medications or the care you received while you were in the hospital after you are discharged, you can call the unit and asked to speak with the hospitalist on call if the hospitalist that took care of you is not available. Once you are discharged, your primary care physician will handle any further medical issues. Please note that NO REFILLS for any discharge medications will be authorized once you are discharged, as it is imperative that you return to your primary care physician (or establish a relationship with a primary care physician if you do not have one) for your aftercare needs so that they can reassess your need for medications and monitor your lab values.  You can reach the hospitalist office at phone [phone] or fax [phone]   If you do not have a primary care physician, you can call [phone] for a physician referral.  Activity: As tolerated with Full fall precautions use walker/cane & assistance as needed    Diet: low sodium  Disposition Home

## 2019-09-27 NOTE — Plan of Care (Signed)
  Problem: Education: Goal: Knowledge of General Education information will improve Description: Including pain rating scale, medication(s)/side effects and non-pharmacologic comfort measures Outcome: Progressing   Problem: Health Behavior/Discharge Planning: Goal: Ability to manage health-related needs will improve Outcome: Progressing   Problem: Clinical Measurements: Goal: Will remain free from infection Outcome: Progressing Goal: Diagnostic test results will improve Outcome: Progressing   Problem: Coping: Goal: Level of anxiety will decrease Outcome: Progressing

## 2019-09-27 NOTE — Progress Notes (Addendum)
Daily Rounding Note  09/27/2019, 9:06 AM  LOS: 1 day   SUBJECTIVE:   Chief complaint: Ascites.  Cirrhosis.  Hyponatremia. Abdominal discomfort improved.  Had a bowel movement yesterday and today. Wondering about discharge.  OBJECTIVE:         Vital signs in last 24 hours:    Temp:  [98 F (36.7 C)-98.1 F (36.7 C)] 98.1 F (36.7 C) (03/07 0610) Pulse Rate:  [83-92] 83 (03/07 0610) Resp:  [14-17] 16 (03/07 0610) BP: (96-101)/(64-73) 96/64 (03/07 0610) SpO2:  [95 %-100 %] 97 % (03/07 0610) Weight:  [64.2 kg] 64.2 kg (03/07 0610) Last BM Date: 09/26/19 Filed Weights   09/27/19 0610  Weight: 64.2 kg   General: Comfortable, nontoxic. Heart: RRR Chest: No dyspnea or cough.  Clear lungs. Abdomen: Still distended but soft.  Bowel sounds normal, hypoactive. Extremities: Slight lower extremity edema. Neuro/Psych: No tremor or asterixis.  Moves all 4 limbs.  No obvious deficits.  Alert and oriented x3.  Fluid speech.  Calm.  Intake/Output from previous day: 03/06 0701 - 03/07 0700 In: 540 [P.O.:440; IV Piggyback:100] Out: -   Intake/Output this shift: No intake/output data recorded.  Lab Results: Recent Labs    09/25/19 1600 09/26/19 0629 09/27/19 0537  WBC 6.3 5.4 5.4  HGB 10.0* 8.7* 8.4*  HCT 31.1* 26.2* 25.8*  PLT 211 166 139*   BMET Recent Labs    09/25/19 1600 09/26/19 0629 09/27/19 0537  NA 126* 127* 130*  K 3.2* 3.1* 3.3*  CL 93* 96* 97*  CO2 23 24 23   GLUCOSE 94 102* 75  BUN 19 18 14   CREATININE 1.36* 1.25* 1.14*  CALCIUM 8.3* 8.1* 8.1*   LFT Recent Labs    09/25/19 1600 09/26/19 0629 09/27/19 0537  PROT 7.0 5.8* 5.9*  ALBUMIN 2.6* 2.2* 2.7*  AST 69* 57* 52*  ALT 36 30 26  ALKPHOS 135* 121 119  BILITOT 1.4* 1.5* 1.6*   PT/INR Recent Labs    09/26/19 0629 09/27/19 0537  LABPROT 17.1* 19.0*  INR 1.4* 1.6*   Hepatitis Panel No results for input(s): HEPBSAG, HCVAB, HEPAIGM,  HEPBIGM in the last 72 hours.  Studies/Results: US Paracentesis  Result Date: 09/26/2019 INDICATION: Patient with history of cirrhosis, recurrent ascites. Request is made for diagnostic and therapeutic paracentesis of up to 5 L maximum. EXAM: ULTRASOUND GUIDED PARACENTESIS MEDICATIONS: 10 mL 1% lidocaine COMPLICATIONS: None immediate. PROCEDURE: Informed written consent was obtained from the patient after a discussion of the risks, benefits and alternatives to treatment. A timeout was performed prior to the initiation of the procedure. Initial ultrasound scanning demonstrates a large amount of ascites within the left lateral abdomen. The left lateral abdomen was prepped and draped in the usual sterile fashion. 1% lidocaine was used for local anesthesia. Following this, a 19 gauge, 7-cm, Yueh catheter was introduced. An ultrasound image was saved for documentation purposes. The paracentesis was performed. The catheter was removed and a dressing was applied. The patient tolerated the procedure well without immediate post procedural complication. Patient received post-procedure intravenous albumin; see nursing notes for details. FINDINGS: A total of approximately 5.0 liters of yellow fluid was removed. Samples were sent to the laboratory as requested by the clinical team. IMPRESSION: Successful ultrasound-guided diagnostic and therapeutic paracentesis yielding 5.0 liters of peritoneal fluid. Read by: Brynda Greathouse PA-C Electronically Signed   By: Sandi Mariscal M.D.   On: 09/26/2019 15:08    ASSESMENT:   *  Acute on chronic hyponatremia.    Improved.  Sodium 123 >> 130 over previous 5 days.  Management with 2 g sodium, 2 L fluid restricted diet.  *    Progressive ascites.  SBP on paracentesis fluid [id number] Management complicated by increasing CKD, declining GFR, especially prominent in the days following 09/09/2019 large volume paracentesis without post tap albumin supplementation.  Fortunately creatinine is  trending down. Within the last 10 days, Lasix discontinued and dose of Aldactone decreased.   Currently not receiving any diuretics. S/p 5 L tap on 09/26/2019. No SBP.  *    Hypokalemia.  Chronic, intermittent.  Persists but improved.  *    CKD/AKI, improved  *    Cirrhosis of the liver and patient with nonalcoholic fatty liver and alpha-1 antitrypsin deficiency.  *    Coagulopathy.  Worse.  INR 1.3 >> 1.6 over last 2 days.  *    Normocytic anemia.  Low iron level in 05/2019.  Is not taking previously prescribed iron supplements as they induce n/v.  Low iron, low iron saturation, ferritin 29.  Folate, B12 okay.  Received Feraheme w/o problems in past.    *    Per previous notes there is been some cognitive impairment and question of HE but has not had elevated ammonia is on 3 occasions within the last 5 weeks.  Patient intermittently compliant with prescribed lactulose.  *     Chronic nausea, nonbloody/bilious emesis for a year and a half.  No findings to explain symptoms based on EGD 04/2019.  As of yesterday was asked to discontinue Zofran which was not consistently effective in managing nausea anyway.  *   Grade 2, nonbleeding esophageal varices and portal hypertensive gastropathy on EGD 04/2019.  Has not been on beta-blocker due to renal insufficiency, ascites  *    Prolonged QTC.  Cardiology asked her to discontinue Imodium, Zofran immediately and look for alternatives to fluoxetine as these can all cause prolonged QTC.    PLAN   *   Feraheme  *   Reinitiate diuretics? .  ? Repeat paracentesis?  Renal suggested further fluid restrictions as low as 1.2 L/day.    *  Although her sodium has improved enough where she could go home, I wonder if she needs to stay so we can titrate diuretics?   Azucena Freed  09/27/2019, 9:06 AM Phone [phone]    Attending Physician Note   I have taken an interval history, reviewed the chart and examined the patient. I agree with the  Advanced Practitioner's note, impression and recommendations.   Decompensated cirrhosis, NASH and A1AT deficiency, with refractory ascites, esophageal varices, portal gastropathy.   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 Repeat Feraheme   Appreciate renal consult recommendations. Continue fluid restriction and continue to hold diuretics. Follow BMET. Await further recommendations from renal regarding diuretics, other medications for ongoing outpatient mgmt. Consider repeat LVP.  Dr. Silverio Decamp covering [facility] hospital service starting on Monday.    Lucio Edward, MD [facility]

## 2019-09-27 NOTE — Discharge Summary (Addendum)
Physician Discharge Summary  Kara Torres [id number] DOB: 1971-03-04 DOA: 09/25/2019  PCP: Azzie Glatter, FNP  Admit date: 09/25/2019 Discharge date: 09/27/2019  Admitted From: Home Disposition: Home  Recommendations for Outpatient Follow-up:  1. Follow up with Dr. Tarri Glenn in 1 week for repeat labs 2. Follow-up with PCP in 1 week  Home Health: None Equipment/Devices: None  Discharge Condition: Stable CODE STATUS: Full code Diet recommendation: Low-sodium diet, 1500 mL fluid restriction  HPI: Per admitting MD, Kara Torres is a 49 y.o. 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 Tarri Glenn for hyponatremia.  She does have a degree of chronic hyponatremia with sodium of around 128.  She was admitted to the hospital with plans for gastroenterology to see patient the next day, with concerns for needing for large-volume paracentesis and adjustment of her home diuretics and close monitoring of her sodium levels as well as her kidney function.  Shortly after admission, before could be seen the next morning patient left AMA.  She called outpatient GI to see whether her work-up can be done as an outpatient, and was redirected to come to the emergency room.  Evaluated patient in the ER, currently she is asymptomatic, she has no chest pain, no shortness of breath, no abdominal pain, no nausea vomiting.  She reports constipation, and is taking some laxative and now is starting to work and had a bowel movement in the ER.  Since her last paracentesis 9 days ago last Wednesday, she tells me she has gained about 13 pounds but denies significant lower extremity swelling and tells me it is all in her  belly.  Hospital Course / Discharge diagnoses: Principal Problem Acute on chronic hyponatremia -Multifactorial in the setting of liver disease along with third spacing with ascites, baseline sodium 125-128, sodium on admission was 126.  Nephrology was consulted and recommended tighter fluid restriction from 2000 mL that patient was having at home to 1500 cc.  With more fluid restriction her sodium has improved to 130 on the day of discharge  Active Problems Decompensated cirrhosis with recurrent ascites requiring LVP -she underwent ultrasound-guided paracentesis with 5 L removed, and was administered albumin, renal function was stable the day following paracentesis.  I have discussed with Dr. Fuller Plan, patient okay for discharge from GI standpoint with outpatient follow-up Acute kidney injury versus incipient chronic disease stage IIIa -Over the last 30 days, patient's creatinine has slightly increased ranging between 1.1-1.5, she may be developing a degree of chronic kidney disease.  Nephrology was consulted and evaluated patient while hospitalized.  He recommended to continue 40 mg of Lasix and spironolactone, I have discussed with Dr. Augustin Coupe over the phone, patient okay for discharge from renal standpoint with outpatient ongoing monitoring of her renal function. GAD -She was on Prozac, this was recently discontinued a few days ago but patient feels like, I will resume at a lower dose as a taper and this can be discussed further as an outpatient    Discharge Instructions   Allergies as of 09/27/2019      Reactions   Epinephrine    Hydrocodone Rash      Medication List    TAKE these medications   atorvastatin 40 MG tablet Commonly  known as: LIPITOR Take 1 tablet (40 mg total) by mouth daily.   cetirizine 10 MG tablet Commonly known as: ZYRTEC Take 1 tablet (10 mg total) by mouth daily. What changed: when to take this   CHERRY PO Take 1 tablet by mouth daily.   ferrous sulfate 325 (65  FE) MG tablet Commonly known as: FerrouSul Take 1 tablet (325 mg total) by mouth every other day.   hydrOXYzine 50 MG tablet Commonly known as: ATARAX/VISTARIL Take 50-150 mg by mouth 3 (three) times daily as needed for anxiety or itching.   Lactulose 20 GM/30ML Soln Take 30 mLs (20 g total) by mouth 3 (three) times daily.   pantoprazole 40 MG tablet Commonly known as: PROTONIX Take 1 tablet (40 mg total) by mouth 2 (two) times daily before a meal. What changed: when to take this   potassium chloride SA 20 MEQ tablet Commonly known as: KLOR-CON Take 1 tablet (20 mEq total) by mouth daily.   spironolactone 25 MG tablet Commonly known as: Aldactone Take 1 tablet (25 mg total) by mouth daily.   Vitamin D (Ergocalciferol) 1.25 MG (50000 UNIT) Caps capsule Commonly known as: DRISDOL Take 1 capsule (50,000 Units total) by mouth every 7 (seven) days.      Follow-up Information    Thornton Park, MD. Schedule an appointment as soon as possible for a visit in 1 week(s).   Specialty: Gastroenterology Why: for repeat labs Contact information: [address] [place] [address] [phone]           Consultations:  Gastroenterology  Nephrology  Procedures/Studies:  Ultrasound-guided paracentesis  US Paracentesis  Result Date: 09/26/2019 INDICATION: Patient with history of cirrhosis, recurrent ascites. Request is made for diagnostic and therapeutic paracentesis of up to 5 L maximum. EXAM: ULTRASOUND GUIDED PARACENTESIS MEDICATIONS: 10 mL 1% lidocaine COMPLICATIONS: None immediate. PROCEDURE: Informed written consent was obtained from the patient after a discussion of the risks, benefits and alternatives to treatment. A timeout was performed prior to the initiation of the procedure. Initial ultrasound scanning demonstrates a large amount of ascites within the left lateral abdomen. The left lateral abdomen was prepped and draped in the usual sterile fashion. 1% lidocaine  was used for local anesthesia. Following this, a 19 gauge, 7-cm, Yueh catheter was introduced. An ultrasound image was saved for documentation purposes. The paracentesis was performed. The catheter was removed and a dressing was applied. The patient tolerated the procedure well without immediate post procedural complication. Patient received post-procedure intravenous albumin; see nursing notes for details. FINDINGS: A total of approximately 5.0 liters of yellow fluid was removed. Samples were sent to the laboratory as requested by the clinical team. IMPRESSION: Successful ultrasound-guided diagnostic and therapeutic paracentesis yielding 5.0 liters of peritoneal fluid. Read by: Brynda Greathouse PA-C Electronically Signed   By: Sandi Mariscal M.D.   On: 09/26/2019 15:08   IR Paracentesis  Result Date: 09/09/2019 INDICATION: Patient with history of alcoholic cirrhosis, recurrent ascites. Request to IR for therapeutic paracentesis. EXAM: ULTRASOUND GUIDED THERAPEUTIC PARACENTESIS MEDICATIONS: 10 mL 1% lidocaine COMPLICATIONS: None immediate. PROCEDURE: Informed written consent was obtained from the patient after a discussion of the risks, benefits and alternatives to treatment. A timeout was performed prior to the initiation of the procedure. Initial ultrasound scanning demonstrates a large amount of ascites within the left lower abdominal quadrant. The left lower abdomen was prepped and draped in the usual sterile fashion. 1% lidocaine was used for local anesthesia. Following this, a 19 gauge, 7-cm,  Yueh catheter was introduced. An ultrasound image was saved for documentation purposes. The paracentesis was performed. The catheter was removed and a dressing was applied. The patient tolerated the procedure well without immediate post procedural complication. FINDINGS: A total of approximately 9.1 L of clear yellow fluid was removed. IMPRESSION: Successful ultrasound-guided paracentesis yielding 9.1 liters of peritoneal  fluid. Read by Candiss Norse, PA-C Electronically Signed   By: Lucrezia Europe M.D.   On: 09/09/2019 11:41      Subjective: - no chest pain, shortness of breath, no abdominal pain, nausea or vomiting. Wants to go home  Discharge Exam: BP 96/64   Pulse 83   Temp 98.1 F (36.7 C) (Oral)   Resp 16   Wt 64.2 kg   SpO2 97%   BMI 24.29 kg/m   General: Pt is alert, awake, not in acute distress Cardiovascular: RRR, S1/S2 +, no rubs, no gallops Respiratory: CTA bilaterally, no wheezing, no rhonchi Abdominal: Still distended but improved, nontender, bowel sounds positive Extremities: no edema, no cyanosis    The results of significant diagnostics from this hospitalization (including imaging, microbiology, ancillary and laboratory) are listed below for reference.     Microbiology: Recent Results (from the past 240 hour(s))  SARS CORONAVIRUS 2 (TAT 6-24 HRS) Nasopharyngeal Nasopharyngeal Swab     Status: None   Collection Time: 09/23/19  5:02 PM   Specimen: Nasopharyngeal Swab  Result Value Ref Range Status   SARS Coronavirus 2 NEGATIVE NEGATIVE Final    Comment: (NOTE) SARS-CoV-2 target nucleic acids are NOT DETECTED. The SARS-CoV-2 RNA is generally detectable in upper and lower respiratory specimens during the acute phase of infection. Negative results do not preclude SARS-CoV-2 infection, do not rule out co-infections with other pathogens, and should not be used as the sole basis for treatment or other patient management decisions. Negative results must be combined with clinical observations, patient history, and epidemiological information. The expected result is Negative. Fact Sheet for Patients: SugarRoll.be Fact Sheet for Healthcare Providers: https://www.woods-mathews.com/ This test is not yet approved or cleared by the [place] FDA and  has been authorized for detection and/or diagnosis of SARS-CoV-2 by FDA under an  Emergency Use Authorization (EUA). This EUA will remain  in effect (meaning this test can be used) for the duration of the COVID-19 declaration under Section 56 4(b)(1) of the Act, 21 U.S.C. section 360bbb-3(b)(1), unless the authorization is terminated or revoked sooner. Performed at [facility], [place] [address], [place], [place] [address]   SARS CORONAVIRUS 2 (TAT 6-24 HRS) Nasopharyngeal Nasopharyngeal Swab     Status: None   Collection Time: 09/25/19  6:01 PM   Specimen: Nasopharyngeal Swab  Result Value Ref Range Status   SARS Coronavirus 2 NEGATIVE NEGATIVE Final    Comment: (NOTE) SARS-CoV-2 target nucleic acids are NOT DETECTED. The SARS-CoV-2 RNA is generally detectable in upper and lower respiratory specimens during the acute phase of infection. Negative results do not preclude SARS-CoV-2 infection, do not rule out co-infections with other pathogens, and should not be used as the sole basis for treatment or other patient management decisions. Negative results must be combined with clinical observations, patient history, and epidemiological information. The expected result is Negative. Fact Sheet for Patients: SugarRoll.be Fact Sheet for Healthcare Providers: https://www.woods-mathews.com/ This test is not yet approved or cleared by the [place] FDA and  has been authorized for detection and/or diagnosis of SARS-CoV-2 by FDA under an Emergency Use Authorization (EUA). This EUA will remain  in effect (meaning this test can be used) for the duration of the COVID-19 declaration under Section 56 4(b)(1) of the Act, 21 U.S.C. section 360bbb-3(b)(1), unless the authorization is terminated or revoked sooner. Performed at [facility], [place] [address] [place], [place] [address]      Labs: Basic Metabolic Panel: Recent Labs  Lab 09/22/19 1515 09/23/19 1603 09/25/19 1600 09/26/19 0629 09/27/19 0537  NA 122* 123*  126* 127* 130*  K 3.6 3.7 3.2* 3.1* 3.3*  CL 89* 90* 93* 96* 97*  CO2 26 22 23 24 23   GLUCOSE 94 63* 94 102* 75  BUN 18 19 19 18 14   CREATININE 1.20 1.21* 1.36* 1.25* 1.14*  CALCIUM 8.6 8.1* 8.3* 8.1* 8.1*  MG  --   --  2.2  --  1.9  PHOS  --   --   --   --  3.3   Liver Function Tests: Recent Labs  Lab 09/23/19 1603 09/25/19 1600 09/26/19 0629 09/27/19 0537  AST 67* 69* 57* 52*  ALT 31 36 30 26  ALKPHOS 135* 135* 121 119  BILITOT 1.6* 1.4* 1.5* 1.6*  PROT 6.7 7.0 5.8* 5.9*  ALBUMIN 2.3* 2.6* 2.2* 2.7*   CBC: Recent Labs  Lab 09/23/19 1603 09/25/19 1600 09/26/19 0629 09/27/19 0537  WBC 7.4 6.3 5.4 5.4  NEUTROABS 4.3 4.1  --   --   HGB 10.0* 10.0* 8.7* 8.4*  HCT 30.6* 31.1* 26.2* 25.8*  MCV 86.4 88.6 87.3 87.2  PLT 206 211 166 139*   CBG: Recent Labs  Lab 09/23/19 1838 09/23/19 2055  GLUCAP 67* 116*   Hgb A1c No results for input(s): HGBA1C in the last 72 hours. Lipid Profile No results for input(s): CHOL, HDL, LDLCALC, TRIG, CHOLHDL, LDLDIRECT in the last 72 hours. Thyroid function studies Recent Labs    09/26/19 0629  TSH 2.175   Urinalysis    Component Value Date/Time   COLORURINE YELLOW 09/03/2019 1637   APPEARANCEUR CLEAR 09/03/2019 1637   LABSPEC 1.003 (L) 09/03/2019 1637   PHURINE 5.0 09/03/2019 1637   GLUCOSEU NEGATIVE 09/03/2019 1637   HGBUR NEGATIVE 09/03/2019 1637   BILIRUBINUR Moderate 09/15/2019 1004   KETONESUR NEGATIVE 09/03/2019 1637   PROTEINUR Positive (A) 09/15/2019 1004   PROTEINUR NEGATIVE 09/03/2019 1637   UROBILINOGEN 1.0 09/15/2019 1004   UROBILINOGEN 0.2 10/18/2017 1536   NITRITE Negative 09/15/2019 1004   NITRITE NEGATIVE 09/03/2019 1637   LEUKOCYTESUR Negative 09/15/2019 1004   LEUKOCYTESUR NEGATIVE 09/03/2019 1637    FURTHER DISCHARGE INSTRUCTIONS:   Get Medicines reviewed and adjusted: Please take all your medications with you for your next visit with your Primary MD   Laboratory/radiological data: Please  request your Primary MD to go over all hospital tests and procedure/radiological results at the follow up, please ask your Primary MD to get all Hospital records sent to his/her office.   In some cases, they will be blood work, cultures and biopsy results pending at the time of your discharge. Please request that your primary care M.D. goes through all the records of your hospital data and follows up on these results.   Also Note the following: If you experience worsening of your admission symptoms, develop shortness of breath, life threatening emergency, suicidal or homicidal thoughts you must seek medical attention immediately by calling 911 or calling your MD immediately  if symptoms less severe.   You must read complete instructions/literature along with all the possible adverse reactions/side effects for all the Medicines  you take and that have been prescribed to you. Take any new Medicines after you have completely understood and accpet all the possible adverse reactions/side effects.    Do not drive when taking Pain medications or sleeping medications (Benzodaizepines)   Do not take more than prescribed Pain, Sleep and Anxiety Medications. It is not advisable to combine anxiety,sleep and pain medications without talking with your primary care practitioner   Special Instructions: If you have smoked or chewed Tobacco  in the last 2 yrs please stop smoking, stop any regular Alcohol  and or any Recreational drug use.   Wear Seat belts while driving.   Please note: You were cared for by a hospitalist during your hospital stay. Once you are discharged, your primary care physician will handle any further medical issues. Please note that NO REFILLS for any discharge medications will be authorized once you are discharged, as it is imperative that you return to your primary care physician (or establish a relationship with a primary care physician if you do not have one) for your post hospital  discharge needs so that they can reassess your need for medications and monitor your lab values.  Time coordinating discharge: 40 minutes  SIGNED:  Marzetta Board, MD, PhD 09/27/2019, 2:51 PM

## 2019-09-27 NOTE — Progress Notes (Signed)
[facility] Progress Note   49 y.o. female with known alcoholic cirrhosis who quit ETOH in 2020 but unfortunately has EV, recurrent ascites requiring LVP, last one just a week ago. She states that she is not on a diuretic. She denied CP/ dyspnea/ abd pain but endorses intermittent nausea. She has gained 13 lbs since the last LVP but denies lower extremity swelling, cough, fever, myalgias. She also denies obstructive like symptoms, dysuria, hematuria, or foamy urine. Of note she signed out early ~3/2-3/3 wanting everything to be done as an outpt; her Na was 123 at time when she left AMA. In the ED this time her Na was actually 126 with a creatinine of 1.36 and alb of 2.6 but at time of consultation the cr had decreased to 1.28. She denies any NSAID and Prozac recently discontinued. use.   Assessment/ Plan:   1. Hyponatremia - hypervolemic variety and treatment is fluid restriction which she was not doing. SHe normally consumes at least 2L per day. I counseled her that at minimum that needs to come down to 1.5L and possibly even to 1.2L. - Urine osmolality I 284 -> not convinced Lasix will help with decreasing  concentration gradient with a osmolality below 600.  - But certainly reasonable to give a trial with Lasix 40mg  PO daily + Klorcon 43meq and liberalizing PO K (encouraged pt to eat foods that have good K content)  OK from renal standpoint to be d/c.   - Tolvaptan theoretically would work well in this pt with basically unregulated vasopressin release but it is very expensive and she is uninsured.  2. AKI resolving but may have baseline CKD from the liver disease, likely at least CKD3 given there is hemodilution being overloaded with cirrhsois. 3. Decompensated cirrhosis s/p LVP today.   Subjective:   Occasional mild nausea. Denies f/c/dyspnea/ cp   Objective:   BP 96/64   Pulse 83   Temp 98.1 F (36.7 C) (Oral)   Resp 16   Wt 64.2 kg   SpO2 97%   BMI 24.29 kg/m    Intake/Output Summary (Last 24 hours) at 09/27/2019 1434 Last data filed at 09/27/2019 1121 Gross per 24 hour  Intake 930 ml  Output --  Net 930 ml   Weight change:   Physical Exam: GEN: NAD, A&Ox3, NCAT HEENT: No conjunctival pallor, EOMI NECK: Supple, no thyromegaly LUNGS: CTA B/L no rales, rhonchi or wheezing CV: RRR, No M/R/G ABD: Distended EXT: Tr lower extremity edema    Imaging: US Paracentesis  Result Date: 09/26/2019 INDICATION: Patient with history of cirrhosis, recurrent ascites. Request is made for diagnostic and therapeutic paracentesis of up to 5 L maximum. EXAM: ULTRASOUND GUIDED PARACENTESIS MEDICATIONS: 10 mL 1% lidocaine COMPLICATIONS: None immediate. PROCEDURE: Informed written consent was obtained from the patient after a discussion of the risks, benefits and alternatives to treatment. A timeout was performed prior to the initiation of the procedure. Initial ultrasound scanning demonstrates a large amount of ascites within the left lateral abdomen. The left lateral abdomen was prepped and draped in the usual sterile fashion. 1% lidocaine was used for local anesthesia. Following this, a 19 gauge, 7-cm, Yueh catheter was introduced. An ultrasound image was saved for documentation purposes. The paracentesis was performed. The catheter was removed and a dressing was applied. The patient tolerated the procedure well without immediate post procedural complication. Patient received post-procedure intravenous albumin; see nursing notes for details. FINDINGS: A total of approximately 5.0 liters of yellow fluid was removed.  Samples were sent to the laboratory as requested by the clinical team. IMPRESSION: Successful ultrasound-guided diagnostic and therapeutic paracentesis yielding 5.0 liters of peritoneal fluid. Read by: Brynda Greathouse PA-C Electronically Signed   By: Sandi Mariscal M.D.   On: 09/26/2019 15:08    Labs: BMET Recent Labs  Lab 09/22/19 1515 09/23/19 1603  09/25/19 1600 09/26/19 0629 09/27/19 0537  NA 122* 123* 126* 127* 130*  K 3.6 3.7 3.2* 3.1* 3.3*  CL 89* 90* 93* 96* 97*  CO2 26 22 23 24 23   GLUCOSE 94 63* 94 102* 75  BUN 18 19 19 18 14   CREATININE 1.20 1.21* 1.36* 1.25* 1.14*  CALCIUM 8.6 8.1* 8.3* 8.1* 8.1*  PHOS  --   --   --   --  3.3   CBC Recent Labs  Lab 09/23/19 1603 09/25/19 1600 09/26/19 0629 09/27/19 0537  WBC 7.4 6.3 5.4 5.4  NEUTROABS 4.3 4.1  --   --   HGB 10.0* 10.0* 8.7* 8.4*  HCT 30.6* 31.1* 26.2* 25.8*  MCV 86.4 88.6 87.3 87.2  PLT 206 211 166 139*    Medications:    . atorvastatin  40 mg Oral Daily  . enoxaparin (LOVENOX) injection  40 mg Subcutaneous Q24H  . FLUoxetine  10 mg Oral Daily  . loratadine  10 mg Oral Daily  . pantoprazole  40 mg Oral BID      Otelia Santee, MD 09/27/2019, 2:34 PM

## 2019-09-28 ENCOUNTER — Telehealth: Payer: Self-pay | Admitting: Family Medicine

## 2019-09-28 ENCOUNTER — Telehealth: Payer: Self-pay | Admitting: Gastroenterology

## 2019-09-28 LAB — PATHOLOGIST SMEAR REVIEW

## 2019-09-28 NOTE — Telephone Encounter (Signed)
The pt has been scheduled to see Dr Tarri Glenn on 4/6 at 850 am for follow up.  Pt aware.

## 2019-09-28 NOTE — Telephone Encounter (Signed)
Patient said she had an iron infusion on Sunday. She has been having nausea, vomiting, dizziness, stomach pain, red flushed skin and feeling faint. Also passing blood in her stools.   Patient advised to go to the ER today  evaluation.

## 2019-09-28 NOTE — Telephone Encounter (Signed)
Pt states she was discharged from the hospital yesterday and was given feraheme prior to going home. Pt reports she had abdominal pain in her liver that then moved to her spleen. Pt reports her body felt warm and she was red, she did not have a fever. She reports she had vomiting and diarrhea also. She wanted to let Dr. Tarri Glenn know.

## 2019-09-28 NOTE — Telephone Encounter (Signed)
She no showed for her last office visit with PG last week. Please reschedule. Thanks.

## 2019-09-30 ENCOUNTER — Ambulatory Visit: Payer: Medicaid Other | Admitting: Gastroenterology

## 2019-10-02 ENCOUNTER — Telehealth: Payer: Self-pay | Admitting: Gastroenterology

## 2019-10-02 ENCOUNTER — Telehealth: Payer: Self-pay | Admitting: Family Medicine

## 2019-10-02 DIAGNOSIS — E871 Hypo-osmolality and hyponatremia: Secondary | ICD-10-CM

## 2019-10-02 DIAGNOSIS — K7031 Alcoholic cirrhosis of liver with ascites: Secondary | ICD-10-CM

## 2019-10-02 DIAGNOSIS — E878 Other disorders of electrolyte and fluid balance, not elsewhere classified: Secondary | ICD-10-CM

## 2019-10-02 NOTE — Telephone Encounter (Signed)
Per Dr. Tarri Glenn patient needs a follow up appointment before 4/6 because she no showed her last appointment with Nevin Bloodgood. She needs a BMP before coming in so that she will be able to address if she needs a refill on meds.   Patient did not answer but I left her a detailed message to go the lab on Monday to have labs done and follow up with Ellouise Newer, PA on 10-06-19 at 2 pm.

## 2019-10-02 NOTE — Telephone Encounter (Signed)
Patient calling- she is asking for refills- she says what she was prescribed for her iron is not working- she is asking for something else. Also, she is requesting a refill for potassium.

## 2019-10-02 NOTE — Telephone Encounter (Signed)
In hospital for a few days and doctors told her to come off zofran. They recommended finigan.Pt wants to see if this can be prescribed.  Can you follow up with pt.

## 2019-10-05 ENCOUNTER — Other Ambulatory Visit (INDEPENDENT_AMBULATORY_CARE_PROVIDER_SITE_OTHER): Payer: Medicaid Other

## 2019-10-05 DIAGNOSIS — E878 Other disorders of electrolyte and fluid balance, not elsewhere classified: Secondary | ICD-10-CM | POA: Diagnosis not present

## 2019-10-05 DIAGNOSIS — E871 Hypo-osmolality and hyponatremia: Secondary | ICD-10-CM | POA: Diagnosis not present

## 2019-10-05 DIAGNOSIS — K7031 Alcoholic cirrhosis of liver with ascites: Secondary | ICD-10-CM | POA: Diagnosis not present

## 2019-10-05 LAB — BASIC METABOLIC PANEL
BUN: 20 mg/dL (ref 6–23)
CO2: 24 mEq/L (ref 19–32)
Calcium: 8.8 mg/dL (ref 8.4–10.5)
Chloride: 100 mEq/L (ref 96–112)
Creatinine, Ser: 1.45 mg/dL — ABNORMAL HIGH (ref 0.40–1.20)
GFR: 38.81 mL/min — ABNORMAL LOW (ref >60.00)
Glucose, Bld: 100 mg/dL — ABNORMAL HIGH (ref 70–99)
Potassium: 4.2 mEq/L (ref 3.5–5.1)
Sodium: 134 mEq/L — ABNORMAL LOW (ref 135–145)

## 2019-10-06 ENCOUNTER — Ambulatory Visit: Payer: Medicaid Other | Admitting: Physician Assistant

## 2019-10-07 ENCOUNTER — Telehealth: Payer: Self-pay

## 2019-10-07 NOTE — Telephone Encounter (Signed)
Mrs. Martindelcampo said that Dr. Radford Pax (the Cardiologist) gave the advice. To stop taking the Zofran.

## 2019-10-07 NOTE — Telephone Encounter (Signed)
Message left for call back with more details for Rx change.

## 2019-10-08 ENCOUNTER — Ambulatory Visit (HOSPITAL_COMMUNITY): Payer: Medicaid Other | Attending: Cardiovascular Disease

## 2019-10-08 ENCOUNTER — Ambulatory Visit (INDEPENDENT_AMBULATORY_CARE_PROVIDER_SITE_OTHER): Payer: Medicaid Other | Admitting: Nurse Practitioner

## 2019-10-08 ENCOUNTER — Encounter: Payer: Self-pay | Admitting: Nurse Practitioner

## 2019-10-08 ENCOUNTER — Other Ambulatory Visit: Payer: Self-pay

## 2019-10-08 VITALS — BP 100/60 | HR 116 | Temp 98.2°F | Ht 64.0 in | Wt 141.0 lb

## 2019-10-08 DIAGNOSIS — R0602 Shortness of breath: Secondary | ICD-10-CM | POA: Insufficient documentation

## 2019-10-08 DIAGNOSIS — R1084 Generalized abdominal pain: Secondary | ICD-10-CM

## 2019-10-08 DIAGNOSIS — R7989 Other specified abnormal findings of blood chemistry: Secondary | ICD-10-CM | POA: Diagnosis not present

## 2019-10-08 DIAGNOSIS — K7031 Alcoholic cirrhosis of liver with ascites: Secondary | ICD-10-CM

## 2019-10-08 LAB — ECHOCARDIOGRAM COMPLETE
Height: 64 in
Weight: 2256 oz

## 2019-10-08 MED ORDER — ALBUMIN HUMAN 25 % IV SOLN: 25.0000 g | Freq: Once | INTRAVENOUS | Status: DC

## 2019-10-08 NOTE — Patient Instructions (Addendum)
If you are age 47 or older, your body mass index should be between 23-30. Your Body mass index is 24.2 kg/m. If this is out of the aforementioned range listed, please consider follow up with your Primary Care Provider.  If you are age 104 or younger, your body mass index should be between 19-25. Your Body mass index is 24.2 kg/m. If this is out of the aformentioned range listed, please consider follow up with your Primary Care Provider.   You have been scheduled for an abdominal paracentesis at [facility] radiology (1st floor of hospital) on 10/12/2019 at 8:45 am. Please arrive at least 15 minutes prior to your appointment time for registration. Should you need to reschedule this appointment for any reason, please call our office at [phone].  Your provider has requested that you go to the basement level for lab work before leaving today. Press "B" on the elevator. The lab is located at the first door on the left as you exit the elevator.  Due to recent changes in healthcare laws, you may see the results of your imaging and laboratory studies on MyChart before your provider has had a chance to review them.  We understand that in some cases there may be results that are confusing or concerning to you. Not all laboratory results come back in the same time frame and the provider may be waiting for multiple results in order to interpret others.  Please give [place] 48 hours in order for your provider to thoroughly review all the results before contacting the office for clarification of your results.   We will send a referral to [facility] to evaluate for Hepatorenal syndrome. They will contact you with an appointment.

## 2019-10-08 NOTE — Progress Notes (Signed)
10/08/2019 Ozie A Keziah [id number] 1970/09/26   Chief Complaint: Abdominal distension, cirrhosis follow up  History of Present Illness: [name] A. Rekowski is a 49 year old female with a past medical history of anxiety, depression, asthma,  alpha 1 antitrypsin deficiency, hypertension, fatty liver disease, alcoholic cirrhosis with acites and esophageal varices. S/P paracentesis 08/20/2019 with 5L peritoneal fluid removed, 09/09/2019 with 9.1 L removed and 09/26/2019 with 5L removed. She remains abstinent from alcohol since the past year. S/P cholecystectomy 2000. She underwent a televisit with Dr. Tarri Glenn on 09/11/2019 for follow up after having a LVP done on 2/17, 9.1L of peritoneal fluid was removed without albumin administration. Spironolactone and Lactulose were held (Lasix was previously discontinued).  Repeat laboratory studies 09/11/2019 showed progressive hyponatremia and renal insufficiency. Sodium 125, potassium 3.7, glucose 120, BUN 17, creatinine 1.55, albumin 3.0, alk phos 152, AST 60, ALT 25.  Total bili 2.4.  Due to her worsening hyponatremia and renal status, she was sent to [facility] ED.  Laboratory studies in the ED showed a WBC 8.4, hemoglobin 10.7, hematocrit 31.9, platelet 186.  Sodium 126, potassium 3.7, BUN 16, creatinine 1.62, alk phos 170, AST 65, ALT 27, total bili 2.04 and albumin 3.1.  Ammonia 90.  She did not meet criteria for hospital admission. She was discharged home with instructions to follow-up with her PCP and Dr. Tarri Glenn.  She was seen by Kathe Becton, NP on 09/15/2019. A cardiology consult was scheduled as she was found to have an abnormal EKG with a prolonged QTC and nonspecific T wave abnormality and complaints of palpitations.   Repeat laboratory studies were done on 09/22/2019 which showed a sodium level of 122 and creatinine 1.20. She was sent directly to [facility] ED on 3/03  for further evaluation as advised by  Dr. Tarri Glenn.  In the ED,  her sodium level was 123.  She was admitted to the observation unit overnight.  She left AMA at approximately 5 AM on 09/24/2019.  The patient called our office later that day requesting a paracentesis and she was advised to go back to the emergency room for hospital admission.  [facility].  Her sodium level was 126, potassium 3.2. BUN 19. Cr. 1.36. Alk phos 135. AST 69. ALT 36. Alk phos 135. T.bili 1.4. WBC 6.3. Hg 10. HCT 31.1. MCV 88.6. PLT 211. INR 1.3.  Urine sodium < 10. Urine osmolality 284.  Dr. Augustin Coupe with nephrology was consulted with the recommendations for a tighter fluid restriction to 1500 cc daily, contnue Lasix 40 mg daily and Spironolactone 55m daily.  She underwent a paracentesis on 09/26/2019 with a removal of 5 L of peritoneal fluid.  No evidence of SBP. She received Feraheme x1 dose on 3/07 then discharged home later that day.  Proximately 7 hours later while at home she developed left upper abdominal pain, she felt warm and flushed then developed vomiting and diarrhea.  She was concerned she had a reaction to the Feraheme infusion.  She does not wish to have a second Feraheme infusion.   She was seen by cardiologist Dr. TGolden Hurteron 09/25/2019.  Her medications including Fluoxetine, Imodium and Zofran possible cause of prolonged QT interval.  Imodium and Zofran were discontinued. She was advised  to follow-up with her psychiatrist to change Fluoxetine to another medication that does not prolong her QT interval.  A repeat EKG is planned in 2 weeks.  If her QTC is still prolonged then possible EP study to be completed.  An echo was scheduled 10/08/2019, results pending.  She presents today for further GI/hepatology follow up. She complains of having worsening abdominal distension with generalized abdominal pain. Some nausea. No further vomiting since 3/7.  She had a panic attack while talking to a client at work a few days  ago. She is concerned about her mentation. She is not confused at this time. She is taking Lactulose one dose daily. She is passing a golden brown soft to loose stool once daily. No rectal bleeding or melena. Appetite is poor. No alcohol use. She is adhering to her 1500cc fluid restriction. Her home weight is stable at 140lbs. No lower extremity edema.  She is urinating darker yellow urine. No hematuria. She has not scheduled a follow up appointment with nephrology. She is taking 2 iron gummies daily as she reported vomiting Ferrous Sulfate po.    CBC Latest Ref Rng & Units 09/27/2019 09/26/2019 09/25/2019  WBC 4.0 - 10.5 K/uL 5.4 5.4 6.3  Hemoglobin 12.0 - 15.0 g/dL 8.4(L) 8.7(L) 10.0(L)  Hematocrit 36.0 - 46.0 % 25.8(L) 26.2(L) 31.1(L)  Platelets 150 - 400 K/uL 139(L) 166 211   CMP Latest Ref Rng & Units 10/05/2019 09/27/2019 09/26/2019  Glucose 70 - 99 mg/dL 100(H) 75 102(H)  BUN 6 - 23 mg/dL '20 14 18  ' Creatinine 0.40 - 1.20 mg/dL 1.45(H) 1.14(H) 1.25(H)  Sodium 135 - 145 mEq/L 134(L) 130(L) 127(L)  Potassium 3.5 - 5.1 mEq/L 4.2 3.3(L) 3.1(L)  Chloride 96 - 112 mEq/L 100 97(L) 96(L)  CO2 19 - 32 mEq/L '24 23 24  ' Calcium 8.4 - 10.5 mg/dL 8.8 8.1(L) 8.1(L)  Total Protein 6.5 - 8.1 g/dL - 5.9(L) 5.8(L)  Total Bilirubin 0.3 - 1.2 mg/dL - 1.6(H) 1.5(H)  Alkaline Phos 38 - 126 U/L - 119 121  AST 15 - 41 U/L - 52(H) 57(H)  ALT 0 - 44 U/L - 26 30    Abdominal sonogram with elastography 08/20/2019: 1. Cirrhosis.  No liver masses. 2. Moderate splenomegaly. 3. Small to moderate volume ascites. 4. Cholecystectomy. Median KPa: 87.9.   04/28/2019 colonoscopy.  For weight loss, abdominal pain.  Nonbleeding external and internal hemorrhoids.  Sessile, 1 mm polyp at descending colon.  Path: underlying lymphoid aggregate, no dysplasia.  04/28/2019 EGD.  For N/V, patient reported 50 pound weight loss, abdominal pain.  Nonbleeding grade 2 distal esophageal varices portal hypertensive gastropathy.  Small HH.  Gastric path: mild reactive gastropathy and mild chronic gastritis, no H. pylori.  Benign, unremarkable duodenal path.    Liver biopsy 2014: moderate steatohepatitis, grade 1-2 inflammation, stage I fibrosis, stains with positive diastase resistant intra cyto-plasmic globules c/w alpha 1 antitrypsin deficiency.  Ascites requiring large-volume paracentesis.  Not on beta-blocker due to progressive renal insufficiency.     Current Outpatient Medications on File Prior to Visit  Medication Sig Dispense Refill  . atorvastatin (LIPITOR) 40 MG tablet Take 1 tablet (40 mg total) by mouth daily. 30 tablet 3  . cetirizine (ZYRTEC) 10 MG tablet Take 1 tablet (10 mg total) by mouth daily. (Patient taking differently: Take 10 mg by mouth at bedtime. ) 30 tablet 11  . CHERRY PO Take 1 tablet by mouth daily.    . hydrOXYzine (ATARAX/VISTARIL) 50 MG tablet Take 50-150 mg by mouth 3 (three) times daily as needed for anxiety or itching.     . Lactulose 20 GM/30ML SOLN Take 30 mLs (20 g total)  by mouth 3 (three) times daily. 450 mL 0  . pantoprazole (PROTONIX) 40 MG tablet Take 1 tablet (40 mg total) by mouth 2 (two) times daily before a meal. (Patient taking differently: Take 40 mg by mouth 2 (two) times daily. ) 180 tablet 3  . Vitamin D, Ergocalciferol, (DRISDOL) 1.25 MG (50000 UNIT) CAPS capsule Take 1 capsule (50,000 Units total) by mouth every 7 (seven) days. 5 capsule 6  . ferrous sulfate (FERROUSUL) 325 (65 FE) MG tablet Take 1 tablet (325 mg total) by mouth every other day. (Patient not taking: Reported on 09/25/2019) 60 tablet 2  . potassium chloride SA (KLOR-CON) 20 MEQ tablet Take 1 tablet (20 mEq total) by mouth daily. (Patient not taking: Reported on 10/08/2019) 10 tablet 0  . spironolactone (ALDACTONE) 25 MG tablet Take 1 tablet (25 mg total) by mouth daily. (Patient not taking: Reported on 10/08/2019) 30 tablet 1   No current facility-administered medications on file prior to visit.   Allergies    Allergen Reactions  . Epinephrine   . Hydrocodone Rash    Current Medications, Allergies, Past Medical History, Past Surgical History, Family History and Social History were reviewed in [employer] record.   Physical Exam: BP 100/60   Pulse (!) 116   Temp 98.2 F (36.8 C)   Ht '5\' 4"'  (1.626 m)   Wt 141 lb (64 kg)   BMI 24.20 kg/m  Weight 09/03/2019: 156lbs.  General: Fatigued appearing female in no acute distress. Head: Normocephalic and atraumatic. Eyes:  Mild scleral icterus. Conjunctiva pink . Ears: Normal auditory acuity. Lungs: Clear throughout to auscultation. Heart: Tachycardic,  no murmur. Abdomen: Distended, + ascites but not tense, central abdomen tympanic to percussion, dull to abdominal borders, epigastric tenderness without rebound or guarding. No HSM appreciated.  Rectal: Deferred.  Musculoskeletal: Symmetrical with no gross deformities. Extremities: No edema. Neurological: Alert oriented x 4. No focal deficits. No asterixis.  Psychological:  Alert and cooperative. Normal mood and affect. Skin: No obvious jaundice.   Assessment and Recommendations:  69. 49 year old female with decompensated alcoholic cirrhosis with ascites and esophageal varices + A1AT deficiency. AST 52. ALT 26. T.bili 1.6. Alk phos 119. Albumin 2.7. INR 1.6.  MELD 15.  -Paracentesis not to exceed 5L. Albumin 25gm IV at time of paracentesis. Labs to include cell count with diff, gram stain, aerobic and anaerobic cultures, albumin, protein and cytology.  -CBC, CMP, PT/INR, Ammonia, Hep B surf ag, Hep B surf ag, Hep B core total, Hep A total Ab and Hep C ab. -If not immune, patient will need Hep A and Hep B vaccinations -Abdominal/pelvic CT with oral contrast only (no IV contrast d/t elevated creatinine) verses Abdominal MRI to be scheduled after paracentesis completed. I will further discuss abdominal imaging with Dr. Tarri Glenn.  -No NSAIDS -Consider TIPS evaluation  -Consider  transplant evaluation   2. Hyponatremia secondary to cirrhosis, improving. Na+ 134 up from 130. Patient remains off diuretics.   3. Increasing creatinine level Cr. 1.45.  Urine sodium < 10.  -Schedule follow up appointment with [facility] asap -Continue fluid restriction as previously ordered by nephrologist Dr. Maudie Mercury for now -Defer diuretic recommendation to nephrology   4.  IDA. Received Feraheme infusion 3/7. Intolerant to Ferrous Sulfate po. No signs of active GI bleeding. Iron 21. TIBC 253. Hg 8.4. HCT 25.8. PLT 139.  -Continue OTC iron gummies for now -CBC -Repeat iron panel in 2 weeks   5.  Hepatic encephalopathy. No  obvious confusion at this time.  Panic attack on 2/16 with altered mentation while at work. -Lactulose 20gm/70m tid not to exceed 3 to 4 BMs daily. May require Xifaxan.

## 2019-10-09 ENCOUNTER — Encounter: Payer: Self-pay | Admitting: Family Medicine

## 2019-10-09 ENCOUNTER — Ambulatory Visit (INDEPENDENT_AMBULATORY_CARE_PROVIDER_SITE_OTHER): Payer: Medicaid Other | Admitting: Family Medicine

## 2019-10-09 ENCOUNTER — Other Ambulatory Visit (INDEPENDENT_AMBULATORY_CARE_PROVIDER_SITE_OTHER): Payer: Medicaid Other

## 2019-10-09 VITALS — BP 127/89 | HR 100 | Temp 98.3°F | Ht 64.0 in | Wt 140.2 lb

## 2019-10-09 DIAGNOSIS — F1011 Alcohol abuse, in remission: Secondary | ICD-10-CM

## 2019-10-09 DIAGNOSIS — K7031 Alcoholic cirrhosis of liver with ascites: Secondary | ICD-10-CM | POA: Diagnosis not present

## 2019-10-09 DIAGNOSIS — K7030 Alcoholic cirrhosis of liver without ascites: Secondary | ICD-10-CM

## 2019-10-09 DIAGNOSIS — I10 Essential (primary) hypertension: Secondary | ICD-10-CM | POA: Diagnosis not present

## 2019-10-09 DIAGNOSIS — I8510 Secondary esophageal varices without bleeding: Secondary | ICD-10-CM

## 2019-10-09 DIAGNOSIS — R1084 Generalized abdominal pain: Secondary | ICD-10-CM | POA: Diagnosis not present

## 2019-10-09 DIAGNOSIS — Z09 Encounter for follow-up examination after completed treatment for conditions other than malignant neoplasm: Secondary | ICD-10-CM

## 2019-10-09 DIAGNOSIS — R7989 Other specified abnormal findings of blood chemistry: Secondary | ICD-10-CM | POA: Diagnosis not present

## 2019-10-09 DIAGNOSIS — E876 Hypokalemia: Secondary | ICD-10-CM

## 2019-10-09 DIAGNOSIS — E871 Hypo-osmolality and hyponatremia: Secondary | ICD-10-CM | POA: Diagnosis not present

## 2019-10-09 DIAGNOSIS — R110 Nausea: Secondary | ICD-10-CM

## 2019-10-09 LAB — POCT URINALYSIS DIPSTICK
Blood, UA: NEGATIVE
Glucose, UA: NEGATIVE
Leukocytes, UA: NEGATIVE
Nitrite, UA: NEGATIVE
Protein, UA: POSITIVE — AB
Spec Grav, UA: >=1.03 — AB (ref 1.010–1.025)
Urobilinogen, UA: 0.2 E.U./dL
pH, UA: 5.5 (ref 5.0–8.0)

## 2019-10-09 LAB — CBC WITH DIFFERENTIAL/PLATELET
Basophils Absolute: 0 10*3/uL (ref 0.0–0.1)
Basophils Relative: 0.4 % (ref 0.0–3.0)
Eosinophils Absolute: 0 10*3/uL (ref 0.0–0.7)
Eosinophils Relative: 0.5 % (ref 0.0–5.0)
HCT: 30.4 % — ABNORMAL LOW (ref 36.0–46.0)
Hemoglobin: 10 g/dL — ABNORMAL LOW (ref 12.0–15.0)
Lymphocytes Relative: 15.3 % (ref 12.0–46.0)
Lymphs Abs: 1.1 10*3/uL (ref 0.7–4.0)
MCHC: 32.8 g/dL (ref 30.0–36.0)
MCV: 91.9 fl (ref 78.0–100.0)
Monocytes Absolute: 0.6 10*3/uL (ref 0.1–1.0)
Monocytes Relative: 8.7 % (ref 3.0–12.0)
Neutro Abs: 5.5 10*3/uL (ref 1.4–7.7)
Neutrophils Relative %: 75.1 % (ref 43.0–77.0)
Platelets: 159 10*3/uL (ref 150.0–400.0)
RBC: 3.31 Mil/uL — ABNORMAL LOW (ref 3.87–5.11)
RDW: 20 % — ABNORMAL HIGH (ref 11.5–15.5)
WBC: 7.3 10*3/uL (ref 4.0–10.5)

## 2019-10-09 LAB — COMPREHENSIVE METABOLIC PANEL
ALT: 30 U/L (ref 0–35)
AST: 59 U/L — ABNORMAL HIGH (ref 0–37)
Albumin: 2.9 g/dL — ABNORMAL LOW (ref 3.5–5.2)
Alkaline Phosphatase: 129 U/L — ABNORMAL HIGH (ref 39–117)
BUN: 17 mg/dL (ref 6–23)
CO2: 22 mEq/L (ref 19–32)
Calcium: 8.4 mg/dL (ref 8.4–10.5)
Chloride: 97 mEq/L (ref 96–112)
Creatinine, Ser: 1.4 mg/dL — ABNORMAL HIGH (ref 0.40–1.20)
GFR: 40.41 mL/min — ABNORMAL LOW (ref >60.00)
Glucose, Bld: 104 mg/dL — ABNORMAL HIGH (ref 70–99)
Potassium: 3.6 mEq/L (ref 3.5–5.1)
Sodium: 129 mEq/L — ABNORMAL LOW (ref 135–145)
Total Bilirubin: 2.1 mg/dL — ABNORMAL HIGH (ref 0.2–1.2)
Total Protein: 7.1 g/dL (ref 6.0–8.3)

## 2019-10-09 LAB — PROTIME-INR
INR: 1.5 ratio — ABNORMAL HIGH (ref 0.8–1.0)
Prothrombin Time: 17 s — ABNORMAL HIGH (ref 9.6–13.1)

## 2019-10-09 LAB — AMMONIA: Ammonia: 33 umol/L (ref 11–35)

## 2019-10-09 MED ORDER — PROMETHAZINE HCL 25 MG PO TABS: 25.0000 mg | ORAL_TABLET | Freq: Three times a day (TID) | ORAL | 0 refills | Status: DC | PRN

## 2019-10-09 NOTE — Patient Instructions (Signed)
Promethazine tablets What is this medicine? PROMETHAZINE (proe METH a zeen) is an antihistamine. It is used to treat allergic reactions and to treat or prevent nausea and vomiting from illness or motion sickness. It is also used to make you sleep before surgery, and to help treat pain or nausea after surgery. This medicine may be used for other purposes; ask your health care provider or pharmacist if you have questions. COMMON BRAND NAME(S): Phenergan What should I tell my health care provider before I take this medicine? They need to know if you have any of these conditions:  blockage in your bowel  diabetes  glaucoma  have trouble controlling your muscles  heart disease  liver disease  low blood counts, like low white cell, platelet, or red cell counts  lung or breathing disease, like asthma  Parkinson's disease  prostate disease  seizures  stomach or intestine problems  trouble passing urine  an unusual or allergic reaction to promethazine, sulfites, other medicines, foods, dyes, or preservatives  pregnant or trying to get pregnant  breast-feeding How should I use this medicine? Take this medicine by mouth with a glass of water. Follow the directions on the prescription label. Take your doses at regular intervals. Do not take your medicine more often than directed. Talk to your pediatrician regarding the use of this medicine in children. Special care may be needed. This medicine should not be given to infants and children younger than 75 years old. Overdosage: If you think you have taken too much of this medicine contact a poison control center or emergency room at once. NOTE: This medicine is only for you. Do not share this medicine with others. What if I miss a dose? If you miss a dose, take it as soon as you can. If it is almost time for your next dose, take only that dose. Do not take double or extra doses. What may interact with this  medicine?  alcohol  antihistamines for allergy, cough, and cold  atropine  certain medicines for anxiety or sleep  certain medicines for bladder problems like oxybutynin, tolterodine  certain medicines for depression like amitriptyline, fluoxetine, sertraline  certain medicines for Parkinson's disease like benztropine, trihexyphenidyl  certain medicines for stomach problems like dicyclomine, hyoscyamine  certain medicines for travel sickness like scopolamine  epinephrine  general anesthetics like halothane, isoflurane, methoxyflurane, propofol  ipratropium  MAOIs like Marplan, Nardil, and Parnate  medicines for high blood pressure  medicines for seizures like phenobarbital, primidone, phenytoin  medicines that relax muscles for surgery  metoclopramide  narcotic medicines for pain This list may not describe all possible interactions. Give your health care provider a list of all the medicines, herbs, non-prescription drugs, or dietary supplements you use. Also tell them if you smoke, drink alcohol, or use illegal drugs. Some items may interact with your medicine. What should I watch for while using this medicine? Visit your health care professional for regular checks on your progress. Tell your health care professional if symptoms do not start to get better or if they get worse. You may get drowsy or dizzy. Do not drive, use machinery, or do anything that needs mental alertness until you know how this medicine affects you. To reduce the risk of dizzy or fainting spells, do not stand or sit up quickly, especially if you are an older patient. Alcohol may increase dizziness and drowsiness. Avoid alcoholic drinks. Your mouth may get dry. Chewing sugarless gum or sucking hard candy, and drinking plenty of  water may help. Contact your doctor if the problem does not go away or is severe. This medicine may cause dry eyes and blurred vision. If you wear contact lenses you may feel some  discomfort. Lubricating drops may help. See your eye doctor if the problem does not go away or is severe. This medicine can make you more sensitive to the sun. Keep out of the sun. If you cannot avoid being in the sun, wear protective clothing and use sunscreen. Do not use sun lamps or tanning beds/booths. This medicine may increase blood sugar. Ask your health care provider if changes in diet or medicines are needed if you have diabetes. What side effects may I notice from receiving this medicine? Side effects that you should report to your doctor or health care professional as soon as possible:  allergic reactions like skin rash, itching or hives, swelling of the face, lips, or tongue  breathing problems  changes in vision  confusion  fever, chills, sore throat  pain, redness, or irritation at site where injected  seizures  signs and symptoms of high blood sugar such as being more thirsty or hungry or having to urinate more than normal. You may also feel very tired or have blurry vision.  signs and symptoms of liver injury like dark yellow or brown urine; general ill feeling or flu-like symptoms; light-colored stools; loss of appetite; nausea; right upper belly pain; unusually weak or tired; yellowing of the eyes or skin  signs and symptoms of low blood pressure like dizziness; feeling faint or lightheaded, falls; unusually weak or tired  trouble passing urine or change in the amount of urine  trouble swallowing  uncontrollable movements of the arms, face, head, mouth, neck, or upper body  unusual bruising or bleeding  unusually weak or tired Side effects that usually do not require medical attention (report to your doctor or health care professional if they continue or are bothersome):  drowsiness  dry mouth  restlessness This list may not describe all possible side effects. Call your doctor for medical advice about side effects. You may report side effects to FDA at  1-800-FDA-1088. Where should I keep my medicine? Keep out of the reach of children. Store at room temperature, between 20 and 25 degrees C (68 and 77 degrees F). Protect from light. Throw away any unused medicine after the expiration date. NOTE: This sheet is a summary. It may not cover all possible information. If you have questions about this medicine, talk to your doctor, pharmacist, or health care provider.  2020 Elsevier/Gold Standard (2019-05-18 13:27:34) Nausea, Adult Nausea is feeling sick to your stomach or feeling that you are about to throw up (vomit). Feeling sick to your stomach is usually not serious, but it may be an early sign of a more serious medical problem. As you feel sicker to your stomach, you may throw up. If you throw up, or if you are not able to drink enough fluids, there is a risk that you may lose too much water in your body (get dehydrated). If you lose too much water in your body, you may:  Feel tired.  Feel thirsty.  Have a dry mouth.  Have cracked lips.  Go pee (urinate) less often. Older adults and people who have other diseases or a weak body defense system (immune system) have a higher risk of losing too much water in the body. The main goals of treating this condition are:  To relieve your nausea.  To ensure  your nausea occurs less often.  To prevent throwing up and losing too much fluid. Follow these instructions at home: Watch your symptoms for any changes. Tell your doctor about them. Follow these instructions as told by your doctor. Eating and drinking      Take an ORS (oral rehydration solution). This is a drink that is sold at pharmacies and stores.  Drink clear fluids in small amounts as you are able. These include: ? Water. ? Ice chips. ? Fruit juice that has water added (diluted fruit juice). ? Low-calorie sports drinks.  Eat bland, easy-to-digest foods in small amounts as you are able, such  as: ? Bananas. ? Applesauce. ? Rice. ? Low-fat (lean) meats. ? Toast. ? Crackers.  Avoid drinking fluids that have a lot of sugar or caffeine in them. This includes energy drinks, sports drinks, and soda.  Avoid alcohol.  Avoid spicy or fatty foods. General instructions  Take over-the-counter and prescription medicines only as told by your doctor.  Rest at home while you get better.  Drink enough fluid to keep your pee (urine) pale yellow.  Take slow and deep breaths when you feel sick to your stomach.  Avoid food or things that have strong smells.  Wash your hands often with soap and water. If you cannot use soap and water, use hand sanitizer.  Make sure that all people in your home wash their hands well and often.  Keep all follow-up visits as told by your doctor. This is important. Contact a doctor if:  You feel sicker to your stomach.  You feel sick to your stomach for more than 2 days.  You throw up.  You are not able to drink fluids without throwing up.  You have new symptoms.  You have a fever.  You have a headache.  You have muscle cramps.  You have a rash.  You have pain while peeing.  You feel light-headed or dizzy. Get help right away if:  You have pain in your chest, neck, arm, or jaw.  You feel very weak or you pass out (faint).  You have throw up that is bright red or looks like coffee grounds.  You have bloody or black poop (stools) or poop that looks like tar.  You have a very bad headache, a stiff neck, or both.  You have very bad pain, cramping, or bloating in your belly (abdomen).  You have trouble breathing or you are breathing very quickly.  Your heart is beating very quickly.  Your skin feels cold and clammy.  You feel confused.  You have signs of losing too much water in your body, such as: ? Dark pee, very little pee, or no pee. ? Cracked lips. ? Dry mouth. ? Sunken eyes. ? Sleepiness. ? Weakness. These symptoms  may be an emergency. Do not wait to see if the symptoms will go away. Get medical help right away. Call your local emergency services (911 in the [place] Do not drive yourself to the hospital. Summary  Nausea is feeling sick to your stomach or feeling that you are about to throw up (vomit).  If you throw up, or if you are not able to drink enough fluids, there is a risk that you may lose too much water in your body (get dehydrated).  Eat and drink what your doctor tells you. Take over-the-counter and prescription medicines only as told by your doctor.  Contact a doctor right away if your symptoms get worse or you have  new symptoms.  Keep all follow-up visits as told by your doctor. This is important. This information is not intended to replace advice given to you by your health care provider. Make sure you discuss any questions you have with your health care provider. Document Revised: 12/17/2017 Document Reviewed: 12/17/2017 Elsevier Patient Education  [place].

## 2019-10-09 NOTE — Progress Notes (Signed)
Patient Kara Torres   Established Patient Office Visit  Subjective:  Patient ID: Kara Torres, female    DOB: 12-29-1970  Age: 49 y.o. MRN: [id number]  CC:  Chief Complaint  Patient presents with  . Hospitalization Follow-up    ED 09/25/2019 & 09/23/2019  Hyponatremia / Ascites  . GI Problem    HPI Kara Torres is a 49 year female who presents for Follow Up.   Past Medical History:  Diagnosis Date  . Alcohol abuse 06/19/2019  . Allergy   . Alpha-1-antitrypsin deficiency ([place])   . Anxiety   . Ascites due to alcoholic cirrhosis ([place])   . Asthma   . Cataract of both eyes 06/19/2019  . Cataracts, bilateral   . Chronic gastritis without bleeding 06/19/2019  . Chronic seasonal allergic rhinitis due to pollen 10/17/2018  . Daytime sleepiness 04/28/2017  . Depression   . Diarrhea 10/17/2018  . Dyslipidemia 07/13/2016  . Elevated serum creatinine 08/2019  . Esophageal varices ([place])   . Esophageal varices in alcoholic cirrhosis ([place]) [id number]  . Essential hypertension 07/13/2016  . Eye exam abnormal 12/10/2018  . Fatty liver disease, nonalcoholic 0000000  . Generalized anxiety disorder 07/13/2016  . History of cholecystectomy   . Hypocalcemia 08/2019  . Hyponatremia 08/2019  . Liver disease   . Nausea and vomiting 10/17/2018  . Obesity 07/13/2016  . Visual disturbance 12/10/2018  . Vitamin D deficiency 06/2019    Current Status: Since her last office visit, she has had several Hospital visit for dehydration, and low sodium. Today she is doing well with no complaints. She denies visual changes, chest pain, cough, shortness of breath, heart palpitations, and falls. She has occasional headaches and dizziness with position changes. Denies severe headaches, confusion, seizures, double vision, and blurred vision, and vomiting. She continues to have support from church members. She is scheduled for paracentesis on Monday. 5 liters  of fluid removed at previous procedure.  She has frequent nausea. Denies current GI problems such as vomiting, diarrhea, and constipation. She has no reports of blood in stools, dysuria and hematuria. Her anxiety is moderate today, as she is currently in the process of applying for FMLA from her employer. She denies suicidal ideations, homicidal ideations, or auditory hallucinations. She denies fevers, chills, fatigue, recent infections, weight loss, and night sweats. She has not had any headaches, visual changes, dizziness, and falls. No chest pain, heart palpitations, cough and shortness of breath reported. She denies pain today.   Past Surgical History:  Procedure Laterality Date  . CHOLECYSTECTOMY  2000  . IR PARACENTESIS  08/20/2019  . IR PARACENTESIS  09/09/2019    Family History  Problem Relation Age of Onset  . Stroke Mother   . Hyperlipidemia Mother   . Diabetes Mother   . Diabetes Father   . Hypertension Father   . Hyperlipidemia Father   . Mental illness Father   . Stroke Father   . Diabetes Maternal Grandmother   . Heart disease Maternal Grandmother   . Hypertension Maternal Grandmother   . Heart attack Maternal Grandmother   . Colon cancer Other        multiple great aunts, great uncles  . Esophageal cancer Neg Hx   . Stomach cancer Neg Hx     Social History   Socioeconomic History  . Marital status: Divorced    Spouse name: Not on file  . Number of children: Not on file  . Years  of education: Not on file  . Highest education level: Not on file  Occupational History  . Occupation: Therapist, art  Tobacco Use  . Smoking status: Former Smoker    Types: Cigarettes  . Smokeless tobacco: Never Used  Substance and Sexual Activity  . Alcohol use: Not Currently    Alcohol/week: 2.0 standard drinks    Types: 2 Cans of beer per week  . Drug use: Yes    Types: Marijuana    Comment: Marijuana edibles  . Sexual activity: Not Currently  Other Topics Concern  . Not  on file  Social History Narrative  . Not on file   Social Determinants of Health   Financial Resource Strain:   . Difficulty of Paying Living Expenses:   Food Insecurity:   . Worried About Charity fundraiser in the Last Year:   . Arboriculturist in the Last Year:   Transportation Needs:   . Film/video editor (Medical):   Marland Kitchen Lack of Transportation (Non-Medical):   Physical Activity:   . Days of Exercise per Week:   . Minutes of Exercise per Session:   Stress:   . Feeling of Stress :   Social Connections:   . Frequency of Communication with Friends and Family:   . Frequency of Social Gatherings with Friends and Family:   . Attends Religious Services:   . Active Member of Clubs or Organizations:   . Attends Archivist Meetings:   Marland Kitchen Marital Status:   Intimate Partner Violence:   . Fear of Current or Ex-Partner:   . Emotionally Abused:   Marland Kitchen Physically Abused:   . Sexually Abused:     Outpatient Medications Prior to Visit  Medication Sig Dispense Refill  . cetirizine (ZYRTEC) 10 MG tablet Take 1 tablet (10 mg total) by mouth daily. (Patient taking differently: Take 10 mg by mouth at bedtime. ) 30 tablet 11  . CHERRY PO Take 1 tablet by mouth daily.    . hydrOXYzine (ATARAX/VISTARIL) 50 MG tablet Take 50-150 mg by mouth 3 (three) times daily as needed for anxiety or itching.     . Lactulose 20 GM/30ML SOLN Take 30 mLs (20 g total) by mouth 3 (three) times daily. 450 mL 0  . pantoprazole (PROTONIX) 40 MG tablet Take 1 tablet (40 mg total) by mouth 2 (two) times daily before a meal. (Patient taking differently: Take 40 mg by mouth 2 (two) times daily. ) 180 tablet 3  . Vitamin D, Ergocalciferol, (DRISDOL) 1.25 MG (50000 UNIT) CAPS capsule Take 1 capsule (50,000 Units total) by mouth every 7 (seven) days. 5 capsule 6  . atorvastatin (LIPITOR) 40 MG tablet Take 1 tablet (40 mg total) by mouth daily. (Patient not taking: Reported on 10/09/2019) 30 tablet 3  . ferrous  sulfate (FERROUSUL) 325 (65 FE) MG tablet Take 1 tablet (325 mg total) by mouth every other day. (Patient not taking: Reported on 09/25/2019) 60 tablet 2  . potassium chloride SA (KLOR-CON) 20 MEQ tablet Take 1 tablet (20 mEq total) by mouth daily. (Patient not taking: Reported on 10/08/2019) 10 tablet 0  . spironolactone (ALDACTONE) 25 MG tablet Take 1 tablet (25 mg total) by mouth daily. (Patient not taking: Reported on 10/09/2019) 30 tablet 1   Facility-Administered Medications Prior to Visit  Medication Dose Route Frequency Provider Last Rate Last Admin  . albumin human 25 % solution 25 g  25 g Intravenous Once Noralyn Pick, NP  Allergies  Allergen Reactions  . Epinephrine   . Hydrocodone Rash    ROS Review of Systems  Constitutional: Negative.   HENT: Negative.   Eyes: Negative.   Respiratory: Negative.   Cardiovascular: Negative.   Gastrointestinal: Positive for abdominal distention (ascites).  Endocrine: Negative.   Genitourinary: Negative.   Musculoskeletal: Negative.   Skin: Negative.   Allergic/Immunologic: Negative.   Neurological: Positive for dizziness (occasional ) and headaches (occasional ).  Hematological: Negative.   Psychiatric/Behavioral: Negative.    Objective:    Physical Exam  Constitutional: She is oriented to person, place, and time. She appears well-developed and well-nourished.  HENT:  Head: Normocephalic and atraumatic.  Eyes: Conjunctivae are normal.  Cardiovascular: Normal rate, regular rhythm and intact distal pulses.  Pulmonary/Chest: Breath sounds normal.  Abdominal: She exhibits distension (ascites).  Musculoskeletal:        General: Normal range of motion.     Cervical back: Normal range of motion and neck supple.  Neurological: She is alert and oriented to person, place, and time. She has normal reflexes.  Skin: Skin is warm and dry.  Psychiatric: She has a normal mood and affect. Her behavior is normal. Judgment and  thought content normal.  Nursing note and vitals reviewed.   BP 127/89   Pulse 100   Temp 98.3 F (36.8 C) (Oral)   Ht 5\' 4"  (1.626 m)   Wt 140 lb 3.2 oz (63.6 kg)   SpO2 100%   BMI 24.07 kg/m  Wt Readings from Last 3 Encounters:  10/09/19 140 lb 3.2 oz (63.6 kg)  10/08/19 141 lb (64 kg)  09/27/19 141 lb 8.6 oz (64.2 kg)     Health Maintenance Due  Topic Date Due  . PAP SMEAR-Modifier  03/17/2019    There are no preventive Torres reminders to display for this patient.  Lab Results  Component Value Date   TSH 2.175 09/26/2019   Lab Results  Component Value Date   WBC 5.4 09/27/2019   HGB 8.4 (L) 09/27/2019   HCT 25.8 (L) 09/27/2019   MCV 87.2 09/27/2019   PLT 139 (L) 09/27/2019   Lab Results  Component Value Date   NA 134 (L) 10/05/2019   K 4.2 10/05/2019   CO2 24 10/05/2019   GLUCOSE 100 (H) 10/05/2019   BUN 20 10/05/2019   CREATININE 1.45 (H) 10/05/2019   BILITOT 1.6 (H) 09/27/2019   ALKPHOS 119 09/27/2019   AST 52 (H) 09/27/2019   ALT 26 09/27/2019   PROT 5.9 (L) 09/27/2019   ALBUMIN 2.7 (L) 09/27/2019   CALCIUM 8.8 10/05/2019   ANIONGAP 10 09/27/2019   GFR 38.81 (L) 10/05/2019   Lab Results  Component Value Date   CHOL 126 06/17/2019   Lab Results  Component Value Date   HDL 34 (L) 06/17/2019   Lab Results  Component Value Date   LDLCALC 77 06/17/2019   Lab Results  Component Value Date   TRIG 76 06/17/2019   Lab Results  Component Value Date   CHOLHDL 3.7 06/17/2019   Lab Results  Component Value Date   HGBA1C 4.3 09/15/2019    Assessment & Plan:   1. Hospital discharge follow-up  2. Hyponatremia  3. Hypokalemia She will continue potassium supplements as prescribed.   4. Ascites due to alcoholic cirrhosis ([facility]) Abdominal girt is increased today. She is scheduled for Paracentesis scheduled for 10/12/2019.    5. Esophageal varices in alcoholic cirrhosis ([facility]) Stable today.   6. History of alcohol  abuse Patient reports no  alcohol use since 04/2019.   7. Essential hypertension The current medical regimen is effective; blood pressure is stable at 127/89 today; continue present plan and medications as prescribed. She will continue to take medications as prescribed, to decrease high sodium intake, excessive alcohol intake, increase potassium intake, smoking cessation, and increase physical activity of at least 30 minutes of cardio activity daily. She will continue to follow Heart Healthy or DASH diet. - POCT urinalysis dipstick  8. Nausea We will initiate promethazine at this time.  - promethazine (PHENERGAN) 25 MG tablet; Take 1 tablet (25 mg total) by mouth every 8 (eight) hours as needed for up to 7 days for nausea or vomiting.  Dispense: 20 tablet; Refill: 0  9. Follow up She will follow up in 1 month.   Problem List Items Addressed This Visit      Cardiovascular and Mediastinum   Esophageal varices in alcoholic cirrhosis ([place])   Essential hypertension   Relevant Orders   POCT urinalysis dipstick (Completed)     Other   Hyponatremia    Other Visit Diagnoses    Hospital discharge follow-up    -  Primary   Hypokalemia       Ascites due to alcoholic cirrhosis ([facility])       History of alcohol abuse       Nausea       Relevant Medications   promethazine (PHENERGAN) 25 MG tablet   Follow up          Meds ordered this encounter  Medications  . promethazine (PHENERGAN) 25 MG tablet    Sig: Take 1 tablet (25 mg total) by mouth every 8 (eight) hours as needed for up to 7 days for nausea or vomiting.    Dispense:  20 tablet    Refill:  0    Follow-up: Return in about 1 month (around 11/09/2019).    Azzie Glatter, FNP

## 2019-10-12 ENCOUNTER — Other Ambulatory Visit: Payer: Self-pay | Admitting: General Surgery

## 2019-10-12 ENCOUNTER — Ambulatory Visit (HOSPITAL_COMMUNITY)
Admission: RE | Admit: 2019-10-12 | Discharge: 2019-10-12 | Disposition: A | Discharge status: Home / Self Care | Payer: Medicaid Other | Source: Ambulatory Visit | Attending: Nurse Practitioner | Admitting: Nurse Practitioner

## 2019-10-12 ENCOUNTER — Other Ambulatory Visit: Payer: Self-pay | Admitting: Gastroenterology

## 2019-10-12 ENCOUNTER — Other Ambulatory Visit: Payer: Self-pay

## 2019-10-12 DIAGNOSIS — R7989 Other specified abnormal findings of blood chemistry: Secondary | ICD-10-CM

## 2019-10-12 DIAGNOSIS — K7031 Alcoholic cirrhosis of liver with ascites: Secondary | ICD-10-CM | POA: Insufficient documentation

## 2019-10-12 HISTORY — PX: IR PARACENTESIS: IMG2679

## 2019-10-12 LAB — HEPATITIS A ANTIBODY, TOTAL: Hepatitis A AB,Total: NONREACTIVE

## 2019-10-12 LAB — BODY FLUID CELL COUNT WITH DIFFERENTIAL
Eos, Fluid: 0 %
Lymphs, Fluid: 58 %
Monocyte-Macrophage-Serous Fluid: 38 % — ABNORMAL LOW (ref 50–90)
Neutrophil Count, Fluid: 4 % (ref 0–25)
Total Nucleated Cell Count, Fluid: 171 cu mm (ref 0–1000)

## 2019-10-12 LAB — ALBUMIN, PLEURAL OR PERITONEAL FLUID: Albumin, Fluid: <1 g/dL

## 2019-10-12 LAB — HEPATITIS C ANTIBODY
Hepatitis C Ab: NONREACTIVE
SIGNAL TO CUT-OFF: 0.02

## 2019-10-12 LAB — HEPATITIS B SURFACE ANTIGEN: Hepatitis B Surface Ag: NONREACTIVE

## 2019-10-12 LAB — HEPATITIS B SURFACE ANTIBODY,QUALITATIVE: Hep B S Ab: NONREACTIVE

## 2019-10-12 LAB — GRAM STAIN

## 2019-10-12 LAB — PROTEIN, PLEURAL OR PERITONEAL FLUID: Total protein, fluid: <3 g/dL

## 2019-10-12 MED ORDER — LIDOCAINE HCL 1 % IJ SOLN
INTRAMUSCULAR | Status: AC
  Filled 2019-10-12: qty 20

## 2019-10-12 MED ORDER — ALBUMIN HUMAN 25 % IV SOLN
INTRAVENOUS | Status: AC
  Filled 2019-10-12: qty 200

## 2019-10-12 MED ORDER — ALBUMIN HUMAN 25 % IV SOLN
50.0000 g | Freq: Once | INTRAVENOUS | Status: AC
  Administered 2019-10-12: 50 g via INTRAVENOUS

## 2019-10-12 MED ORDER — LIDOCAINE HCL (PF) 1 % IJ SOLN
INTRAMUSCULAR | Status: DC | PRN
  Administered 2019-10-12: 10 mL

## 2019-10-12 NOTE — Procedures (Signed)
PROCEDURE SUMMARY:  Successful US guided paracentesis from right lateral abdomen.  Yielded 5.0 liters of yellow fluid.  No immediate complications.  Pt tolerated well.   Specimen was sent for labs.  EBL < 58mL  Docia Barrier PA-C 10/12/2019 9:36 AM

## 2019-10-12 NOTE — Progress Notes (Signed)
Referral sheet faxed over by [facility] at [facility]. Completed the form and faxed back all records they required to evaluate the file to schedule the patient for an appointment.

## 2019-10-13 ENCOUNTER — Encounter: Payer: Self-pay | Admitting: Family Medicine

## 2019-10-13 ENCOUNTER — Telehealth: Payer: Self-pay | Admitting: Family Medicine

## 2019-10-13 LAB — CYTOLOGY - NON PAP

## 2019-10-13 NOTE — Telephone Encounter (Signed)
Signed & completed [place] forms received -on my desk today. Forms have been faxed to [phone]. Message left on patient's voicemail.

## 2019-10-15 NOTE — Progress Notes (Signed)
Challenging management options for ascites given intolerance to diuretics. Referral to transplant center to establish care is of utmost priority.   Josilynn Losh L. Tarri Glenn, MD, MPH

## 2019-10-17 LAB — CULTURE, BODY FLUID W GRAM STAIN -BOTTLE: Culture: NO GROWTH

## 2019-10-19 ENCOUNTER — Telehealth: Payer: Self-pay | Admitting: Nurse Practitioner

## 2019-10-19 DIAGNOSIS — K7031 Alcoholic cirrhosis of liver with ascites: Secondary | ICD-10-CM

## 2019-10-19 DIAGNOSIS — R1012 Left upper quadrant pain: Secondary | ICD-10-CM

## 2019-10-19 DIAGNOSIS — R1011 Right upper quadrant pain: Secondary | ICD-10-CM

## 2019-10-19 DIAGNOSIS — I8510 Secondary esophageal varices without bleeding: Secondary | ICD-10-CM

## 2019-10-19 NOTE — Telephone Encounter (Signed)
Agree, pls send pt to lab for CBC, hepatic panel, BMP and PT/INR.  Pls ask patient to check her weight and report reading to you. Patient has appt with Dr. Tarri Glenn next week. thx

## 2019-10-19 NOTE — Telephone Encounter (Signed)
Please review previous message and advise 

## 2019-10-19 NOTE — Telephone Encounter (Signed)
Patient returned call to the office-patient reports her weight started at 140 lbs, had the paracentesis took off 5 liters (10 lbs)=129 lbs, weight today 133; patient reports she will complete lab work as soon as she is able-does not have a car to use at this time  =stomach is "hard as a rock";  has been taking the Lactulose TID-it is "working really well"; "I am still not voiding like I should"- Patient denies fever/SHOB/chills/nausea/vomiting;  Please advise of next step as patient is requesting to have another paracentesis as soon as she can be scheduled due to abdominal distention

## 2019-10-19 NOTE — Progress Notes (Deleted)
Cardiology Office Note    Date:  10/19/2019   ID:  Kara Torres, DOB 05/03/1971, MRN [id number]  PCP:  Azzie Glatter, FNP  Cardiologist: No primary care provider on file. EPS: None  No chief complaint on file.   History of Present Illness:  Kara Torres is a 49 y.o. female with a hx of ETOH abuse, alcoholic cirrhosis, alpha 1 antitrypsin def, depression, esophageal varices and HTN.  Patient was admitted to the hospital 09/23/2019 with acute on chronic hyponatremia with sodium of 122 (baseline sodium 1 25-1 28).  Patient signed out AMA.  She is high Dr. Radford Pax 09/25/2019 for palpitations and abnormal EKG with a prolonged QTC of 529 ms with low voltage and nonspecific T wave abnormality.  She was on fluoxetine, Imodium and Zofran which all prolong QT interval.  Imodium and Zofran were stopped.  She was to discuss with her psychiatrist changing fluoxetine to another medication that does not prolong QT.  If QTC still prolonged after 2 weeks refer to EP.  She has dyspnea on exertion she attributed to her marked ascites.  She had recently had 9 L pulled off via paracentesis and lost 20 pounds but gained 15 pounds back.  2D echo 10/08/2019 normal LVEF 60 to 65% with normal right heart.  Patient went back to the hospital 09/25/2019 and sodium was up to 126.  Nephrology recommended tighter fluid restriction to 1500 cc daily sodium 138 discharge.  She had another 5 L removed via paracentesis nephrology recommended Lasix 40 mg daily and spironolactone paracentesis of 5 L 10/12/2019     Past Medical History:  Diagnosis Date  . Alcohol abuse 06/19/2019  . Allergy   . Alpha-1-antitrypsin deficiency ([place])   . Anxiety   . Ascites due to alcoholic cirrhosis ([place])   . Asthma   . Cataract of both eyes 06/19/2019  . Cataracts, bilateral   . Chronic gastritis without bleeding 06/19/2019  . Chronic seasonal allergic rhinitis due to pollen 10/17/2018  . Daytime sleepiness 04/28/2017  .  Depression   . Diarrhea 10/17/2018  . Dyslipidemia 07/13/2016  . Elevated serum creatinine 08/2019  . Esophageal varices ([place])   . Esophageal varices in alcoholic cirrhosis ([place]) [id number]  . Essential hypertension 07/13/2016  . Eye exam abnormal 12/10/2018  . Fatty liver disease, nonalcoholic 0000000  . Generalized anxiety disorder 07/13/2016  . History of cholecystectomy   . Hypocalcemia 08/2019  . Hyponatremia 08/2019  . Liver disease   . Nausea and vomiting 10/17/2018  . Obesity 07/13/2016  . Visual disturbance 12/10/2018  . Vitamin D deficiency 06/2019    Past Surgical History:  Procedure Laterality Date  . CHOLECYSTECTOMY  2000  . IR PARACENTESIS  08/20/2019  . IR PARACENTESIS  09/09/2019  . IR PARACENTESIS  10/12/2019    Current Medications: No outpatient medications have been marked as taking for the 10/28/19 encounter (Appointment) with Imogene Burn, PA-C.   Current Facility-Administered Medications for the 10/28/19 encounter (Appointment) with Imogene Burn, PA-C  Medication  . albumin human 25 % solution 25 g     Allergies:   Epinephrine and Hydrocodone   Social History   Socioeconomic History  . Marital status: Divorced    Spouse name: Not on file  . Number of children: Not on file  . Years of education: Not on file  . Highest education level: Not on file  Occupational History  . Occupation: Therapist, art  Tobacco Use  . Smoking status: Former Smoker  Types: Cigarettes  . Smokeless tobacco: Never Used  Substance and Sexual Activity  . Alcohol use: Not Currently    Alcohol/week: 2.0 standard drinks    Types: 2 Cans of beer per week  . Drug use: Yes    Types: Marijuana    Comment: Marijuana edibles  . Sexual activity: Not Currently  Other Topics Concern  . Not on file  Social History Narrative  . Not on file   Social Determinants of Health   Financial Resource Strain:   . Difficulty of Paying Living Expenses:   Food Insecurity:   .  Worried About Charity fundraiser in the Last Year:   . Arboriculturist in the Last Year:   Transportation Needs:   . Film/video editor (Medical):   Marland Kitchen Lack of Transportation (Non-Medical):   Physical Activity:   . Days of Exercise per Week:   . Minutes of Exercise per Session:   Stress:   . Feeling of Stress :   Social Connections:   . Frequency of Communication with Friends and Family:   . Frequency of Social Gatherings with Friends and Family:   . Attends Religious Services:   . Active Member of Clubs or Organizations:   . Attends Archivist Meetings:   Marland Kitchen Marital Status:      Family History:  The patient's ***family history includes Colon cancer in an other family member; Diabetes in her father, maternal grandmother, and mother; Heart attack in her maternal grandmother; Heart disease in her maternal grandmother; Hyperlipidemia in her father and mother; Hypertension in her father and maternal grandmother; Mental illness in her father; Stroke in her father and mother.   ROS:   Please see the history of present illness.    ROS All other systems reviewed and are negative.   PHYSICAL EXAM:   VS:  There were no vitals taken for this visit.  Physical Exam  GEN: Well nourished, well developed, in no acute distress  HEENT: normal  Neck: no JVD, carotid bruits, or masses Cardiac:RRR; no murmurs, rubs, or gallops  Respiratory:  clear to auscultation bilaterally, normal work of breathing GI: soft, nontender, nondistended, + BS Ext: without cyanosis, clubbing, or edema, Good distal pulses bilaterally MS: no deformity or atrophy  Skin: warm and dry, no rash Neuro:  Alert and Oriented x 3, Strength and sensation are intact Psych: euthymic mood, full affect  Wt Readings from Last 3 Encounters:  10/09/19 140 lb 3.2 oz (63.6 kg)  10/08/19 141 lb (64 kg)  09/27/19 141 lb 8.6 oz (64.2 kg)      Studies/Labs Reviewed:   EKG:  EKG is*** ordered today.  The ekg ordered  today demonstrates ***  Recent Labs: 09/03/2019: B Natriuretic Peptide 91.4 09/26/2019: TSH 2.175 09/27/2019: Magnesium 1.9 10/09/2019: ALT 30; BUN 17; Creatinine, Ser 1.40; Hemoglobin 10.0; Platelets 159.0; Potassium 3.6; Sodium 129   Lipid Panel    Component Value Date/Time   CHOL 126 06/17/2019 0837   TRIG 76 06/17/2019 0837   HDL 34 (L) 06/17/2019 0837   CHOLHDL 3.7 06/17/2019 0837   CHOLHDL 3.8 11/02/2016 0804   VLDL 26 11/02/2016 0804   LDLCALC 77 06/17/2019 0837    Additional studies/ records that were reviewed today include:  2D echo 3/18/2021IMPRESSIONS     1. Left ventricular ejection fraction, by estimation, is 60 to 65%. The  left ventricle has normal function. The left ventricle has no regional  wall motion abnormalities. Left ventricular diastolic parameters  were  normal.   2. Right ventricular systolic function is normal. The right ventricular  size is normal.   3. The mitral valve is normal in structure. No evidence of mitral valve  regurgitation. No evidence of mitral stenosis.   4. The aortic valve is normal in structure. Aortic valve regurgitation is  not visualized. No aortic stenosis is present.   5. The inferior vena cava is normal in size with greater than 50%  respiratory variability, suggesting right atrial pressure of 3 mmHg.   FINDINGS   Left Ventricle: Left ventricular ejection fraction, by estimation, is 60  to 65%. The left ventricle has normal function. The left ventricle has no  regional wall motion abnormalities. The left ventricular internal cavity  size was normal in size. There is   no left ventricular hypertrophy. Left ventricular diastolic parameters  were normal. Indeterminate filling pressures.   Right Ventricle: The right ventricular size is normal. No increase in  right ventricular wall thickness. Right ventricular systolic function is  normal.   Left Atrium: Left atrial size was normal in size.   Right Atrium: Right atrial size  was normal in size.   Pericardium: There is no evidence of pericardial effusion.   Mitral Valve: The mitral valve is normal in structure. Normal mobility of  the mitral valve leaflets. No evidence of mitral valve regurgitation. No  evidence of mitral valve stenosis.   Tricuspid Valve: The tricuspid valve is normal in structure. Tricuspid  valve regurgitation is not demonstrated. No evidence of tricuspid  stenosis.   Aortic Valve: The aortic valve is normal in structure. Aortic valve  regurgitation is not visualized. No aortic stenosis is present.   Pulmonic Valve: The pulmonic valve was normal in structure. Pulmonic valve  regurgitation is not visualized. No evidence of pulmonic stenosis.   Aorta: The aortic root is normal in size and structure.   Venous: The inferior vena cava is normal in size with greater than 50%  respiratory variability, suggesting right atrial pressure of 3 mmHg.   IAS/Shunts: No atrial level shunt detected by color flow Doppler.   Additional Comments: Moderate ascites is present.         ASSESSMENT:    No diagnosis found.   PLAN:  In order of problems listed above:  Prolonged QTC 529 ms-Imodium Zofran and fluoxetine stopped  Essential hypertension  Alcoholic cirrhosis status post multiple paracentesis for removal of fluid for ascites  Esophageal varices    Medication Adjustments/Labs and Tests Ordered: Current medicines are reviewed at length with the patient today.  Concerns regarding medicines are outlined above.  Medication changes, Labs and Tests ordered today are listed in the Patient Instructions below. There are no Patient Instructions on file for this visit.   Sumner Boast, PA-C  10/19/2019 3:48 PM    [facility] [address], [place], [place]  [id number] Phone: [phone]; Fax: [phone]

## 2019-10-19 NOTE — Telephone Encounter (Signed)
Left message for patient to call back to the office;  

## 2019-10-20 ENCOUNTER — Telehealth: Payer: Self-pay | Admitting: Nurse Practitioner

## 2019-10-20 ENCOUNTER — Other Ambulatory Visit (INDEPENDENT_AMBULATORY_CARE_PROVIDER_SITE_OTHER): Payer: Medicaid Other

## 2019-10-20 ENCOUNTER — Other Ambulatory Visit: Payer: Self-pay

## 2019-10-20 DIAGNOSIS — K7030 Alcoholic cirrhosis of liver without ascites: Secondary | ICD-10-CM | POA: Diagnosis not present

## 2019-10-20 DIAGNOSIS — R1011 Right upper quadrant pain: Secondary | ICD-10-CM | POA: Diagnosis not present

## 2019-10-20 DIAGNOSIS — K7031 Alcoholic cirrhosis of liver with ascites: Secondary | ICD-10-CM

## 2019-10-20 DIAGNOSIS — I8510 Secondary esophageal varices without bleeding: Secondary | ICD-10-CM | POA: Diagnosis not present

## 2019-10-20 DIAGNOSIS — R1012 Left upper quadrant pain: Secondary | ICD-10-CM | POA: Diagnosis not present

## 2019-10-20 LAB — HEPATIC FUNCTION PANEL
ALT: 34 U/L (ref 0–35)
AST: 61 U/L — ABNORMAL HIGH (ref 0–37)
Albumin: 3.1 g/dL — ABNORMAL LOW (ref 3.5–5.2)
Alkaline Phosphatase: 138 U/L — ABNORMAL HIGH (ref 39–117)
Bilirubin, Direct: 0.7 mg/dL — ABNORMAL HIGH (ref 0.0–0.3)
Total Bilirubin: 1.6 mg/dL — ABNORMAL HIGH (ref 0.2–1.2)
Total Protein: 7 g/dL (ref 6.0–8.3)

## 2019-10-20 LAB — CBC WITH DIFFERENTIAL/PLATELET
Basophils Absolute: 0 10*3/uL (ref 0.0–0.1)
Basophils Relative: 0.6 % (ref 0.0–3.0)
Eosinophils Absolute: 0.1 10*3/uL (ref 0.0–0.7)
Eosinophils Relative: 1.8 % (ref 0.0–5.0)
HCT: 34 % — ABNORMAL LOW (ref 36.0–46.0)
Hemoglobin: 11.3 g/dL — ABNORMAL LOW (ref 12.0–15.0)
Lymphocytes Relative: 27.7 % (ref 12.0–46.0)
Lymphs Abs: 1.4 10*3/uL (ref 0.7–4.0)
MCHC: 33.1 g/dL (ref 30.0–36.0)
MCV: 93.7 fl (ref 78.0–100.0)
Monocytes Absolute: 0.5 10*3/uL (ref 0.1–1.0)
Monocytes Relative: 9.3 % (ref 3.0–12.0)
Neutro Abs: 3 10*3/uL (ref 1.4–7.7)
Neutrophils Relative %: 60.6 % (ref 43.0–77.0)
Platelets: 158 10*3/uL (ref 150.0–400.0)
RBC: 3.63 Mil/uL — ABNORMAL LOW (ref 3.87–5.11)
RDW: 18.9 % — ABNORMAL HIGH (ref 11.5–15.5)
WBC: 4.9 10*3/uL (ref 4.0–10.5)

## 2019-10-20 LAB — BASIC METABOLIC PANEL
BUN: 22 mg/dL (ref 6–23)
CO2: 22 mEq/L (ref 19–32)
Calcium: 8.9 mg/dL (ref 8.4–10.5)
Chloride: 104 mEq/L (ref 96–112)
Creatinine, Ser: 1.36 mg/dL — ABNORMAL HIGH (ref 0.40–1.20)
GFR: 41.78 mL/min — ABNORMAL LOW (ref >60.00)
Glucose, Bld: 80 mg/dL (ref 70–99)
Potassium: 4.6 mEq/L (ref 3.5–5.1)
Sodium: 133 mEq/L — ABNORMAL LOW (ref 135–145)

## 2019-10-20 LAB — PROTIME-INR
INR: 1.5 ratio — ABNORMAL HIGH (ref 0.8–1.0)
Prothrombin Time: 16.5 s — ABNORMAL HIGH (ref 9.6–13.1)

## 2019-10-20 NOTE — Telephone Encounter (Signed)
Bre, contact patient and I will order paracentesis as soon as I have her repeat labs. Pls contact [facility] to verify if they received prior evaluation referral request. Pls verify if [facility] will see her as she has limited Medicaid. A lot of moving parts here. Let me know outcome. Thx

## 2019-10-20 NOTE — Telephone Encounter (Signed)
Left message for patient to call back to the office;   Order for IR paracentesis has been placed with lab work; IV Albumin order has also been placed in Epic;

## 2019-10-20 NOTE — Telephone Encounter (Signed)
Please see additional charting for information; 

## 2019-10-20 NOTE — Telephone Encounter (Signed)
Bre, ok to set up therapeutic paracentesis this week. Paracentesis to remove no more than 5L. Administer Album 25gm IV at time of paracentesis. Paracentesis labs to include: cell count and diff, gram stain, aerobic and anaerobic culture.  Thx.

## 2019-10-20 NOTE — Telephone Encounter (Signed)
Patient returned call to the office-patient informed of provider's recommendations of having a paracentesis this week- patient is agreeable to plan of care and has been scheduled for paracentesis on 10/22/2019 at 10:00 am, arrival at 9:45 am at the main entrance of [facility]; Patient advised to call back to the office at [phone] should questions/concerns arise;  Patient verbalized understanding of information/instructions;  Patient was also sent a message through [facility] with this information.

## 2019-10-20 NOTE — Telephone Encounter (Signed)
Labs completed-please advise on next orders

## 2019-10-21 NOTE — Telephone Encounter (Signed)
Please see 10/15/2019 telephone note for transplant consult information

## 2019-10-22 ENCOUNTER — Ambulatory Visit (HOSPITAL_COMMUNITY)
Admission: RE | Admit: 2019-10-22 | Discharge: 2019-10-22 | Disposition: A | Discharge status: Home / Self Care | Payer: Medicaid Other | Source: Ambulatory Visit | Attending: Nurse Practitioner | Admitting: Nurse Practitioner

## 2019-10-22 ENCOUNTER — Other Ambulatory Visit: Payer: Self-pay

## 2019-10-22 DIAGNOSIS — K7031 Alcoholic cirrhosis of liver with ascites: Secondary | ICD-10-CM | POA: Diagnosis not present

## 2019-10-22 HISTORY — PX: IR PARACENTESIS: IMG2679

## 2019-10-22 LAB — BODY FLUID CELL COUNT WITH DIFFERENTIAL
Eos, Fluid: 0 %
Lymphs, Fluid: 51 %
Monocyte-Macrophage-Serous Fluid: 46 % — ABNORMAL LOW (ref 50–90)
Neutrophil Count, Fluid: 3 % (ref 0–25)
Total Nucleated Cell Count, Fluid: 62 cu mm (ref 0–1000)

## 2019-10-22 LAB — GRAM STAIN

## 2019-10-22 MED ORDER — LIDOCAINE HCL 1 % IJ SOLN
INTRAMUSCULAR | Status: AC | PRN
  Administered 2019-10-22: 10 mL

## 2019-10-22 MED ORDER — ALBUMIN HUMAN 25 % IV SOLN
INTRAVENOUS | Status: AC
  Filled 2019-10-22: qty 100

## 2019-10-22 MED ORDER — LIDOCAINE HCL 1 % IJ SOLN
INTRAMUSCULAR | Status: AC
  Filled 2019-10-22: qty 20

## 2019-10-22 MED ORDER — ALBUMIN HUMAN 25 % IV SOLN
25.0000 g | Freq: Once | INTRAVENOUS | Status: AC
  Administered 2019-10-22: 25 g via INTRAVENOUS

## 2019-10-23 LAB — PATHOLOGIST SMEAR REVIEW

## 2019-10-26 ENCOUNTER — Other Ambulatory Visit: Payer: Self-pay | Admitting: Gastroenterology

## 2019-10-27 ENCOUNTER — Ambulatory Visit: Payer: Medicaid Other | Admitting: Gastroenterology

## 2019-10-27 LAB — CULTURE, BODY FLUID W GRAM STAIN -BOTTLE: Culture: NO GROWTH

## 2019-10-28 ENCOUNTER — Ambulatory Visit: Payer: Self-pay | Admitting: Physician Assistant

## 2019-10-29 ENCOUNTER — Encounter: Payer: Self-pay | Admitting: Gastroenterology

## 2019-10-29 ENCOUNTER — Ambulatory Visit (INDEPENDENT_AMBULATORY_CARE_PROVIDER_SITE_OTHER): Payer: Medicaid Other | Admitting: Gastroenterology

## 2019-10-29 VITALS — BP 110/60 | HR 98 | Temp 97.5°F | Ht 64.0 in | Wt 129.0 lb

## 2019-10-29 DIAGNOSIS — R1012 Left upper quadrant pain: Secondary | ICD-10-CM

## 2019-10-29 DIAGNOSIS — E871 Hypo-osmolality and hyponatremia: Secondary | ICD-10-CM | POA: Diagnosis not present

## 2019-10-29 DIAGNOSIS — K7031 Alcoholic cirrhosis of liver with ascites: Secondary | ICD-10-CM

## 2019-10-29 DIAGNOSIS — R1011 Right upper quadrant pain: Secondary | ICD-10-CM | POA: Diagnosis not present

## 2019-10-29 DIAGNOSIS — K7030 Alcoholic cirrhosis of liver without ascites: Secondary | ICD-10-CM

## 2019-10-29 DIAGNOSIS — E878 Other disorders of electrolyte and fluid balance, not elsewhere classified: Secondary | ICD-10-CM

## 2019-10-29 DIAGNOSIS — I8510 Secondary esophageal varices without bleeding: Secondary | ICD-10-CM

## 2019-10-29 MED ORDER — LACTULOSE 10 GM/15ML PO SOLN: 20.0000 g | Freq: Three times a day (TID) | ORAL | 0 refills | Status: AC

## 2019-10-29 NOTE — Progress Notes (Signed)
Referring Provider: Azzie Glatter, FNP Primary Care Physician:  Azzie Glatter, FNP   Chief complaint:  Liver disease   IMPRESSION:  Decompensated cirrhosis with a meld score of 19 based on 10/20/2019 labs    - liver biopsy 2014: moderate steatohepatitis, grade 1-2 inflammation, stage I fibrosis,       - stains with positive diastase resistant intra cyto-plasmic globules c/w alpha 1 antitrypsin deficiency Recent progressive hyponatremia Hypokalemia with progressive renal insufficiency    -Received potassium chloride supplementation 09/01/2022 serum sodium of 2.8 Recurrent ascites despite serial LVP's     - recent LVP without fluids or albumin    - intolerant to diuretics Grade 2 esophageal varices on EGD 04/28/2019 without history of bleeding Patient reported cognitive impairment Low vitamin D levels Iron deficiency without anemia Colonoscopy 04/2019 showed internal and external hemorrhoids  Decompensated cirrhosis: Referred to [facility] for transplant evaluation. They require insurance and sobriety prior to seeing her. The patient is aware of their requirements.  We've discussed needs to continue to abstain from all alcohol and to continue to formal alcohol counseling. I discussed goals for working to maximize her nutritional status and avoid further muscle loss. She should be screened for metabolic bone disease when covered by her insurance.  She does not have immunity to HAV or HBV an should receive immunization when she as insurance. I have encouraged her to obtain the Covid vaccine.  Ascites: Her ascites has been difficult to manage.  Intolerant to diuretics. Following a 2000 mg sodium restricted diet. Doppler ultrasound recommended to exclude portal vein thrombus. I would not recommend a TIPS at this time, as I am hopeful that her ascites will improve as portal hypertension improves with abstinence from alcohol and her recent MELD is 19. Serum albumin is high enough that serial  albumin infusions with IV Lasix are unlikely to be helpful. We discussed the risks of serial paracentesis. This is likely our best management strategy as long as she does not have them performed too frequently. To minimize protein losses, fluid shifts, and to preserve renal function, I recommend that she delay paracentesis until she has shortness of breath or extremely tense ascites. We discussed that ascites recurs soon after paracentesis, but, shouldn't be removed again until clinically significant.   Recent hyponatremia:  Need to continue strict dietary sodium and fluid restrictions.  Grade 2 esophageal varices on screening EGD 04/28/2019. Nonselective beta-blocker has not been started due to progressive renal insufficiency.  If she is unable to start a nonselective beta-blocker in the near future may want to consider serial EGDs with band ligation until the varices are obliterated.  Suspected early hepatic encephalopathy:  Continue lactulose. She is unable to afford Xifaxan.     PLAN: - Continue 2g sodium restricted diet, 2 L fluid restriction - Lactulose 20g TID - titrated to have at least 3 bowel movements - No diuretics at this time - CMP in one week - LVP PRN - with goal of no more frequently than every 2 week, with the interval as extended as long as possible - Twinrix and DEXA bone scan when insurance is available - Continue formal alcohol counseling - Encouraged weight-based exercise and weight-lifting, adequate nutrition intake - Follow-up in 4-6 weeks with Quintin Alto or Dr. Tarri Glenn  I spent 60 minutes of time, including in depth chart review, independent review of results as outlined above, communicating results with the patient directly, face-to-face time with the patient, coordinating care, ordering studies and medications  as appropriate, and documentation.  HPI: Kara Torres is a 49 y.o. female with decompensated cirrhosis.  She has recently had difficulty with management of  ascites, intolerance to diuretics, and has required serial LVPs despite reported adherence to sodium and fluid restriction.  There is no history of SBP.  She has known portal hypertensive gastropathy, grade 2 esophageal varices without history of bleeding, and hyponatremia.  There is no history of encephalopathy but the patient has had concerns about cognitive impairment. She does not want the coronavirus vaccine due to religious beliefs.  Awaiting nephrology consultation. Under cardiology consult after an EKG in the ED showed prolonged QTc and nonspecific T wave abnormalities.   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  Having increasing difficulty concentrating at work and completing necessary tasks.   Early arising in the morning, but, she falls back to sleep. Total of 7.5 hours of sleep. Frequent afternoon naps.  No reversal in sleep-wake cycle. No falls. But she feels dizzy.  No driving infractions. No falls.   She has no insurance. Overwhelmed by medical costs and concerned about losing her job. Applied for Medicaid.  Denies any alcohol. No beer, wine, liquor, or non-alcoholic beer since the end of the year - after Christmas but before New Years. Civil Service fast streamer at [employer]. Seems the counselor monthly and the psychistrist at least every 3 months. Strong faith.   Feeling better on Vitamin C and iron gummies. Energy has improved. She is doing yard work.  Finds that her energy has improved.  Psychiatrist increased her hydroxyzine and she finds that her panic attacks have improved.  Early morning rising, then falls back asleep. No daytime sleep although she will lie down on her breaks.  Continues to work from home. Friends and ex-husband are very supportive. Recently moved to [place] and hasn't developed a network of friends there, yet.   Continues to follow a 2000 mg sodium restricted diet.  She has been on tight fluid restriction.   Recent  labs: Labs 04/28/2019: Sodium 137, potassium 3.4, creatinine 0.60, total bilirubin 1.3 Labs 05/28/2019: Iron 28, ferritin 20.9 Labs 06/17/2019: Vitamin D 12.3, vitamin B12 1105, TSH 2.130 Labs 09/02/2019: Sodium 127, potassium 2.8, creatinine 1.14, albumin 2.7, total bilirubin 2.6, hemoglobin 9.6, platelets 166 Labs 09/03/2019: Sodium 131, potassium 3.2, creatinine 1.29, albumin 2.7, total bilirubin 2.5, white count 5.5, hemoglobin 10.3, platelets 169 Labs 10/09/19: Na 129, potassium 3.6, BUN 17, creatinine 1.4, alb 2.9, total bilirubin 2.1, WBC 7.3, hgb 10, platelets 159 Labs 10/20/19: Na 133, potassium 4.6, creatinine 1.36, albumin 3.1, total bilirubin 1.6, hemoglobin 11.3, platelets 158, MCV 93.7, RDW 18.9, INR 1.5 for MELD of 19 Labs 10/22/19: ascites with WBC 62, Culture negative  Recent abdominal imaging: Abdominal ultrasound with elastography 08/20/2019: Cirrhosis with no liver mass, moderate splenomegaly, ascites, prior cholecystectomy Paracentesis 08/20/2019: 5 L removed Paracentesis 09/09/2019: 9.1 L removed Paracentesis 09/26/19: 5L removed Paracentesis 10/12/19: 5L removed Paracentesis 10/22/19: 5L removed  Past Medical History:  Diagnosis Date  . Alcohol abuse 06/19/2019  . Allergy   . Alpha-1-antitrypsin deficiency ([place])   . Anxiety   . Ascites due to alcoholic cirrhosis ([place])   . Asthma   . Cataract of both eyes 06/19/2019  . Cataracts, bilateral   . Chronic gastritis without bleeding 06/19/2019  . Chronic seasonal allergic rhinitis due to pollen 10/17/2018  . Daytime sleepiness 04/28/2017  . Depression   . Diarrhea 10/17/2018  . Dyslipidemia 07/13/2016  . Elevated serum creatinine  08/2019  . Esophageal varices ([place])   . Esophageal varices in alcoholic cirrhosis ([place]) [id number]  . Essential hypertension 07/13/2016  . Eye exam abnormal 12/10/2018  . Fatty liver disease, nonalcoholic 0000000  . Generalized anxiety disorder 07/13/2016  . History of cholecystectomy   .  Hypocalcemia 08/2019  . Hyponatremia 08/2019  . Liver disease   . Nausea and vomiting 10/17/2018  . Obesity 07/13/2016  . Visual disturbance 12/10/2018  . Vitamin D deficiency 06/2019    Past Surgical History:  Procedure Laterality Date  . CHOLECYSTECTOMY  2000  . IR PARACENTESIS  08/20/2019  . IR PARACENTESIS  09/09/2019  . IR PARACENTESIS  10/12/2019  . IR PARACENTESIS  10/22/2019    Current Outpatient Medications  Medication Sig Dispense Refill  . cetirizine (ZYRTEC) 10 MG tablet Take 1 tablet (10 mg total) by mouth daily. (Patient taking differently: Take 10 mg by mouth at bedtime. ) 30 tablet 11  . ferrous sulfate (FERROUSUL) 325 (65 FE) MG tablet Take 1 tablet (325 mg total) by mouth every other day. 60 tablet 2  . hydrOXYzine (ATARAX/VISTARIL) 50 MG tablet Take 50-150 mg by mouth 3 (three) times daily as needed for anxiety or itching.     . lactulose (CHRONULAC) 10 GM/15ML solution TAKE 30 ML BY MOUTH  THREE TIMES DAILY 473 mL 0  . pantoprazole (PROTONIX) 40 MG tablet Take 1 tablet (40 mg total) by mouth 2 (two) times daily before a meal. (Patient taking differently: Take 40 mg by mouth 2 (two) times daily. ) 180 tablet 3  . potassium chloride SA (KLOR-CON) 20 MEQ tablet Take 1 tablet (20 mEq total) by mouth daily. 10 tablet 0  . Vitamin D, Ergocalciferol, (DRISDOL) 1.25 MG (50000 UNIT) CAPS capsule Take 1 capsule (50,000 Units total) by mouth every 7 (seven) days. 5 capsule 6  . promethazine (PHENERGAN) 25 MG tablet Take 1 tablet (25 mg total) by mouth every 8 (eight) hours as needed for up to 7 days for nausea or vomiting. 20 tablet 0   Current Facility-Administered Medications  Medication Dose Route Frequency Provider Last Rate Last Admin  . albumin human 25 % solution 25 g  25 g Intravenous Once Noralyn Pick, NP        Allergies as of 10/29/2019 - Review Complete 10/29/2019  Allergen Reaction Noted  . Epinephrine  12/25/2014  . Hydrocodone Rash 12/25/2014     Family History  Problem Relation Age of Onset  . Stroke Mother   . Hyperlipidemia Mother   . Diabetes Mother   . Diabetes Father   . Hypertension Father   . Hyperlipidemia Father   . Mental illness Father   . Stroke Father   . Diabetes Maternal Grandmother   . Heart disease Maternal Grandmother   . Hypertension Maternal Grandmother   . Heart attack Maternal Grandmother   . Colon cancer Other        multiple great aunts, great uncles  . Esophageal cancer Neg Hx   . Stomach cancer Neg Hx     Social History   Socioeconomic History  . Marital status: Divorced    Spouse name: Not on file  . Number of children: Not on file  . Years of education: Not on file  . Highest education level: Not on file  Occupational History  . Occupation: Therapist, art  Tobacco Use  . Smoking status: Former Smoker    Types: Cigarettes  . Smokeless tobacco: Never Used  Substance and Sexual Activity  .  Alcohol use: Not Currently    Alcohol/week: 2.0 standard drinks    Types: 2 Cans of beer per week  . Drug use: Yes    Types: Marijuana    Comment: Marijuana edibles  . Sexual activity: Not Currently  Other Topics Concern  . Not on file  Social History Narrative  . Not on file   Social Determinants of Health   Financial Resource Strain:   . Difficulty of Paying Living Expenses:   Food Insecurity:   . Worried About Charity fundraiser in the Last Year:   . Arboriculturist in the Last Year:   Transportation Needs:   . Film/video editor (Medical):   Marland Kitchen Lack of Transportation (Non-Medical):   Physical Activity:   . Days of Exercise per Week:   . Minutes of Exercise per Session:   Stress:   . Feeling of Stress :   Social Connections:   . Frequency of Communication with Friends and Family:   . Frequency of Social Gatherings with Friends and Family:   . Attends Religious Services:   . Active Member of Clubs or Organizations:   . Attends Archivist Meetings:   Marland Kitchen  Marital Status:   Intimate Partner Violence:   . Fear of Current or Ex-Partner:   . Emotionally Abused:   Marland Kitchen Physically Abused:   . Sexually Abused:      Physical Exam: General:   Alert, in NAD. No scleral icterus. Makred bilateral temporal wasting. Sarcopenia.  Heart:  Regular rate and rhythm; no murmurs Pulm: Clear anteriorly; no wheezing Abdomen:  Soft. Protuberent. Obvious fluid wave. Nontender. Nondistended. Normal bowel sounds. No rebound or guarding.   LAD: No inguinal or umbilical LAD Extremities:  No peripheral edema. Neurologic:  Alert and  oriented x4;  grossly normal neurologically; no asterixis or clonus. Skin: No jaundice. No palmar erythema or spider angioma.  Psych:  Alert and cooperative. Normal mood and affect.      Studies/Results: No results found.    Jru Pense L. Tarri Glenn, MD, MPH 10/29/2019, 11:29 AM

## 2019-10-29 NOTE — Patient Instructions (Addendum)
Continue 2 gram sodium restricted diet, 2 L fluid restriction.   We have sent the following medications to your pharmacy for you to pick up at your convenience:  Lactulose 20 grams three times a day, titrate it to have at least three bowel movements a day   No diuretics at this time.   Please have lab work done in one week.

## 2019-11-02 ENCOUNTER — Telehealth: Payer: Self-pay | Admitting: Gastroenterology

## 2019-11-02 NOTE — Telephone Encounter (Signed)
Pls call pt, she would like to schedule paracentesis this week.

## 2019-11-02 NOTE — Telephone Encounter (Signed)
Pt called again inquiring about scheduling paracentesis. She would like to have it done this Thursday, April 15 because she is off from work. Pls call her.

## 2019-11-03 ENCOUNTER — Other Ambulatory Visit: Payer: Self-pay

## 2019-11-03 DIAGNOSIS — K7031 Alcoholic cirrhosis of liver with ascites: Secondary | ICD-10-CM

## 2019-11-03 MED ORDER — ALBUMIN HUMAN 25 % IV SOLN: 100.0000 g | Freq: Once | INTRAVENOUS | Status: DC

## 2019-11-03 NOTE — Telephone Encounter (Signed)
Pt requesting LVP. Please advise if ok to use the same orders as below:  Paracentesis to remove no more than 5L. Administer Album 25gm IV at time of paracentesis. Paracentesis labs to include: cell count and diff, gram stain, aerobic and anaerobic culture.

## 2019-11-03 NOTE — Telephone Encounter (Signed)
Paracentesis to remove no more than 10L. Administer Albumin 25% 100 gm IV at time of paracentesis. Paracentesis labs to include: cell count and diff, gram stain, aerobic and anaerobic culture.   Thank you.

## 2019-11-03 NOTE — Telephone Encounter (Signed)
Pt scheduled for IR para at [facility] with orders listed below on 11/05/19@10am , pt to arrive there at 9:45am. Pt aware of appt.

## 2019-11-05 ENCOUNTER — Telehealth: Payer: Self-pay | Admitting: Gastroenterology

## 2019-11-05 ENCOUNTER — Other Ambulatory Visit: Payer: Self-pay

## 2019-11-05 ENCOUNTER — Ambulatory Visit (HOSPITAL_COMMUNITY)
Admission: RE | Admit: 2019-11-05 | Discharge: 2019-11-05 | Disposition: A | Discharge status: Home / Self Care | Payer: Medicaid Other | Source: Ambulatory Visit | Attending: Gastroenterology | Admitting: Gastroenterology

## 2019-11-05 ENCOUNTER — Other Ambulatory Visit (INDEPENDENT_AMBULATORY_CARE_PROVIDER_SITE_OTHER): Payer: Medicaid Other

## 2019-11-05 DIAGNOSIS — I8510 Secondary esophageal varices without bleeding: Secondary | ICD-10-CM

## 2019-11-05 DIAGNOSIS — E871 Hypo-osmolality and hyponatremia: Secondary | ICD-10-CM

## 2019-11-05 DIAGNOSIS — R1011 Right upper quadrant pain: Secondary | ICD-10-CM | POA: Diagnosis not present

## 2019-11-05 DIAGNOSIS — K7030 Alcoholic cirrhosis of liver without ascites: Secondary | ICD-10-CM | POA: Diagnosis not present

## 2019-11-05 DIAGNOSIS — K7031 Alcoholic cirrhosis of liver with ascites: Secondary | ICD-10-CM

## 2019-11-05 DIAGNOSIS — E878 Other disorders of electrolyte and fluid balance, not elsewhere classified: Secondary | ICD-10-CM

## 2019-11-05 DIAGNOSIS — R1012 Left upper quadrant pain: Secondary | ICD-10-CM

## 2019-11-05 HISTORY — PX: IR PARACENTESIS: IMG2679

## 2019-11-05 LAB — COMPREHENSIVE METABOLIC PANEL
ALT: 38 U/L — ABNORMAL HIGH (ref 0–35)
AST: 58 U/L — ABNORMAL HIGH (ref 0–37)
Albumin: 2.9 g/dL — ABNORMAL LOW (ref 3.5–5.2)
Alkaline Phosphatase: 127 U/L — ABNORMAL HIGH (ref 39–117)
BUN: 23 mg/dL (ref 6–23)
CO2: 21 mEq/L (ref 19–32)
Calcium: 8.5 mg/dL (ref 8.4–10.5)
Chloride: 101 mEq/L (ref 96–112)
Creatinine, Ser: 1.68 mg/dL — ABNORMAL HIGH (ref 0.40–1.20)
GFR: 32.73 mL/min — ABNORMAL LOW (ref >60.00)
Glucose, Bld: 118 mg/dL — ABNORMAL HIGH (ref 70–99)
Potassium: 4.2 mEq/L (ref 3.5–5.1)
Sodium: 129 mEq/L — ABNORMAL LOW (ref 135–145)
Total Bilirubin: 1.2 mg/dL (ref 0.2–1.2)
Total Protein: 6.7 g/dL (ref 6.0–8.3)

## 2019-11-05 LAB — BODY FLUID CELL COUNT WITH DIFFERENTIAL
Eos, Fluid: 0 %
Lymphs, Fluid: 81 %
Monocyte-Macrophage-Serous Fluid: 15 % — ABNORMAL LOW (ref 50–90)
Neutrophil Count, Fluid: 4 % (ref 0–25)
Total Nucleated Cell Count, Fluid: 90 cu mm (ref 0–1000)

## 2019-11-05 LAB — GRAM STAIN

## 2019-11-05 MED ORDER — ALBUMIN HUMAN 25 % IV SOLN
INTRAVENOUS | Status: AC
  Filled 2019-11-05: qty 200

## 2019-11-05 MED ORDER — LIDOCAINE HCL (PF) 1 % IJ SOLN
INTRAMUSCULAR | Status: AC | PRN
  Administered 2019-11-05: 10 mL

## 2019-11-05 MED ORDER — ALBUMIN HUMAN 25 % IV SOLN
50.0000 g | Freq: Once | INTRAVENOUS | Status: DC
  Filled 2019-11-05: qty 200

## 2019-11-05 MED ORDER — LIDOCAINE HCL 1 % IJ SOLN
INTRAMUSCULAR | Status: AC
  Filled 2019-11-05: qty 20

## 2019-11-05 MED ORDER — ALBUMIN HUMAN 25 % IV SOLN
50.0000 g | Freq: Once | INTRAVENOUS | Status: AC
  Administered 2019-11-05: 50 g via INTRAVENOUS
  Filled 2019-11-05: qty 200

## 2019-11-05 NOTE — Procedures (Signed)
PROCEDURE SUMMARY:  Successful US guided paracentesis from left lateral abdomen.  Yielded 4.8 liters of yellow fluid.  No immediate complications.  Pt tolerated well.   Specimen was sent for labs. Confirmed with Dr. Tarri Glenn that 100g albumin to be given regardless of amount withdrawn.    EBL < 33mL  Docia Barrier PA-C 11/05/2019 11:13 AM

## 2019-11-05 NOTE — Telephone Encounter (Signed)
Kaysey with [facility] called to inform that pt is having paracentesis at this moment. She can estimate that fluid that will be taken out is going to be between 5 or 6 liters. Order for albumin was sent for 100 gram, she states that he usually administers 100 gram for over 12 liters and 50 grams for less. She wants to make sure that Dr. Tarri Glenn still wants administration of 100 grams. Pls call her.

## 2019-11-05 NOTE — Telephone Encounter (Signed)
See below from Dr. Tarri Glenn:  I specifically ordered the 100gm because her serum albumin is so low. I would like them to give that full dose despite only 5-6 L have been removed. Thank you.   Spoke with Edison Nasuti at [facility] and she is aware.

## 2019-11-06 LAB — PATHOLOGIST SMEAR REVIEW

## 2019-11-09 ENCOUNTER — Ambulatory Visit: Payer: Medicaid Other | Admitting: Family Medicine

## 2019-11-10 LAB — CULTURE, BODY FLUID W GRAM STAIN -BOTTLE: Culture: NO GROWTH

## 2019-11-16 ENCOUNTER — Other Ambulatory Visit: Payer: Self-pay | Admitting: Nephrology

## 2019-11-16 DIAGNOSIS — N1832 Chronic kidney disease, stage 3b: Secondary | ICD-10-CM

## 2019-11-17 ENCOUNTER — Ambulatory Visit (INDEPENDENT_AMBULATORY_CARE_PROVIDER_SITE_OTHER): Payer: Medicaid Other | Admitting: Family Medicine

## 2019-11-17 ENCOUNTER — Other Ambulatory Visit: Payer: Self-pay

## 2019-11-17 ENCOUNTER — Encounter: Payer: Self-pay | Admitting: Family Medicine

## 2019-11-17 VITALS — BP 113/81 | HR 95 | Temp 97.8°F | Ht 64.0 in | Wt 130.6 lb

## 2019-11-17 DIAGNOSIS — K7031 Alcoholic cirrhosis of liver with ascites: Secondary | ICD-10-CM | POA: Diagnosis not present

## 2019-11-17 DIAGNOSIS — F1011 Alcohol abuse, in remission: Secondary | ICD-10-CM | POA: Insufficient documentation

## 2019-11-17 DIAGNOSIS — Z09 Encounter for follow-up examination after completed treatment for conditions other than malignant neoplasm: Secondary | ICD-10-CM

## 2019-11-17 DIAGNOSIS — I10 Essential (primary) hypertension: Secondary | ICD-10-CM | POA: Diagnosis not present

## 2019-11-17 DIAGNOSIS — K7030 Alcoholic cirrhosis of liver without ascites: Secondary | ICD-10-CM | POA: Diagnosis not present

## 2019-11-17 DIAGNOSIS — R042 Hemoptysis: Secondary | ICD-10-CM | POA: Insufficient documentation

## 2019-11-17 DIAGNOSIS — R110 Nausea: Secondary | ICD-10-CM

## 2019-11-17 DIAGNOSIS — I8510 Secondary esophageal varices without bleeding: Secondary | ICD-10-CM

## 2019-11-17 LAB — POCT URINALYSIS DIPSTICK
Blood, UA: NEGATIVE
Glucose, UA: POSITIVE — AB
Ketones, UA: 15
Leukocytes, UA: NEGATIVE
Nitrite, UA: NEGATIVE
Protein, UA: POSITIVE — AB
Spec Grav, UA: 1.025 (ref 1.010–1.025)
Urobilinogen, UA: 0.2 E.U./dL
pH, UA: 6 (ref 5.0–8.0)

## 2019-11-17 NOTE — Progress Notes (Signed)
Patient Kara Torres   Established Patient Office Visit  Subjective:  Patient ID: Kara Torres, female    DOB: [birth year]  Age: 49 y.o. MRN: [id number]  CC:  Chief Complaint  Patient presents with  . Follow-up    HPI Kara Torres is a 49 year old female who presents for Follow Up today.   Past Medical History:  Diagnosis Date  . Alcohol abuse 06/19/2019  . Allergy   . Alpha-1-antitrypsin deficiency ([place])   . Anxiety   . Ascites due to alcoholic cirrhosis ([place])   . Asthma   . Cataract of both eyes 06/19/2019  . Cataracts, bilateral   . Chronic gastritis without bleeding 06/19/2019  . Chronic seasonal allergic rhinitis due to pollen 10/17/2018  . Daytime sleepiness 04/28/2017  . Depression   . Diarrhea 10/17/2018  . Dyslipidemia 07/13/2016  . Elevated serum creatinine 08/2019  . Esophageal varices ([place])   . Esophageal varices in alcoholic cirrhosis ([place]) [id number]  . Essential hypertension 07/13/2016  . Eye exam abnormal 12/10/2018  . Fatty liver disease, nonalcoholic 0000000  . Generalized anxiety disorder 07/13/2016  . History of cholecystectomy   . History of hemoptysis   . Hypocalcemia 08/2019  . Hyponatremia 08/2019  . Liver disease   . Nausea and vomiting 10/17/2018  . Obesity 07/13/2016  . Visual disturbance 12/10/2018  . Vitamin D deficiency 06/2019   Current Status: Since her last office visit, today, she is doing well with no complaints. She continues to follow up with GI frequently as needed with Dr. Tarri Glenn. She has not had any hemoptysis from Esophageal Varices. She recently has initial appointment with Nephrologist. She was recently approved for Medicaid and FMLA. She denies fevers, chills, fatigue, recent infections, weight loss, and night sweats. She has not had any headaches, visual changes, dizziness, and falls. No chest pain, heart palpitations, cough and shortness of breath reported. Denies GI  problems such as nausea, vomiting, diarrhea, and constipation. She has no reports of blood in stools, dysuria and hematuria. Her anxiety is stable today. She denies suicidal ideations, homicidal ideations, or auditory hallucinations. She is taking all medications as prescribed. She denies pain today.   Past Surgical History:  Procedure Laterality Date  . CHOLECYSTECTOMY  2000  . IR PARACENTESIS  08/20/2019  . IR PARACENTESIS  09/09/2019  . IR PARACENTESIS  10/12/2019  . IR PARACENTESIS  10/22/2019  . IR PARACENTESIS  11/05/2019    Family History  Problem Relation Age of Onset  . Stroke Mother   . Hyperlipidemia Mother   . Diabetes Mother   . Diabetes Father   . Hypertension Father   . Hyperlipidemia Father   . Mental illness Father   . Stroke Father   . Diabetes Maternal Grandmother   . Heart disease Maternal Grandmother   . Hypertension Maternal Grandmother   . Heart attack Maternal Grandmother   . Colon cancer Other        multiple great aunts, great uncles  . Esophageal cancer Neg Hx   . Stomach cancer Neg Hx     Social History   Socioeconomic History  . Marital status: Divorced    Spouse name: Not on file  . Number of children: Not on file  . Years of education: Not on file  . Highest education level: Not on file  Occupational History  . Occupation: Therapist, art  Tobacco Use  . Smoking status: Former Smoker    Types: Cigarettes  .  Smokeless tobacco: Never Used  Substance and Sexual Activity  . Alcohol use: Not Currently    Alcohol/week: 2.0 standard drinks    Types: 2 Cans of beer per week  . Drug use: Yes    Types: Marijuana    Comment: Marijuana edibles  . Sexual activity: Not Currently  Other Topics Concern  . Not on file  Social History Narrative  . Not on file   Social Determinants of Health   Financial Resource Strain:   . Difficulty of Paying Living Expenses:   Food Insecurity:   . Worried About Charity fundraiser in the Last Year:   . Arts development officer in the Last Year:   Transportation Needs:   . Film/video editor (Medical):   Marland Kitchen Lack of Transportation (Non-Medical):   Physical Activity:   . Days of Exercise per Week:   . Minutes of Exercise per Session:   Stress:   . Feeling of Stress :   Social Connections:   . Frequency of Communication with Friends and Family:   . Frequency of Social Gatherings with Friends and Family:   . Attends Religious Services:   . Active Member of Clubs or Organizations:   . Attends Archivist Meetings:   Marland Kitchen Marital Status:   Intimate Partner Violence:   . Fear of Current or Ex-Partner:   . Emotionally Abused:   Marland Kitchen Physically Abused:   . Sexually Abused:     Outpatient Medications Prior to Visit  Medication Sig Dispense Refill  . cetirizine (ZYRTEC) 10 MG tablet Take 1 tablet (10 mg total) by mouth daily. (Patient taking differently: Take 10 mg by mouth at bedtime. ) 30 tablet 11  . ferrous sulfate (FERROUSUL) 325 (65 FE) MG tablet Take 1 tablet (325 mg total) by mouth every other day. 60 tablet 2  . hydrOXYzine (ATARAX/VISTARIL) 50 MG tablet Take 50-150 mg by mouth 3 (three) times daily as needed for anxiety or itching.     . lactulose (CHRONULAC) 10 GM/15ML solution Take 30 mLs (20 g total) by mouth 3 (three) times daily. 2700 mL 0  . midodrine (PROAMATINE) 10 MG tablet Take 10 mg by mouth 3 (three) times daily.    . pantoprazole (PROTONIX) 40 MG tablet Take 1 tablet (40 mg total) by mouth 2 (two) times daily before a meal. (Patient taking differently: Take 40 mg by mouth 2 (two) times daily. ) 180 tablet 3  . promethazine (PHENERGAN) 25 MG tablet Take 1 tablet (25 mg total) by mouth every 8 (eight) hours as needed for up to 7 days for nausea or vomiting. 20 tablet 0  . Vitamin D, Ergocalciferol, (DRISDOL) 1.25 MG (50000 UNIT) CAPS capsule Take 1 capsule (50,000 Units total) by mouth every 7 (seven) days. 5 capsule 6  . potassium chloride SA (KLOR-CON) 20 MEQ tablet Take 1  tablet (20 mEq total) by mouth daily. 10 tablet 0  . albumin human 25 % solution 100 g     . albumin human 25 % solution 25 g      No facility-administered medications prior to visit.    Allergies  Allergen Reactions  . Epinephrine   . Hydrocodone Rash    ROS Review of Systems  Constitutional: Negative.   HENT: Negative.   Eyes: Negative.   Respiratory: Negative.   Cardiovascular: Negative.   Gastrointestinal: Positive for abdominal distention.  Endocrine: Negative.   Genitourinary: Negative.   Musculoskeletal: Positive for arthralgias (generalized. ).  Skin: Negative.  Neurological: Positive for dizziness (occasional ) and headaches (occasional ).  Hematological: Negative.   Psychiatric/Behavioral: Negative.    Objective:    Physical Exam  Constitutional:  Possibly undernourished.   HENT:  Head: Normocephalic and atraumatic.  Eyes: Conjunctivae are normal.  Cardiovascular: Normal rate, regular rhythm, normal heart sounds and intact distal pulses.  Pulmonary/Chest: Effort normal and breath sounds normal.  Abdominal: Soft. Bowel sounds are normal. She exhibits distension (ascites r/t liver cirrhosis. ).  Musculoskeletal:        General: Normal range of motion.     Cervical back: Neck supple.  Neurological: She is alert. She has normal reflexes.  Skin: Skin is warm and dry.  Psychiatric: She has a normal mood and affect. Her behavior is normal. Judgment and thought content normal.  Nursing note and vitals reviewed.   BP 113/81   Pulse 95   Temp 97.8 F (36.6 C)   Ht 5\' 4"  (1.626 m)   Wt 130 lb 9.6 oz (59.2 kg)   SpO2 100%   BMI 22.42 kg/m  Wt Readings from Last 3 Encounters:  11/17/19 130 lb 9.6 oz (59.2 kg)  10/29/19 129 lb (58.5 kg)  10/09/19 140 lb 3.2 oz (63.6 kg)     Health Maintenance Due  Topic Date Due  . COVID-19 Vaccine (1) Never done  . PAP SMEAR-Modifier  03/17/2019    There are no preventive Torres reminders to display for this  patient.  Lab Results  Component Value Date   TSH 2.175 09/26/2019   Lab Results  Component Value Date   WBC 4.9 10/20/2019   HGB 11.3 (L) 10/20/2019   HCT 34.0 (L) 10/20/2019   MCV 93.7 10/20/2019   PLT 158.0 10/20/2019   Lab Results  Component Value Date   NA 129 (L) 11/05/2019   K 4.2 11/05/2019   CO2 21 11/05/2019   GLUCOSE 118 (H) 11/05/2019   BUN 23 11/05/2019   CREATININE 1.68 (H) 11/05/2019   BILITOT 1.2 11/05/2019   ALKPHOS 127 (H) 11/05/2019   AST 58 (H) 11/05/2019   ALT 38 (H) 11/05/2019   PROT 6.7 11/05/2019   ALBUMIN 2.9 (L) 11/05/2019   CALCIUM 8.5 11/05/2019   ANIONGAP 10 09/27/2019   GFR 32.73 (L) 11/05/2019   Lab Results  Component Value Date   CHOL 126 06/17/2019   Lab Results  Component Value Date   HDL 34 (L) 06/17/2019   Lab Results  Component Value Date   LDLCALC 77 06/17/2019   Lab Results  Component Value Date   TRIG 76 06/17/2019   Lab Results  Component Value Date   CHOLHDL 3.7 06/17/2019   Lab Results  Component Value Date   HGBA1C 4.3 09/15/2019    Assessment & Plan:   1. Ascites due to alcoholic cirrhosis (HCC) Continue paracentesis as needed.   2. Esophageal varices in alcoholic cirrhosis (HCC) Stable. Monitor.   3. Cough with hemoptysis Stable.   4. History of alcohol abuse She continues to avoid alcohol.   5. Essential hypertension The current medical regimen is effective; she is stable at 113/81 today; continue present plan and medications as prescribed. She will continue to take medications as prescribed, to decrease high sodium intake, excessive alcohol intake, increase potassium intake, smoking cessation, and increase physical activity of at least 30 minutes of cardio activity daily. She will continue to follow Heart Healthy or DASH diet. - POCT urinalysis dipstick  6. Nausea Stable.   7. Follow up She will follow  up in 3 months.   No orders of the defined types were placed in this  encounter.   Referral Orders  No referral(s) requested today    Kathe Becton,  MSN, FNP-BC [address] [place], [place] [address] [phone] fax   Problem List Items Addressed This Visit      Cardiovascular and Mediastinum   Esophageal varices in alcoholic cirrhosis ([place])   Relevant Medications   midodrine (PROAMATINE) 10 MG tablet   Essential hypertension   Relevant Medications   midodrine (PROAMATINE) 10 MG tablet   Other Relevant Orders   POCT urinalysis dipstick (Completed)    Other Visit Diagnoses    Ascites due to alcoholic cirrhosis ([place])    -  Primary   Cough with hemoptysis       History of alcohol abuse       Nausea       Follow up          No orders of the defined types were placed in this encounter.   Follow-up: Return in about 3 months (around 02/16/2020).    Azzie Glatter, FNP

## 2019-11-19 ENCOUNTER — Other Ambulatory Visit: Payer: Self-pay | Admitting: Nephrology

## 2019-11-19 DIAGNOSIS — N179 Acute kidney failure, unspecified: Secondary | ICD-10-CM

## 2019-11-24 ENCOUNTER — Other Ambulatory Visit: Payer: Self-pay

## 2019-11-24 ENCOUNTER — Ambulatory Visit (HOSPITAL_COMMUNITY)
Admission: RE | Admit: 2019-11-24 | Discharge: 2019-11-24 | Disposition: A | Discharge status: Home / Self Care | Payer: Medicaid Other | Source: Ambulatory Visit | Attending: Nephrology | Admitting: Nephrology

## 2019-11-24 DIAGNOSIS — R188 Other ascites: Secondary | ICD-10-CM | POA: Insufficient documentation

## 2019-11-24 DIAGNOSIS — N1832 Chronic kidney disease, stage 3b: Secondary | ICD-10-CM | POA: Insufficient documentation

## 2019-11-24 HISTORY — PX: IR PARACENTESIS: IMG2679

## 2019-11-24 MED ORDER — LIDOCAINE HCL (PF) 1 % IJ SOLN
INTRAMUSCULAR | Status: DC | PRN
  Administered 2019-11-24: 5 mL

## 2019-11-24 MED ORDER — ALBUMIN HUMAN 25 % IV SOLN
INTRAVENOUS | Status: AC
  Administered 2019-11-24: 25 g via INTRAVENOUS
  Filled 2019-11-24: qty 100

## 2019-11-24 MED ORDER — ALBUMIN HUMAN 25 % IV SOLN: 25.0000 g | Freq: Once | INTRAVENOUS | Status: AC

## 2019-11-24 MED ORDER — ALBUMIN HUMAN 25 % IV SOLN: 12.5000 g | Freq: Once | INTRAVENOUS | Status: DC

## 2019-11-24 MED ORDER — LIDOCAINE HCL 1 % IJ SOLN
INTRAMUSCULAR | Status: AC
  Filled 2019-11-24: qty 20

## 2019-11-24 NOTE — Procedures (Signed)
Ultrasound-guided therapeutic paracentesis performed yielding 3.5 liters of straw colored fluid. No immediate complications. EBL is none.

## 2019-11-25 ENCOUNTER — Telehealth: Payer: Self-pay | Admitting: Physician Assistant

## 2019-11-25 ENCOUNTER — Other Ambulatory Visit: Payer: Self-pay | Admitting: Physician Assistant

## 2019-11-25 ENCOUNTER — Inpatient Hospital Stay
Admission: RE | Admit: 2019-11-25 | Discharge: 2019-11-25 | Disposition: A | Discharge status: Home / Self Care | Payer: Medicaid Other | Source: Ambulatory Visit

## 2019-11-25 DIAGNOSIS — K7031 Alcoholic cirrhosis of liver with ascites: Secondary | ICD-10-CM

## 2019-11-25 NOTE — Telephone Encounter (Signed)
  Patient called c/o pain and swelling at the site where she had her paracentesis yesterday.  I spoke with her and she would like to avoid going to the ED due to risk of COVID exposure.  I will order an US of the area to evaluate it. It is scheduled for tomorrow at 11 am.  She is stable, she is not faint or weak.  I did instruct her to use an ice pack and to roll a towel and place it at the site and lay on it.  She understands if she should have worsening symptoms she definitely should go the ED immediately.   Merida Alcantar S Xolani Degracia PA-C 11/25/2019 4:08 PM

## 2019-11-26 ENCOUNTER — Other Ambulatory Visit: Payer: Self-pay

## 2019-11-26 ENCOUNTER — Emergency Department (HOSPITAL_COMMUNITY)
Admission: EM | Admit: 2019-11-26 | Discharge: 2019-11-27 | Disposition: A | Discharge status: Home / Self Care | Payer: Medicaid Other | Attending: Emergency Medicine | Admitting: Emergency Medicine

## 2019-11-26 ENCOUNTER — Telehealth: Payer: Self-pay | Admitting: Gastroenterology

## 2019-11-26 ENCOUNTER — Ambulatory Visit (HOSPITAL_COMMUNITY)
Admission: RE | Admit: 2019-11-26 | Discharge: 2019-11-26 | Disposition: A | Discharge status: Home / Self Care | Payer: Medicaid Other | Source: Ambulatory Visit | Attending: Physician Assistant | Admitting: Physician Assistant

## 2019-11-26 ENCOUNTER — Emergency Department (HOSPITAL_COMMUNITY): Payer: Medicaid Other

## 2019-11-26 ENCOUNTER — Encounter (HOSPITAL_COMMUNITY): Payer: Self-pay | Admitting: Emergency Medicine

## 2019-11-26 DIAGNOSIS — I10 Essential (primary) hypertension: Secondary | ICD-10-CM | POA: Diagnosis not present

## 2019-11-26 DIAGNOSIS — Z79899 Other long term (current) drug therapy: Secondary | ICD-10-CM | POA: Diagnosis not present

## 2019-11-26 DIAGNOSIS — R1084 Generalized abdominal pain: Secondary | ICD-10-CM | POA: Insufficient documentation

## 2019-11-26 DIAGNOSIS — K7031 Alcoholic cirrhosis of liver with ascites: Secondary | ICD-10-CM | POA: Diagnosis present

## 2019-11-26 DIAGNOSIS — R0602 Shortness of breath: Secondary | ICD-10-CM | POA: Insufficient documentation

## 2019-11-26 DIAGNOSIS — F1210 Cannabis abuse, uncomplicated: Secondary | ICD-10-CM | POA: Diagnosis not present

## 2019-11-26 DIAGNOSIS — Z87891 Personal history of nicotine dependence: Secondary | ICD-10-CM | POA: Diagnosis not present

## 2019-11-26 DIAGNOSIS — R109 Unspecified abdominal pain: Secondary | ICD-10-CM | POA: Diagnosis present

## 2019-11-26 DIAGNOSIS — R2243 Localized swelling, mass and lump, lower limb, bilateral: Secondary | ICD-10-CM | POA: Diagnosis not present

## 2019-11-26 LAB — URINALYSIS, ROUTINE W REFLEX MICROSCOPIC
Bilirubin Urine: NEGATIVE
Glucose, UA: NEGATIVE mg/dL
Hgb urine dipstick: NEGATIVE
Ketones, ur: NEGATIVE mg/dL
Leukocytes,Ua: NEGATIVE
Nitrite: NEGATIVE
Protein, ur: NEGATIVE mg/dL
Specific Gravity, Urine: 1.008 (ref 1.005–1.030)
pH: 6 (ref 5.0–8.0)

## 2019-11-26 LAB — CBC
HCT: 33.5 % — ABNORMAL LOW (ref 36.0–46.0)
Hemoglobin: 10.6 g/dL — ABNORMAL LOW (ref 12.0–15.0)
MCH: 30.4 pg (ref 26.0–34.0)
MCHC: 31.6 g/dL (ref 30.0–36.0)
MCV: 96 fL (ref 80.0–100.0)
Platelets: 127 10*3/uL — ABNORMAL LOW (ref 150–400)
RBC: 3.49 MIL/uL — ABNORMAL LOW (ref 3.87–5.11)
RDW: 15.3 % (ref 11.5–15.5)
WBC: 4.3 10*3/uL (ref 4.0–10.5)
nRBC: 0 % (ref 0.0–0.2)

## 2019-11-26 LAB — COMPREHENSIVE METABOLIC PANEL
ALT: 31 U/L (ref 0–44)
AST: 56 U/L — ABNORMAL HIGH (ref 15–41)
Albumin: 3.1 g/dL — ABNORMAL LOW (ref 3.5–5.0)
Alkaline Phosphatase: 79 U/L (ref 38–126)
Anion gap: 8 (ref 5–15)
BUN: 15 mg/dL (ref 6–20)
CO2: 25 mmol/L (ref 22–32)
Calcium: 8.7 mg/dL — ABNORMAL LOW (ref 8.9–10.3)
Chloride: 99 mmol/L (ref 98–111)
Creatinine, Ser: 1.84 mg/dL — ABNORMAL HIGH (ref 0.44–1.00)
GFR calc Af Amer: 37 mL/min — ABNORMAL LOW (ref >60)
GFR calc non Af Amer: 32 mL/min — ABNORMAL LOW (ref >60)
Glucose, Bld: 89 mg/dL (ref 70–99)
Potassium: 4.7 mmol/L (ref 3.5–5.1)
Sodium: 132 mmol/L — ABNORMAL LOW (ref 135–145)
Total Bilirubin: 1.3 mg/dL — ABNORMAL HIGH (ref 0.3–1.2)
Total Protein: 6.5 g/dL (ref 6.5–8.1)

## 2019-11-26 LAB — PROTIME-INR
INR: 1.4 — ABNORMAL HIGH (ref 0.8–1.2)
Prothrombin Time: 16.5 seconds — ABNORMAL HIGH (ref 11.4–15.2)

## 2019-11-26 LAB — I-STAT BETA HCG BLOOD, ED (MC, WL, AP ONLY): I-stat hCG, quantitative: <5 m[IU]/mL (ref <5)

## 2019-11-26 LAB — LIPASE, BLOOD: Lipase: 36 U/L (ref 11–51)

## 2019-11-26 MED ORDER — LACTULOSE 20 GM/30ML PO SOLN: 20.0000 g | Freq: Three times a day (TID) | ORAL | 6 refills | Status: AC

## 2019-11-26 MED ORDER — LIDOCAINE HCL (PF) 1 % IJ SOLN
5.0000 mL | Freq: Once | INTRAMUSCULAR | Status: AC
  Administered 2019-11-27: 5 mL via INTRADERMAL
  Filled 2019-11-26: qty 5

## 2019-11-26 MED ORDER — IOHEXOL 300 MG/ML  SOLN
100.0000 mL | Freq: Once | INTRAMUSCULAR | Status: AC | PRN
  Administered 2019-11-26: 100 mL via INTRAVENOUS

## 2019-11-26 MED ORDER — SODIUM CHLORIDE 0.9% FLUSH: 3.0000 mL | Freq: Once | INTRAVENOUS | Status: DC

## 2019-11-26 NOTE — ED Triage Notes (Addendum)
Pt reports paracentesis 2 days ago, now having pain around site of procedure, spoke with a PA who scheduled her to have an [place] this morning.States she had an [place] of her abd prior to arrival but was told that it looked fine. She states that her stomach is more swollen than it has been, denies n/v/d. Does report she was told she had peritonitis.

## 2019-11-26 NOTE — ED Provider Notes (Signed)
[facility] EMERGENCY DEPARTMENT Provider Note   CSN: [id number] Arrival date & time: 11/26/19  1147     History Chief Complaint  Patient presents with  . Abdominal Pain    Kara Torres is a 49 y.o. female.  HPI Patient is a 49 year old female who presents for painful abdominal swelling, nausea, leg swelling, shortness of breath.  Patient has cirrhosis and undergoes therapeutic paracenteses.  In the past, she has had up to 9 L drained in one session.  2 days ago, she had 3 L taken off.  She was given 2 vials of albumin.  Since that procedure, she has had right-sided abdominal pain and swelling.  Today, abdominal pain and swelling has become diffuse.  She has had shortness of breath that she attributes to the increased swelling in her abdomen as well as anxiety about something being wrong.  She has increased swelling in her legs.  She has had nausea.  She has not vomited.  She has baseline constipation.  She has not had fevers.  She chronically feels cold.  She has not had any new onset episodes of chills or diaphoresis.  She underwent an ultrasound today of the paracentesis site.  She was told that results were normal.  Her gastroenterologist, Dr. Tarri Glenn, is on-call today.  She called the office and was told to come into the ED.     Past Medical History:  Diagnosis Date  . Alcohol abuse 06/19/2019  . Allergy   . Alpha-1-antitrypsin deficiency ([place])   . Anxiety   . Ascites due to alcoholic cirrhosis ([place])   . Asthma   . Cataract of both eyes 06/19/2019  . Cataracts, bilateral   . Chronic gastritis without bleeding 06/19/2019  . Chronic seasonal allergic rhinitis due to pollen 10/17/2018  . Daytime sleepiness 04/28/2017  . Depression   . Diarrhea 10/17/2018  . Dyslipidemia 07/13/2016  . Elevated serum creatinine 08/2019  . Esophageal varices ([place])   . 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 Essential hypertension 07/13/2016  . Eye exam  abnormal 12/10/2018  . Fatty liver disease, nonalcoholic 0000000  . Generalized anxiety disorder 07/13/2016  . History of cholecystectomy   . History of hemoptysis   . Hypocalcemia 08/2019  . Hyponatremia 08/2019  . Liver disease   . Nausea and vomiting 10/17/2018  . Obesity 07/13/2016  . Visual disturbance 12/10/2018  . Vitamin D deficiency 06/2019    Patient Active Problem List   Diagnosis Date Noted  . History of alcohol abuse 11/17/2019  . Cough with hemoptysis 11/17/2019  . Ascites due to alcoholic cirrhosis ([place]) [id number]  . GERD (gastroesophageal reflux disease) 09/24/2019  . Depression 09/24/2019  . Hyponatremia 09/23/2019  . Cataract of both eyes 06/19/2019  . Chronic gastritis without bleeding 06/19/2019  . 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 Alcohol abuse 06/19/2019  . Visual disturbance 12/10/2018  . Eye exam abnormal 12/10/2018  . Nausea and vomiting 10/17/2018  . Abdominal pain, bilateral upper quadrant 10/17/2018  . Diarrhea 10/17/2018  . Chronic seasonal allergic rhinitis due to pollen 10/17/2018  . Daytime sleepiness 04/28/2017  . Essential hypertension 07/13/2016  . Obesity 07/13/2016  . Dyslipidemia 07/13/2016  . Fatty liver disease, nonalcoholic 0000000  . Generalized anxiety disorder 07/13/2016    Past Surgical History:  Procedure Laterality Date  . CHOLECYSTECTOMY  2000  . IR PARACENTESIS  08/20/2019  . IR PARACENTESIS  09/09/2019  . IR PARACENTESIS  10/12/2019  . IR  PARACENTESIS  10/22/2019  . IR PARACENTESIS  11/05/2019  . IR PARACENTESIS  11/24/2019     OB History    Gravida  0   Para  0   Term  0   Preterm  0   AB  0   Living  0     SAB  0   TAB  0   Ectopic  0   Multiple  0   Live Births  0           Family History  Problem Relation Age of Onset  . Stroke Mother   . Hyperlipidemia Mother   . Diabetes Mother   . Diabetes Father   . Hypertension Father   . Hyperlipidemia Father   .  Mental illness Father   . Stroke Father   . Diabetes Maternal Grandmother   . Heart disease Maternal Grandmother   . Hypertension Maternal Grandmother   . Heart attack Maternal Grandmother   . Colon cancer Other        multiple great aunts, great uncles  . Esophageal cancer Neg Hx   . Stomach cancer Neg Hx     Social History   Tobacco Use  . Smoking status: Former Smoker    Types: Cigarettes  . Smokeless tobacco: Never Used  Substance Use Topics  . Alcohol use: Not Currently    Alcohol/week: 2.0 standard drinks    Types: 2 Cans of beer per week  . Drug use: Yes    Types: Marijuana    Comment: Marijuana edibles    Home Medications Prior to Admission medications   Medication Sig Start Date End Date Taking? Authorizing Provider  cetirizine (ZYRTEC) 10 MG tablet Take 1 tablet (10 mg total) by mouth daily. Patient taking differently: Take 10 mg by mouth at bedtime.  06/26/19   Azzie Glatter, FNP  ferrous sulfate (FERROUSUL) 325 (65 FE) MG tablet Take 1 tablet (325 mg total) by mouth every other day. 08/12/19   Levin Erp, PA  hydrOXYzine (ATARAX/VISTARIL) 50 MG tablet Take 50-150 mg by mouth 3 (three) times daily as needed for anxiety or itching.     [provider]  lactulose (CHRONULAC) 10 GM/15ML solution Take 30 mLs (20 g total) by mouth 3 (three) times daily. 10/29/19 11/28/19  Thornton Park, MD  Lactulose 20 GM/30ML SOLN Take 30 mLs (20 g total) by mouth in the morning, at noon, and at bedtime. 11/26/19   Thornton Park, MD  midodrine (PROAMATINE) 10 MG tablet Take 10 mg by mouth 3 (three) times daily.    [provider]  pantoprazole (PROTONIX) 40 MG tablet Take 1 tablet (40 mg total) by mouth 2 (two) times daily before a meal. Patient taking differently: Take 40 mg by mouth 2 (two) times daily.  08/25/19   Levin Erp, PA  promethazine (PHENERGAN) 25 MG tablet Take 1 tablet (25 mg total) by mouth every 8 (eight) hours as needed for  up to 7 days for nausea or vomiting. 10/09/19 11/17/19  Azzie Glatter, FNP  Vitamin D, Ergocalciferol, (DRISDOL) 1.25 MG (50000 UNIT) CAPS capsule Take 1 capsule (50,000 Units total) by mouth every 7 (seven) days. 09/15/19   Azzie Glatter, FNP    Allergies    Epinephrine and Hydrocodone  Review of Systems   Review of Systems  Constitutional: Negative.  Negative for activity change, appetite change, chills, diaphoresis, fatigue and fever.  HENT: Negative.  Negative for ear pain and sore throat.  Eyes: Negative.  Negative for pain and visual disturbance.  Respiratory: Positive for shortness of breath. Negative for cough, chest tightness, wheezing and stridor.   Cardiovascular: Positive for leg swelling. Negative for chest pain and palpitations.  Gastrointestinal: Positive for abdominal distention, abdominal pain, constipation (chronic) and nausea. Negative for blood in stool, diarrhea and vomiting.  Genitourinary: Negative for dysuria, hematuria and pelvic pain.  Musculoskeletal: Negative for arthralgias, back pain, joint swelling and neck pain.  Skin: Negative for color change and rash.  Neurological: Negative for seizures, syncope, weakness, light-headedness, numbness and headaches.  Hematological: Bruises/bleeds easily (cirrhosis).  All other systems reviewed and are negative.   Physical Exam Updated Vital Signs BP 114/80 (BP Location: Right Arm)   Pulse 87   Temp 98.2 F (36.8 C) (Oral)   Resp 17   SpO2 100%   Physical Exam Vitals and nursing note reviewed.  Constitutional:      General: She is not in acute distress.    Appearance: She is not toxic-appearing or diaphoretic.  HENT:     Head: Normocephalic and atraumatic.     Mouth/Throat:     Mouth: Mucous membranes are moist.     Pharynx: Oropharynx is clear.  Eyes:     Conjunctiva/sclera: Conjunctivae normal.  Cardiovascular:     Rate and Rhythm: Normal rate and regular rhythm.     Heart sounds: No murmur.    Pulmonary:     Effort: Pulmonary effort is normal. No respiratory distress.     Breath sounds: Normal breath sounds. No wheezing.  Chest:     Chest wall: No tenderness.  Abdominal:     General: There is distension.     Palpations: Abdomen is soft. There is fluid wave.     Tenderness: There is generalized abdominal tenderness. There is no right CVA tenderness, left CVA tenderness, guarding or rebound.  Musculoskeletal:     Cervical back: Neck supple.  Skin:    General: Skin is warm and dry.     Capillary Refill: Capillary refill takes less than 2 seconds.  Neurological:     General: No focal deficit present.     Mental Status: She is alert and oriented to person, place, and time.     ED Results / Procedures / Treatments   Labs (all labs ordered are listed, but only abnormal results are displayed) Labs Reviewed  COMPREHENSIVE METABOLIC PANEL - Abnormal; Notable for the following components:      Result Value   Sodium 132 (*)    Creatinine, Ser 1.84 (*)    Calcium 8.7 (*)    Albumin 3.1 (*)    AST 56 (*)    Total Bilirubin 1.3 (*)    GFR calc non Af Amer 32 (*)    GFR calc Af Amer 37 (*)    All other components within normal limits  CBC - Abnormal; Notable for the following components:   RBC 3.49 (*)    Hemoglobin 10.6 (*)    HCT 33.5 (*)    Platelets 127 (*)    All other components within normal limits  LIPASE, BLOOD  URINALYSIS, ROUTINE W REFLEX MICROSCOPIC  I-STAT BETA HCG BLOOD, ED ([facility], [facility], [facility] ONLY)    EKG None  Radiology No results found.  Procedures .Paracentesis  Date/Time: 11/26/2019 11:00 PM Performed by: Godfrey Pick, MD Authorized by: Deno Etienne, DO   Consent:    Consent obtained:  Verbal   Consent given by:  Patient   Risks discussed:  Bleeding and infection   Alternatives discussed:  No treatment Pre-procedure details:    Procedure purpose:  Diagnostic   Preparation: Patient was prepped and draped in usual sterile fashion   Anesthesia (see  MAR for exact dosages):    Anesthesia method:  Local infiltration   Local anesthetic:  Lidocaine 1% w/o epi Procedure details:    Needle gauge:  18   Ultrasound guidance: yes     Puncture site:  L lower quadrant   Fluid removed amount:  5   Fluid appearance:  Amber   Dressing:  4x4 sterile gauze and adhesive bandage Post-procedure details:    Patient tolerance of procedure:  Tolerated well, no immediate complications   (including critical care time)  Medications Ordered in ED Medications  sodium chloride flush (NS) 0.9 % injection 3 mL (has no administration in time range)    ED Course  I have reviewed the triage vital signs and the nursing notes.  Pertinent labs & imaging results that were available during my care of the patient were reviewed by me and considered in my medical decision making (see chart for details).    MDM Rules/Calculators/A&P                      Patient is a 49 year old female who presents for worsening abdominal swelling and pain.  Symptoms are generalized.  Onset of symptoms was following paracentesis procedure 2 days ago.  Initially, pain was on the right side.  She also reports that she noted swelling to the right side of her abdomen.  Upon arrival in the ED, patient has diffuse swelling throughout her abdomen.  She has tenderness without guarding diffusely.  Vital signs are normal.  Initial labs show baseline hyponatremia, no leukocytosis, baseline hemoglobin.  Creatinine is 1.84 (elevated from baseline of 1.4-1.6).  Labs are otherwise reassuring.  Gastroenterology was called.  Patient is known to their service.  Given her symptoms of abdominal pain and tenderness, following procedure 2 days ago, possibility exists of secondary bacterial peritonitis.  Gastroenterology agrees with plan for CT of abdomen and pelvis and diagnostic paracentesis.  Patient initially refused IV access.  She stated that she was simply hungry.  Food was provided.  Patient  subsequently consented for peripheral IV in order to obtain CT scans with contrast.  CT scan showed chronic findings of liver cirrhosis and portal hypertension with large volume abdominal ascites.  There is moderate fluid in flank soft tissues. There is mild wall thickening of ascending a proximal transverse colon.  Findings are suggestive of anasarca.  5 cc of peritoneal fluid was obtained for diagnostic testing. No organisms were seen on gram stain. Remaining fluid analysis results were pending at time of signout. Patient is comfortable with discharge if results are normal. Care of patient was signed out to oncoming team.  Final Clinical Impression(s) / ED Diagnoses Final diagnoses:  None    Rx / DC Orders ED Discharge Orders    None       Godfrey Pick, MD 11/27/19 0201    Deno Etienne, DO 11/27/19 1503

## 2019-11-26 NOTE — Telephone Encounter (Signed)
Noted. Keep outpatient follow-up appointment next week as previously planned.

## 2019-11-26 NOTE — ED Notes (Signed)
Pt refusing IV start at this time.  Reviewed with pt the need to do CT scan.  States she has waited too long and would like to leave.  Upset as well that it was suggested she stay NPO.  MD notified.

## 2019-11-26 NOTE — Progress Notes (Signed)
Patient presented today for US abdomen due to reported of diffuse abdominal pain and swelling s/p paracentesis 5/3 in IR.  Patient reports RUQ and RLQ abdominal pain immediately post procedure however this has essentially resolved. She reports weakness, nausea without vomiting and significant abdominal swelling - particularly about her lower mid abdomen/pelvis area. She denies any fevers or chills. She is very concerned as the swelling occurred essentially overnight and has never been this severe, she has never had outpouching of her lower abdomen. Limited abdominal US shows no obvious hematoma post procedure, however there is a some SQ edema which is diffuse throughout the abdomen and not only directly over the puncture site. There is no bruising or bleeding.  Discussed with patient that the rapid re-accumulation of ascites with new onset anasarca and complaints of weakness, nausea, etc require a further workup as this is not typical post paracentesis. Patient was advised to present to the ED today for further evaluation by myself and also apparently the patient's GI doctor (per her report) - after thorough discussion patient agreed to be seen in the ED and was taken to triage by [place] tech.  IR will continue to follow chart for now - please call with any questions or concerns.  Candiss Norse, PA-C Pager# [phone]

## 2019-11-26 NOTE — Telephone Encounter (Signed)
Pt in er, had para yesterday and already has about 3 liters back on her abd. Waiting in the er to be evaluated. Wanted to let Dr. Tarri Glenn know. Pt also requested smaller volume script of lactulose so she can just pick up a bottle a week. Instead of 6 bottles at a time. New script sent to pharmacy.

## 2019-11-26 NOTE — ED Notes (Signed)
Pt returned from CT °

## 2019-11-26 NOTE — ED Notes (Signed)
Pt given lunch by MD and does agree to stay and allow IV for CT scan.  Up to the BR (I) without pain responses.

## 2019-11-27 ENCOUNTER — Telehealth: Payer: Self-pay | Admitting: Gastroenterology

## 2019-11-27 LAB — BODY FLUID CELL COUNT WITH DIFFERENTIAL
Eos, Fluid: 0 %
Lymphs, Fluid: 52 %
Monocyte-Macrophage-Serous Fluid: 40 % — ABNORMAL LOW (ref 50–90)
Neutrophil Count, Fluid: 8 % (ref 0–25)

## 2019-11-27 LAB — ALBUMIN, PLEURAL OR PERITONEAL FLUID: Albumin, Fluid: <1 g/dL

## 2019-11-27 LAB — GLUCOSE, PLEURAL OR PERITONEAL FLUID: Glucose, Fluid: 109 mg/dL

## 2019-11-27 LAB — PROTEIN, PLEURAL OR PERITONEAL FLUID: Total protein, fluid: <3 g/dL

## 2019-11-27 LAB — LACTATE DEHYDROGENASE, PLEURAL OR PERITONEAL FLUID: LD, Fluid: 38 U/L — ABNORMAL HIGH (ref 3–23)

## 2019-11-27 NOTE — Telephone Encounter (Signed)
I reviewed her records from the ED. The ascites is the the tracts where she has had recurrent paracenteses.  I do not have any recommendations at this time beyond trying to avoid paracentesis with eating NO salt and abstaining from alcohol to allow the body the liver and affected blood vessels the opportunity to try to limit the formation of additional ascites.  Thanks.

## 2019-11-27 NOTE — ED Provider Notes (Signed)
I assumed care of this patient.  Please see previous provider note for further details of Hx, PE.  Briefly patient is a 49 y.o. female who presented abd pain and distension following therapeutic paracentesis several days ago. CT reassuring. Pending paracentesis labs.  Peritoneal fluid not consistent with SBP.  The patient appears reasonably screened and/or stabilized for discharge and I doubt any other medical condition or other [facility] requiring further screening, evaluation, or treatment in the ED at this time prior to discharge. Safe for discharge with strict return precautions.  Disposition: Discharge  Condition: Good  I have discussed the results, Dx and Tx plan with the patient/family who expressed understanding and agree(s) with the plan. Discharge instructions discussed at length. The patient/family was given strict return precautions who verbalized understanding of the instructions. No further questions at time of discharge.    ED Discharge Orders    None       Follow Up: [facility] [address] [id number] [place] Go to  If symptoms worsen          Fatima Blank, MD 11/27/19 [phone]

## 2019-11-27 NOTE — Telephone Encounter (Signed)
Left detailed message for pt regarding Dr. Tarri Glenn recommendations.

## 2019-11-27 NOTE — Telephone Encounter (Signed)
Pt was in the ER yesterday. She had a CT and an US done. Pt states she was told she has ascites fluid that has leaked into the tissue in her pelvic area. States this is quite uncomfortable when she sits or stands. OK when she is lying down. Pt wanted to know if Dr. Tarri Glenn has any recommendations. Please advise.

## 2019-11-27 NOTE — Discharge Instructions (Signed)
Follow-up, as scheduled , next week with gastroenterology clinic. Return to the ED if you develop fevers, chills, or worsening severity of your abdominal pain.

## 2019-11-30 LAB — BODY FLUID CULTURE: Culture: NO GROWTH

## 2019-12-01 ENCOUNTER — Telehealth: Payer: Self-pay | Admitting: Family Medicine

## 2019-12-01 NOTE — Progress Notes (Signed)
12/01/2019 Kara Torres [id number] 1970-07-26   Chief Complaint: RLQ abdominal pain since having paracentesis  History of Present Illness: Kara Torres A. Kara Torres is a 49 year old female with a past medical history of anxiety, depression, asthma,  alpha 1 antitrypsin deficiency, hypertension, fatty liver disease, alcoholic cirrhosis with acites and esophageal varices. S/P cholecystectomy 2000.   She was last seen in our office by Dr. Tarri Glenn on 10/29/2019.  At that time, she remained uninsured therefore a referral to [facility] for liver transplant evaluation was on hold.  She  is now enrolled in Medicaid therefore I will reinitiate a referral to [facility] at this time.  Today's MELD score is 17. 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  She is well aware she must remain sober to be considered for a liver transplant.  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 She was referred to [facility] for further nephrology evaluation. She was seen in their office by Dr. Royce Macadamia approximately 2 weeks ago. She is on Midodrine 71m po tid.  I will request a copy of her nephrology consult. Creatinine level has decreased from 1.84 to 1.78.  Na+ is 130 down from 133.   She has refractory ascites. She reports adhering to a 2 g sodium restriction.  She continues to undergo intermittent therapeutic paracentesis.  Dr. BTarri Glennpreviously recommended avoiding frequent paracentesis to minimize further protein loss, fluid shifts in order to preserve renal function.  Paracentesis recommended when she develops shortness of breath or extreme tense ascites.  Paracentesis 08/20/2019: 5 L removed Paracentesis 09/09/2019: 9.1 L removed Paracentesis 09/26/19: 5L removed Paracentesis 10/12/19: 5L removed Paracentesis 10/22/19: 5L removed Paracentesis 11/05/2019: 4.8L removed Paracentesis 11/24/2019:  3.5L removed   Her most recent therapeutic  paracentesis was on 11/24/2019, 3.5 L of peritoneal fluid was removed.  She reported having a significant amount of bright lower quadrant pain around the site of the paracentesis catheter immediately after the paracentesis was completed.  She stated when the catheter was pulled out, the technician struggled with removing the catheter.  In the past, the catheter was removed smoothly.  She went home later in the day, she used a heating pad due to ongoing right lower quadrant soreness.  She contacted the hospital interventional radiology with her complaints of pain post paracentesis.  She underwent a transabdominal pelvic ultrasound on 11/26/2019 which showed recurrent large volume ascites with edema versus a hematoma in the preperitoneal space of the abdominal wall at the recent paracentesis site. She then proceeded to the ED for further evaluation.  Abdominal/pelvic CT scan showed liver cirrhosis with portal hypertension, moderate fluid present within the right lateral abdominal and flank soft tissues with large volume of abdominal ascites.  Mild wall thickening of the ascending and proximal transverse colon were noted as well, most likely due to hypoproteinemia/portal enteropathy and less likely colitis.   Labs in the ED 11/26/2019: Sodium 132.  Potassium 4.7.  Glucose 89.  BUN 15.  Creatinine 1.84.  Calcium 8.7.  Alk phos 79.  Albumin 3.1.  Lipase 36.  AST 56.  ALT 31.  Total bili 1.3.  GFR 32.  WBC 4.3.  Hemoglobin 10.6.  Hematocrit 33.5.  MCV 96.0.  Platelet 127.  Currently, she continues to have right lower quadrant abdominal pain primarily at the site of her paracentesis on 5/4.  Her abdomen feels tight.  No nausea or vomiting.  She is passing 3-4 firm  brown bowel movements in the morning, she occasionally has 1 or 2 firm to softer stools later in the evening.  No watery diarrhea.  No rectal bleeding or melena.  She is taking lactulose 20 g 1-3 times daily.  No nausea or vomiting.  No pruritus.  She denies  having any recent confusion.  She continues to work a regular basis.  She is taking ferrous sulfate 325 mg every other day which she is tolerating fairly well.    EGD 04/28/2019: Grade 2 esophageal varices   Colonoscopy 04/2019: - Hemorrhoids found on perianal exam. - Non-bleeding external and internal hemorrhoids. - One 1 mm lymphoid aggregate polyp in the descending colon, removed piecemeal using a cold snare.  - The examined portion of the ileum was normal. - The examination was otherwise normal on direct and retroflexion views. - No obvious explanation for recent weight loss, abdominal pain, or nausea/vomiting identified.    Current Outpatient Medications on File Prior to Visit  Medication Sig Dispense Refill  . cetirizine (ZYRTEC) 10 MG tablet Take 1 tablet (10 mg total) by mouth daily. (Patient taking differently: Take 10 mg by mouth at bedtime. ) 30 tablet 11  . DULoxetine (CYMBALTA) 20 MG capsule Take 20 mg by mouth daily.    . ferrous sulfate (FERROUSUL) 325 (65 FE) MG tablet Take 1 tablet (325 mg total) by mouth every other day. 60 tablet 2  . hydrOXYzine (VISTARIL) 50 MG capsule Take 50-150 mg by mouth 4 (four) times daily as needed for itching or anxiety.     . Lactulose 20 GM/30ML SOLN Take 30 mLs (20 g total) by mouth in the morning, at noon, and at bedtime. 650 mL 6  . midodrine (PROAMATINE) 10 MG tablet Take 10 mg by mouth 3 (three) times daily.    . pantoprazole (PROTONIX) 40 MG tablet Take 1 tablet (40 mg total) by mouth 2 (two) times daily before a meal. (Patient taking differently: Take 40 mg by mouth 2 (two) times daily. ) 180 tablet 3  . Vitamin D, Ergocalciferol, (DRISDOL) 1.25 MG (50000 UNIT) CAPS capsule Take 1 capsule (50,000 Units total) by mouth every 7 (seven) days. (Patient taking differently: Take 50,000 Units by mouth every Monday. ) 5 capsule 6  . promethazine (PHENERGAN) 25 MG tablet Take 1 tablet (25 mg total) by mouth every 8 (eight) hours as needed for up to  7 days for nausea or vomiting. 20 tablet 0   No current facility-administered medications on file prior to visit.   Allergies  Allergen Reactions  . Epinephrine Other (See Comments)    Per pt, makes her feel "intoxicated"  . Hydrocodone Rash    Current Medications, Allergies, Past Medical History, Past Surgical History, Family History and Social History were reviewed in [employer] record.   Physical Exam: BP 90/70   Pulse 64   Temp 97.8 F (36.6 C)   Ht '5\' 4"'  (1.626 m)   Wt 133 lb 12.8 oz (60.7 kg)   BMI 22.97 kg/m   General: 49 year old female in NAD.  Head: Normocephalic and atraumatic. Eyes: No scleral icterus. Conjunctiva pink . Ears: Normal auditory acuity. Mouth:No ulcers or lesions.  Lungs: Clear throughout to auscultation. Heart: Regular rate and rhythm, no murmur. Abdomen: Distended with a moderate amount of ascites, abdomen is not tense.  Mild tenderness without erythema or drainage from the right lower quadrant paracentesis site.  Rectal: Deferred.  Musculoskeletal: Symmetrical with no gross deformities. Extremities: No edema.  Neurological: Alert oriented x 4. No focal deficits.  No asterixis. Psychological: Alert and cooperative. Normal mood and affect  Assessment and Recommendations:  62. 49 year old female with EtOH decompensated cirrhosis with serial LVPs without evidence of SBP. MELD Score 17.  - Re-request liver transplant evaluation at [facility] -Liver doppler to rule out hepatic and portal vein thrombosis  - Continue 2g sodium restricted diet, 2 L fluid restriction - Lactulose 20g TID - titrated to have at least 3 bowel movements - No diuretics at this time - LVP PRN - with goal of no more frequently than every 2 week, with the interval as extended as long as possible - Twinrix vaccination to be scheduled as a nurse appointment, patient declined vaccination today as she did not wish to receive a               vaccination on a work day.  - She declined Covid 10 Vaccination  - No alcohol ever reinforced  -Consider consult with nutritionist -CBC, CMP, PT/INR -Follow up in office with Dr. Tarri Glenn in 4 weeks   2. IDA, stable. No signs of active GI bleeding.  - Continue Ferrous Sulfate 316m QID  3. Renal insufficiency, at risk for HRS with elevated creatinine level, hyponatremia and urine NA+ < 10 - Continue Nephrology follow up - Nephrology consult with Dr. FRoyce Macadamiarequested  4. Hepatic encephalopathy. No current sings of HE.  Xifaxan was not affordable. -Continue lactulose  5. Thrombocytopenia secondary to cirrhosis   6. Coagulopathy secondary to cirrhosis

## 2019-12-01 NOTE — Telephone Encounter (Signed)
Pt wants doctor to look at CT scan (in her chart) and wants to see if she can get a prescription to help with her pain.

## 2019-12-02 ENCOUNTER — Other Ambulatory Visit: Payer: Self-pay

## 2019-12-02 ENCOUNTER — Telehealth: Payer: Self-pay | Admitting: Nurse Practitioner

## 2019-12-02 ENCOUNTER — Ambulatory Visit: Payer: Medicaid Other | Admitting: Nurse Practitioner

## 2019-12-02 ENCOUNTER — Other Ambulatory Visit (INDEPENDENT_AMBULATORY_CARE_PROVIDER_SITE_OTHER): Payer: Medicaid Other

## 2019-12-02 ENCOUNTER — Encounter: Payer: Self-pay | Admitting: Nurse Practitioner

## 2019-12-02 VITALS — BP 90/70 | HR 64 | Temp 97.8°F | Ht 64.0 in | Wt 133.8 lb

## 2019-12-02 DIAGNOSIS — K7460 Unspecified cirrhosis of liver: Secondary | ICD-10-CM

## 2019-12-02 DIAGNOSIS — K7031 Alcoholic cirrhosis of liver with ascites: Secondary | ICD-10-CM | POA: Diagnosis not present

## 2019-12-02 DIAGNOSIS — K7290 Hepatic failure, unspecified without coma: Secondary | ICD-10-CM | POA: Diagnosis not present

## 2019-12-02 LAB — COMPREHENSIVE METABOLIC PANEL
ALT: 29 U/L (ref 0–35)
AST: 48 U/L — ABNORMAL HIGH (ref 0–37)
Albumin: 3.4 g/dL — ABNORMAL LOW (ref 3.5–5.2)
Alkaline Phosphatase: 88 U/L (ref 39–117)
BUN: 20 mg/dL (ref 6–23)
CO2: 24 mEq/L (ref 19–32)
Calcium: 9.1 mg/dL (ref 8.4–10.5)
Chloride: 97 mEq/L (ref 96–112)
Creatinine, Ser: 1.78 mg/dL — ABNORMAL HIGH (ref 0.40–1.20)
GFR: 30.61 mL/min — ABNORMAL LOW (ref >60.00)
Glucose, Bld: 114 mg/dL — ABNORMAL HIGH (ref 70–99)
Potassium: 3.9 mEq/L (ref 3.5–5.1)
Sodium: 130 mEq/L — ABNORMAL LOW (ref 135–145)
Total Bilirubin: 1.7 mg/dL — ABNORMAL HIGH (ref 0.2–1.2)
Total Protein: 7 g/dL (ref 6.0–8.3)

## 2019-12-02 LAB — PROTIME-INR
INR: 1.3 ratio — ABNORMAL HIGH (ref 0.8–1.0)
Prothrombin Time: 14 s — ABNORMAL HIGH (ref 9.6–13.1)

## 2019-12-02 LAB — CBC WITH DIFFERENTIAL/PLATELET
Basophils Absolute: 0 10*3/uL (ref 0.0–0.1)
Basophils Relative: 0.9 % (ref 0.0–3.0)
Eosinophils Absolute: 0.1 10*3/uL (ref 0.0–0.7)
Eosinophils Relative: 2.4 % (ref 0.0–5.0)
HCT: 34.5 % — ABNORMAL LOW (ref 36.0–46.0)
Hemoglobin: 11.5 g/dL — ABNORMAL LOW (ref 12.0–15.0)
Lymphocytes Relative: 22.9 % (ref 12.0–46.0)
Lymphs Abs: 1.1 10*3/uL (ref 0.7–4.0)
MCHC: 33.4 g/dL (ref 30.0–36.0)
MCV: 93.2 fl (ref 78.0–100.0)
Monocytes Absolute: 0.4 10*3/uL (ref 0.1–1.0)
Monocytes Relative: 8.4 % (ref 3.0–12.0)
Neutro Abs: 3.2 10*3/uL (ref 1.4–7.7)
Neutrophils Relative %: 65.4 % (ref 43.0–77.0)
Platelets: 128 10*3/uL — ABNORMAL LOW (ref 150.0–400.0)
RBC: 3.7 Mil/uL — ABNORMAL LOW (ref 3.87–5.11)
RDW: 15.5 % (ref 11.5–15.5)
WBC: 4.8 10*3/uL (ref 4.0–10.5)

## 2019-12-02 NOTE — Telephone Encounter (Signed)
Kara Torres, can you contact [facility] to re-request liver transplant consult as the patient currently has Medicaid, previously denied by [facility] when she was uninsured.   I have requested a copy of her nephrology consult from [facility], I will let you know when received as [facility] should have that info.   Refer to the letter from [place] which is found under Media dated 10/16/2019.   Patient has remained sober since then end of 06/2019 therefore they will hopefully be willing to see her end of June (which will be 6 months of being sober).   Please keep Dr. Tarri Glenn updated on referral process.  Also, she needs Hep A and Hep B vaccination. She did not wish to receive today as she was going back to work. Please call her in the next few days to schedule a nurse appointment for Twinrix.   Thank you.

## 2019-12-02 NOTE — Patient Instructions (Addendum)
If you are age 47 or older, your body mass index should be between 23-30. Your Body mass index is 22.97 kg/m. If this is out of the aforementioned range listed, please consider follow up with your Primary Care Provider.  If you are age 55 or younger, your body mass index should be between 19-25. Your Body mass index is 22.97 kg/m. If this is out of the aformentioned range listed, please consider follow up with your Primary Care Provider.  ____________________________________________________________   Your provider has requested that you go to the basement level for lab work before leaving today. Press "B" on the elevator. The lab is located at the first door on the left as you exit the elevator.  You are scheduled for a liver doppler on 12/10/2019 at 7:45 at [facility], please have nothing to eat or drink after midnight.  Continue with your lactulose three times a day  2 gram low sodium diet.  Call office if you gain 2 lbs within 24 hours. Continue to follow your nephrologist  Due to recent changes in healthcare laws, you may see the results of your imaging and laboratory studies on MyChart before your provider has had a chance to review them.  We understand that in some cases there may be results that are confusing or concerning to you. Not all laboratory results come back in the same time frame and the provider may be waiting for multiple results in order to interpret others.  Please give [place] 48 hours in order for your provider to thoroughly review all the results before contacting the office for clarification of your results.

## 2019-12-02 NOTE — Progress Notes (Signed)
Discussed with Carl Best.   Navya Timmons L. Tarri Glenn, MD, MPH

## 2019-12-03 ENCOUNTER — Telehealth: Payer: Self-pay

## 2019-12-03 NOTE — Telephone Encounter (Signed)
Recent office note and labs faxed to [facility]. Called patient and left her a message of this referral. Also left the message asking her to call and ask for me when she is ready to schedule the Twinrix vaccine (Hep A and B). Order is placed in [place].Adived her in the message she can have this done on any M-W-F when convenient for her.

## 2019-12-03 NOTE — Telephone Encounter (Signed)
Kara Torres has requested a referral to pain management.

## 2019-12-07 ENCOUNTER — Telehealth: Payer: Self-pay | Admitting: Nurse Practitioner

## 2019-12-07 NOTE — Telephone Encounter (Signed)
Her last paracentesis was 11/24/19 and her renal function has declined on her lab work from last week. My recommendations would be to hold off until she is as uncomfortable as she can tolerate. When she cannot take it any more, she may have a paracentesis. She is at higher risk for complications the more paracenteses she has and the more frequently she has them. Thank you.

## 2019-12-07 NOTE — Telephone Encounter (Signed)
Patient reports her abdomen is very tense, some increase shortness of breath and a feeling of heaviness. She denies missing medications. Adhering to her prescribed low sodium diet.  She requests paracentesis with "more than 3 liters removed" and "same staffing." Referral to [facility] has been acknowledged.  Please advise on paracentesis.

## 2019-12-07 NOTE — Telephone Encounter (Signed)
Patient is advised of the recommendations.

## 2019-12-08 ENCOUNTER — Other Ambulatory Visit: Payer: Self-pay | Admitting: General Surgery

## 2019-12-08 MED ORDER — PANTOPRAZOLE SODIUM 40 MG PO TBEC: 40.0000 mg | DELAYED_RELEASE_TABLET | Freq: Two times a day (BID) | ORAL | 3 refills | Status: AC

## 2019-12-09 ENCOUNTER — Other Ambulatory Visit: Payer: Self-pay | Admitting: Nephrology

## 2019-12-09 ENCOUNTER — Other Ambulatory Visit: Payer: Self-pay

## 2019-12-09 ENCOUNTER — Telehealth: Payer: Self-pay

## 2019-12-09 DIAGNOSIS — K7031 Alcoholic cirrhosis of liver with ascites: Secondary | ICD-10-CM

## 2019-12-09 NOTE — Telephone Encounter (Signed)
Dr. Tarri Glenn for [facility] and the IR para do you want no more than 10 liters removed, 100 gm albumin, cell ct and diff,gram stain, aerobic and anerobic culture?  Yes, please! Please include a total protein!  Pt scheduled for IR para as ordered above at [facility] 12/11/19@9am , pt to arrive there at 8:45am. Pt notified of appt via mychart.

## 2019-12-10 ENCOUNTER — Other Ambulatory Visit: Payer: Self-pay

## 2019-12-10 ENCOUNTER — Other Ambulatory Visit: Payer: Medicaid Other

## 2019-12-10 ENCOUNTER — Ambulatory Visit (HOSPITAL_COMMUNITY)
Admission: RE | Admit: 2019-12-10 | Discharge: 2019-12-10 | Disposition: A | Discharge status: Home / Self Care | Payer: Medicaid Other | Source: Ambulatory Visit | Attending: Nurse Practitioner | Admitting: Nurse Practitioner

## 2019-12-10 ENCOUNTER — Ambulatory Visit (HOSPITAL_COMMUNITY)
Admission: RE | Admit: 2019-12-10 | Discharge: 2019-12-10 | Disposition: A | Discharge status: Home / Self Care | Payer: Medicaid Other | Source: Ambulatory Visit | Attending: Gastroenterology | Admitting: Gastroenterology

## 2019-12-10 DIAGNOSIS — K7031 Alcoholic cirrhosis of liver with ascites: Secondary | ICD-10-CM | POA: Insufficient documentation

## 2019-12-10 DIAGNOSIS — K7460 Unspecified cirrhosis of liver: Secondary | ICD-10-CM | POA: Insufficient documentation

## 2019-12-10 DIAGNOSIS — K7290 Hepatic failure, unspecified without coma: Secondary | ICD-10-CM | POA: Diagnosis not present

## 2019-12-10 HISTORY — PX: IR PARACENTESIS: IMG2679

## 2019-12-10 LAB — BODY FLUID CELL COUNT WITH DIFFERENTIAL
Eos, Fluid: 0 %
Lymphs, Fluid: 52 %
Monocyte-Macrophage-Serous Fluid: 43 % — ABNORMAL LOW (ref 50–90)
Neutrophil Count, Fluid: 5 % (ref 0–25)
Total Nucleated Cell Count, Fluid: 78 cu mm (ref 0–1000)

## 2019-12-10 LAB — GRAM STAIN

## 2019-12-10 LAB — PROTEIN, PLEURAL OR PERITONEAL FLUID: Total protein, fluid: <3 g/dL

## 2019-12-10 MED ORDER — LIDOCAINE HCL (PF) 1 % IJ SOLN
INTRAMUSCULAR | Status: DC | PRN
  Administered 2019-12-10: 10 mL

## 2019-12-10 MED ORDER — ALBUMIN HUMAN 25 % IV SOLN
100.0000 g | Freq: Once | INTRAVENOUS | Status: AC
  Administered 2019-12-10: 100 g via INTRAVENOUS
  Filled 2019-12-10: qty 400

## 2019-12-10 MED ORDER — ALBUMIN HUMAN 25 % IV SOLN
INTRAVENOUS | Status: AC
  Filled 2019-12-10: qty 400

## 2019-12-10 MED ORDER — LIDOCAINE HCL 1 % IJ SOLN
INTRAMUSCULAR | Status: AC
  Filled 2019-12-10: qty 20

## 2019-12-10 NOTE — Procedures (Signed)
PROCEDURE SUMMARY:  Successful US guided paracentesis from left lateral abdomen.  Yielded 5.8 liters of clear yellow fluid.  No immediate complications.  Patient tolerated well.  EBL = trace  Specimen was sent for labs.  Kimberlin Scheel S Atari Novick PA-C 12/10/2019 10:55 AM

## 2019-12-11 ENCOUNTER — Ambulatory Visit (HOSPITAL_COMMUNITY): Payer: Medicaid Other

## 2019-12-11 NOTE — Progress Notes (Signed)
Albumin 50gm given, not 100gms

## 2019-12-14 ENCOUNTER — Ambulatory Visit: Payer: Medicaid Other | Admitting: Family Medicine

## 2019-12-14 LAB — PATHOLOGIST SMEAR REVIEW

## 2019-12-15 LAB — CULTURE, BODY FLUID W GRAM STAIN -BOTTLE: Culture: NO GROWTH

## 2019-12-29 ENCOUNTER — Other Ambulatory Visit: Payer: Self-pay

## 2019-12-29 ENCOUNTER — Other Ambulatory Visit (INDEPENDENT_AMBULATORY_CARE_PROVIDER_SITE_OTHER): Payer: Medicaid Other

## 2019-12-29 DIAGNOSIS — K7031 Alcoholic cirrhosis of liver with ascites: Secondary | ICD-10-CM

## 2019-12-29 LAB — BASIC METABOLIC PANEL
BUN: 20 mg/dL (ref 6–23)
CO2: 26 mEq/L (ref 19–32)
Calcium: 9 mg/dL (ref 8.4–10.5)
Chloride: 92 mEq/L — ABNORMAL LOW (ref 96–112)
Creatinine, Ser: 2.05 mg/dL — ABNORMAL HIGH (ref 0.40–1.20)
GFR: 26 mL/min — ABNORMAL LOW (ref >60.00)
Glucose, Bld: 98 mg/dL (ref 70–99)
Potassium: 4.5 mEq/L (ref 3.5–5.1)
Sodium: 123 mEq/L — ABNORMAL LOW (ref 135–145)

## 2019-12-30 ENCOUNTER — Other Ambulatory Visit: Payer: Self-pay

## 2019-12-30 DIAGNOSIS — K7031 Alcoholic cirrhosis of liver with ascites: Secondary | ICD-10-CM

## 2019-12-31 ENCOUNTER — Other Ambulatory Visit: Payer: Self-pay

## 2019-12-31 ENCOUNTER — Ambulatory Visit (HOSPITAL_COMMUNITY)
Admission: RE | Admit: 2019-12-31 | Discharge: 2019-12-31 | Disposition: A | Discharge status: Home / Self Care | Payer: Medicaid Other | Source: Ambulatory Visit | Attending: Gastroenterology | Admitting: Gastroenterology

## 2019-12-31 DIAGNOSIS — K7031 Alcoholic cirrhosis of liver with ascites: Secondary | ICD-10-CM | POA: Diagnosis not present

## 2019-12-31 HISTORY — PX: IR PARACENTESIS: IMG2679

## 2019-12-31 LAB — GRAM STAIN

## 2019-12-31 LAB — BODY FLUID CELL COUNT WITH DIFFERENTIAL
Lymphs, Fluid: 39 %
Monocyte-Macrophage-Serous Fluid: 60 % (ref 50–90)
Neutrophil Count, Fluid: 1 % (ref 0–25)
Total Nucleated Cell Count, Fluid: 109 cu mm (ref 0–1000)

## 2019-12-31 LAB — PROTEIN, PLEURAL OR PERITONEAL FLUID: Total protein, fluid: <3 g/dL

## 2019-12-31 MED ORDER — ALBUMIN HUMAN 25 % IV SOLN
INTRAVENOUS | Status: AC
  Filled 2019-12-31: qty 100

## 2019-12-31 MED ORDER — LIDOCAINE HCL 1 % IJ SOLN
INTRAMUSCULAR | Status: AC | PRN
  Administered 2019-12-31: 10 mL

## 2019-12-31 MED ORDER — LIDOCAINE HCL 1 % IJ SOLN
INTRAMUSCULAR | Status: AC
  Filled 2019-12-31: qty 20

## 2019-12-31 NOTE — Procedures (Signed)
PROCEDURE SUMMARY:  Successful image-guided paracentesis from the left lower abdomen.  Yielded 7.8 liters of clear yellow fluid.  No immediate complications.  EBL: zero Patient tolerated well.   Specimen was sent for labs.  Please see imaging section of Epic for full dictation.  Joaquim Nam PA-C 12/31/2019 1:40 PM

## 2020-01-04 ENCOUNTER — Other Ambulatory Visit: Payer: Medicaid Other

## 2020-01-04 LAB — PATHOLOGIST SMEAR REVIEW

## 2020-01-05 ENCOUNTER — Ambulatory Visit
Admission: RE | Admit: 2020-01-05 | Discharge: 2020-01-05 | Disposition: A | Discharge status: Home / Self Care | Payer: Medicaid Other | Source: Ambulatory Visit | Attending: Nephrology | Admitting: Nephrology

## 2020-01-05 DIAGNOSIS — N179 Acute kidney failure, unspecified: Secondary | ICD-10-CM

## 2020-01-05 LAB — CULTURE, BODY FLUID W GRAM STAIN -BOTTLE: Culture: NO GROWTH

## 2020-01-06 ENCOUNTER — Telehealth: Payer: Self-pay | Admitting: Gastroenterology

## 2020-01-06 ENCOUNTER — Other Ambulatory Visit: Payer: Medicaid Other

## 2020-01-06 NOTE — Telephone Encounter (Signed)
Sending as an Pharmacist, hospital. Discussed with Dr. Royce Macadamia with Nephrology. She asks that we not take off more than 3-4 liters at a time with any paracentesis given her declining renal function. Unfortunately, I don't think this will provide much clinical benefit and will recur quickly. But, we should be as careful as possible moving forward. Hopefully she will have her transplant evaluation in the near future.

## 2020-01-06 NOTE — Telephone Encounter (Signed)
Dr. Luis Abed office (Neuro) called stating Dr. Royce Macadamia would like to speak with you. Please call her on her cell number provided.

## 2020-01-07 NOTE — Telephone Encounter (Signed)
Noted  

## 2020-01-11 NOTE — Telephone Encounter (Signed)
Dr. Tarri Glenn,   Clinical summary from 01/07/20 visit at [facility] is in [place]. Thank you

## 2020-01-14 ENCOUNTER — Ambulatory Visit: Payer: Medicaid Other | Admitting: Gastroenterology

## 2020-01-14 ENCOUNTER — Telehealth: Payer: Self-pay | Admitting: Gastroenterology

## 2020-01-14 NOTE — Telephone Encounter (Signed)
Pt called to inform Dr. Tarri Glenn that she is still hospitalized and she has an appt today with her at 1:30pm. She stated that she was reassured that she was going to be d/c today so she can make it to her appt but pt does not think that it would happen.

## 2020-01-14 NOTE — Telephone Encounter (Signed)
Please reschedule with me or Jaclyn Shaggy - whoever has the first availability. Thanks.

## 2020-01-14 NOTE — Telephone Encounter (Signed)
Pt must have been confused about her [facility] appt. She is in the hospital at [facility]'s substance use intensive outpatient program (SU-IOP).  She thought she would be discharged today in time for her appt but states this is not going to happen.   I called [place] to see when her actual appt is and she is scheduled to see Dr. Mosetta Pigeon 02/08/20 at 1:30pm. I have cancelled her appt today with you.

## 2020-01-15 ENCOUNTER — Other Ambulatory Visit: Payer: Self-pay | Admitting: Family Medicine

## 2020-01-15 ENCOUNTER — Telehealth: Payer: Self-pay | Admitting: Family Medicine

## 2020-01-15 DIAGNOSIS — R110 Nausea: Secondary | ICD-10-CM

## 2020-01-15 MED ORDER — PROMETHAZINE HCL 25 MG PO TABS: 25.0000 mg | ORAL_TABLET | Freq: Three times a day (TID) | ORAL | 0 refills | Status: AC | PRN

## 2020-01-18 ENCOUNTER — Ambulatory Visit: Payer: Self-pay | Admitting: Family Medicine

## 2020-01-18 NOTE — Telephone Encounter (Signed)
Done

## 2020-01-18 NOTE — Telephone Encounter (Signed)
Pt scheduled to see Carl Best NP 02/12/20@1 :30pm Pt notified of appt via mychart.

## 2020-01-22 ENCOUNTER — Ambulatory Visit: Payer: Medicaid Other | Admitting: Family Medicine

## 2020-01-26 ENCOUNTER — Ambulatory Visit (HOSPITAL_COMMUNITY): Payer: Medicaid Other | Admitting: Licensed Clinical Social Worker

## 2020-01-26 ENCOUNTER — Other Ambulatory Visit: Payer: Self-pay

## 2020-02-02 ENCOUNTER — Other Ambulatory Visit: Payer: Self-pay

## 2020-02-02 ENCOUNTER — Telehealth (HOSPITAL_COMMUNITY): Payer: Medicaid Other | Admitting: Psychiatric/Mental Health

## 2020-02-12 ENCOUNTER — Ambulatory Visit: Payer: Medicaid Other | Admitting: Nurse Practitioner

## 2020-02-16 ENCOUNTER — Other Ambulatory Visit: Payer: Self-pay

## 2020-02-16 ENCOUNTER — Ambulatory Visit (HOSPITAL_COMMUNITY): Payer: Medicaid Other | Admitting: Licensed Clinical Social Worker

## 2020-02-16 ENCOUNTER — Ambulatory Visit: Payer: Medicaid Other | Admitting: Family Medicine

## 2021-01-28 IMAGING — US IR PARACENTESIS
1 series · 3 of 3 positions shown · non-contrast
Comparison: none

INDICATION: Patient with history of alcoholic cirrhosis, recurrent ascites.
Request to IR for therapeutic paracentesis.

[Series 1: (id) · 3 of 3 slices shown]
[im 1/3]
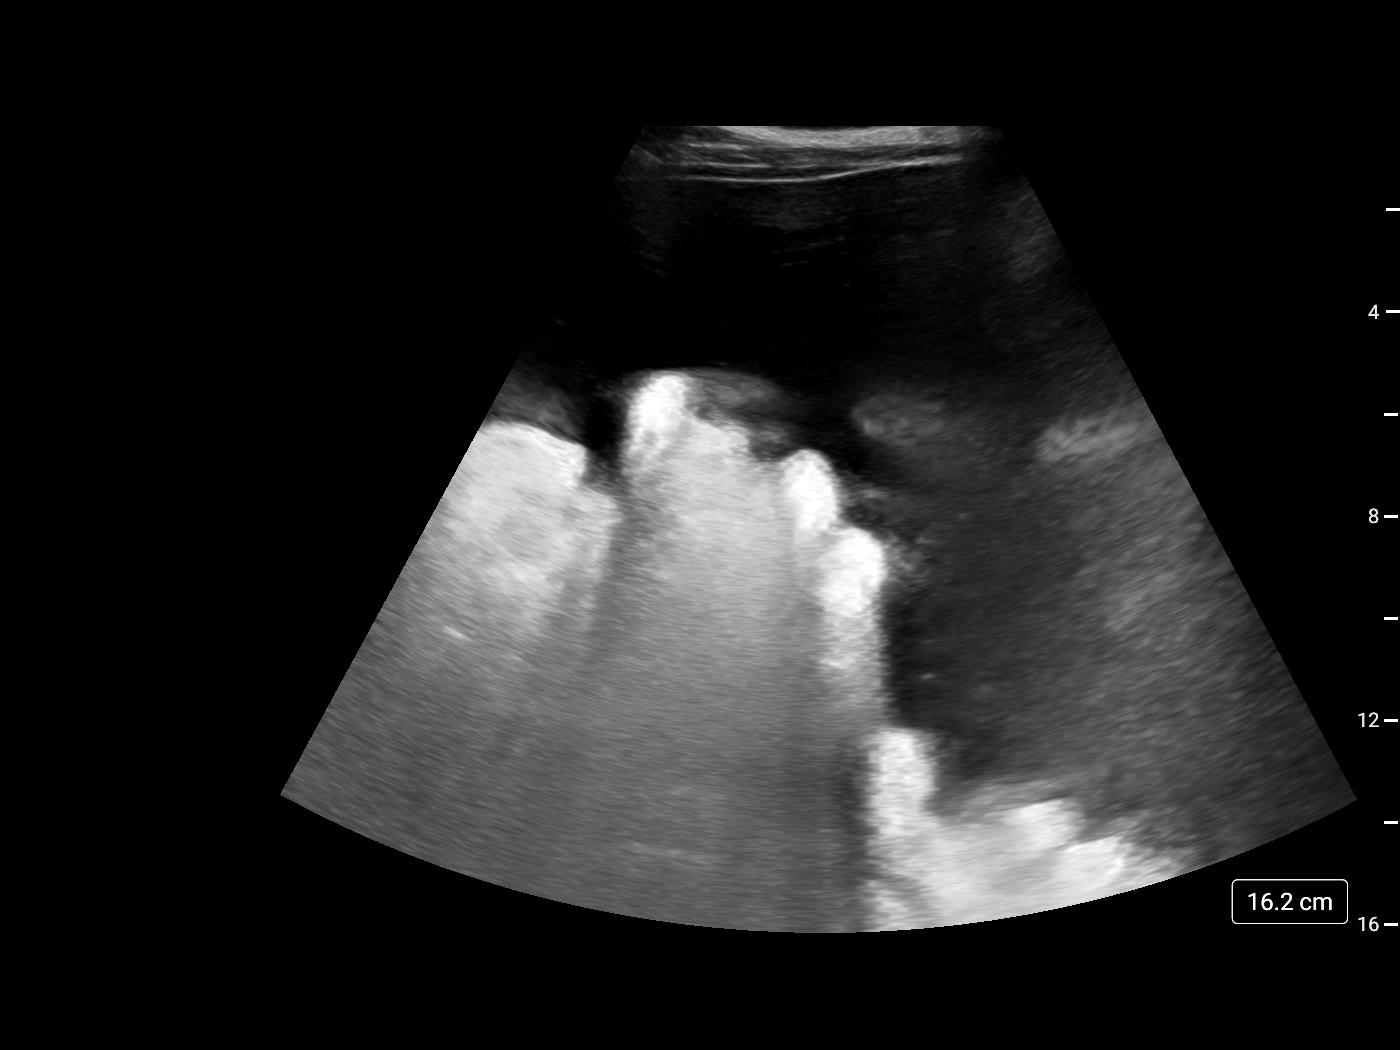
[im 2/3]
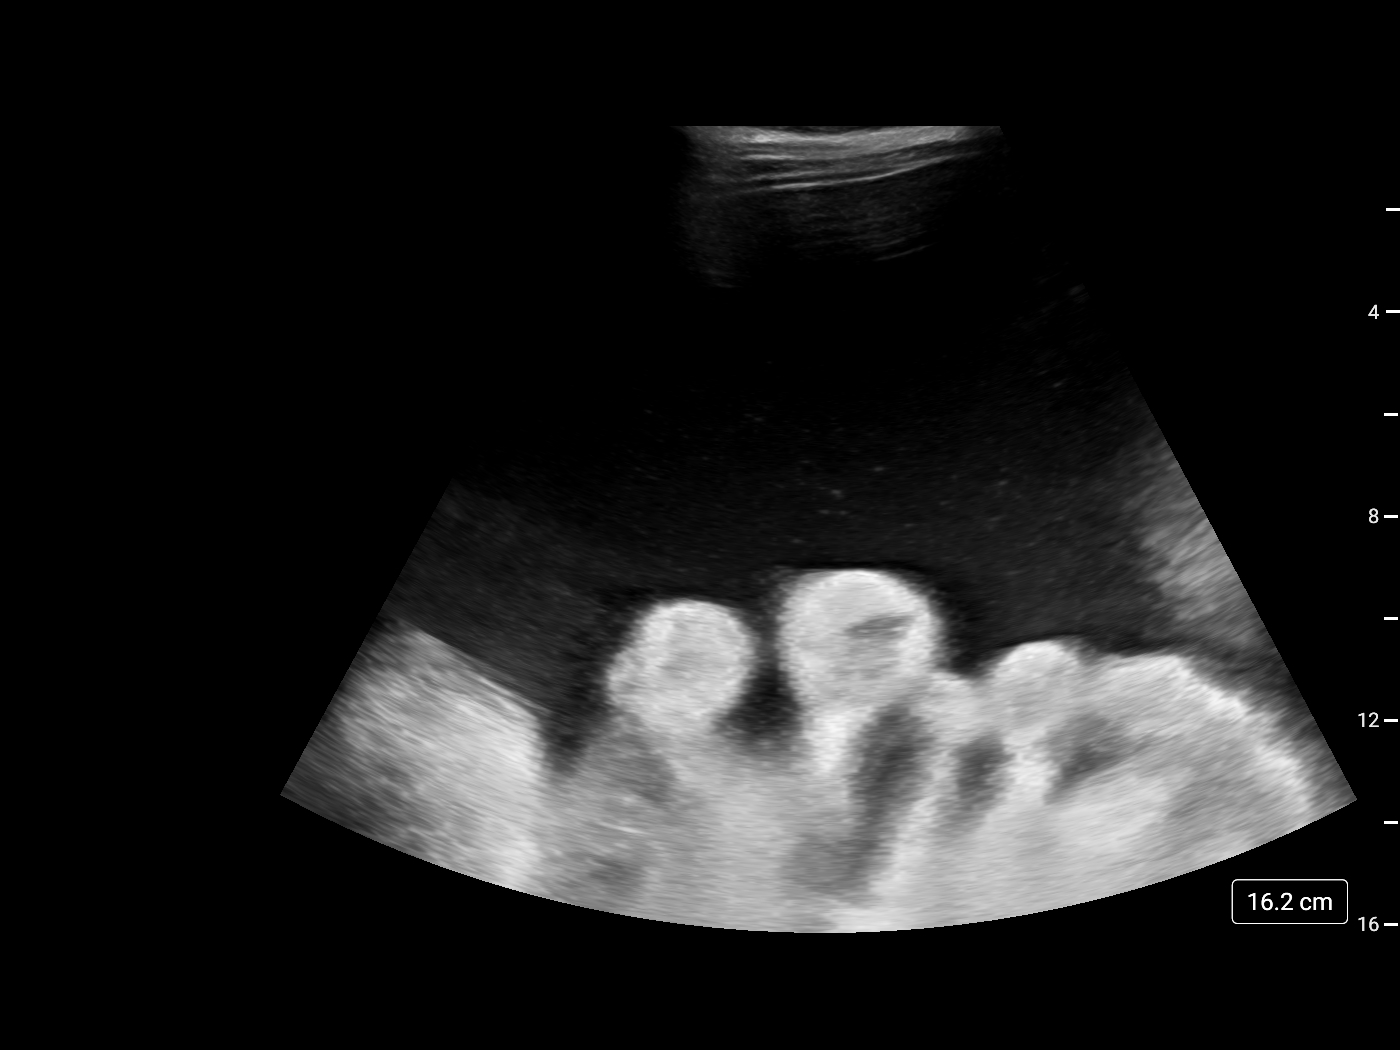
[im 3/3]
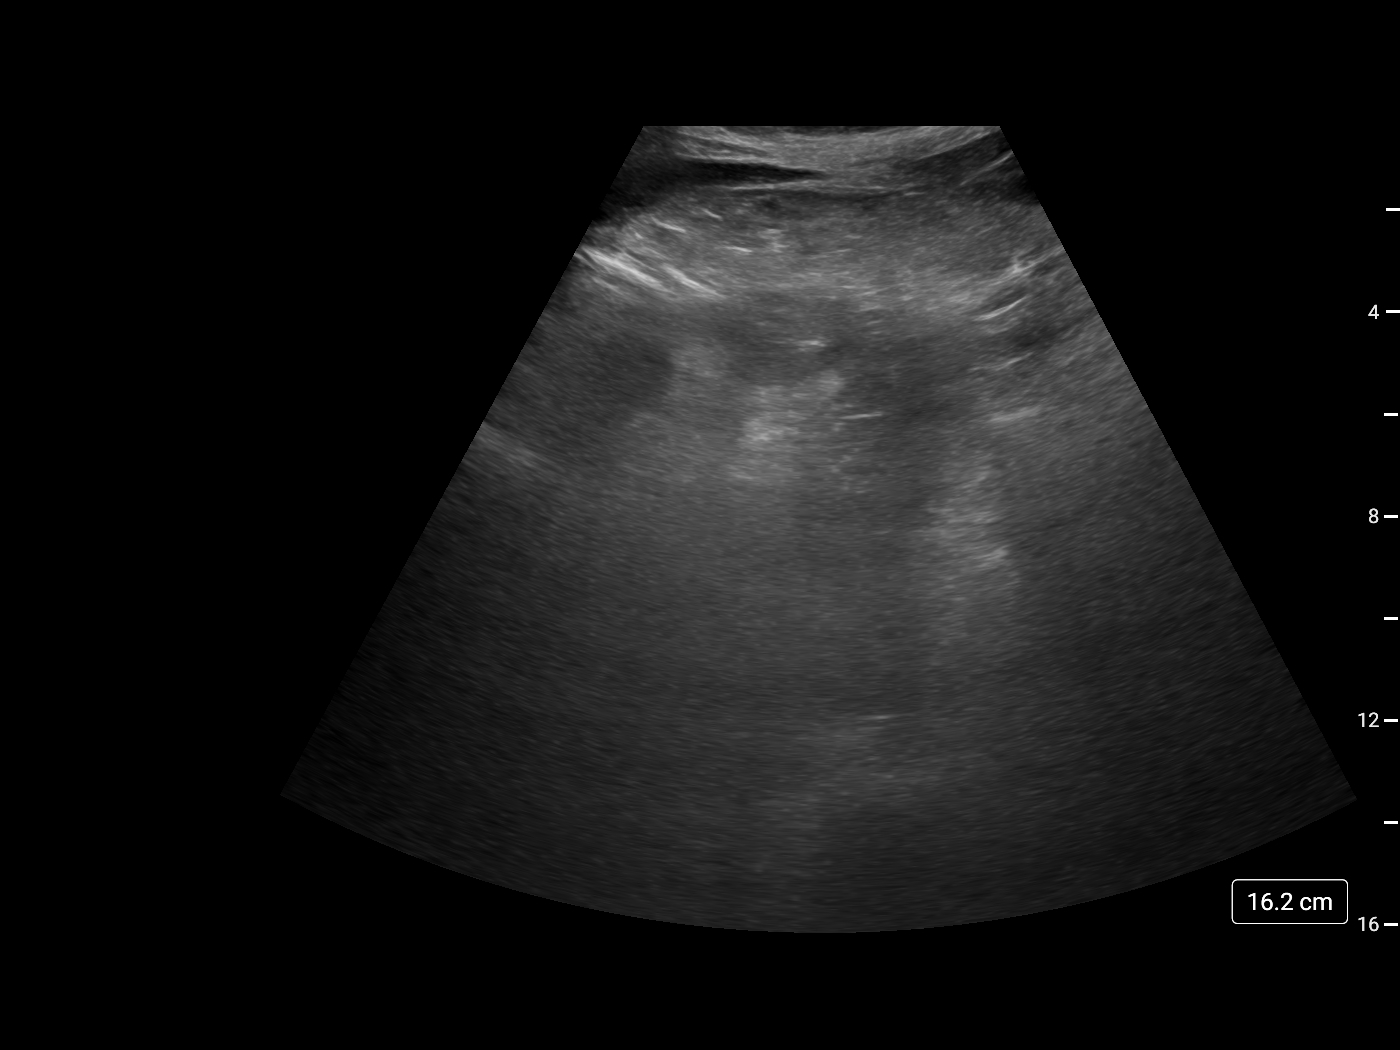

[3 of 3 positions shown; findings below may reference images not displayed]

EXAM:
ULTRASOUND GUIDED THERAPEUTIC PARACENTESIS

MEDICATIONS:
10 mL 1% lidocaine

COMPLICATIONS:
None immediate.

PROCEDURE:
Informed written consent was obtained from the patient after a
discussion of the risks, benefits and alternatives to treatment. A
timeout was performed prior to the initiation of the procedure.

Initial ultrasound scanning demonstrates a large amount of ascites
within the left lower abdominal quadrant. The left lower abdomen was
prepped and draped in the usual sterile fashion. 1% lidocaine was
used for local anesthesia.

Following this, a 19 gauge, 7-cm, Yueh catheter was introduced. An
ultrasound image was saved for documentation purposes. The
paracentesis was performed. The catheter was removed and a dressing
was applied. The patient tolerated the procedure well without
immediate post procedural complication.
FINDINGS: A total of approximately 9.1 L of clear yellow fluid was removed.
IMPRESSION: Successful ultrasound-guided paracentesis yielding 9.1 liters of
peritoneal fluid.

Read by Grimmett, Andrey

## 2021-03-02 IMAGING — US IR PARACENTESIS
1 series · 2 of 2 positions shown · non-contrast
Comparison: none

INDICATION: Patient with history of alcoholic cirrhosis, recurrent ascites.
Request is made for diagnostic and therapeutic paracentesis.

[Series 1: ir (id) (id)/(id)/(id) ir · 2 of 2 slices shown]
[im 1/2]
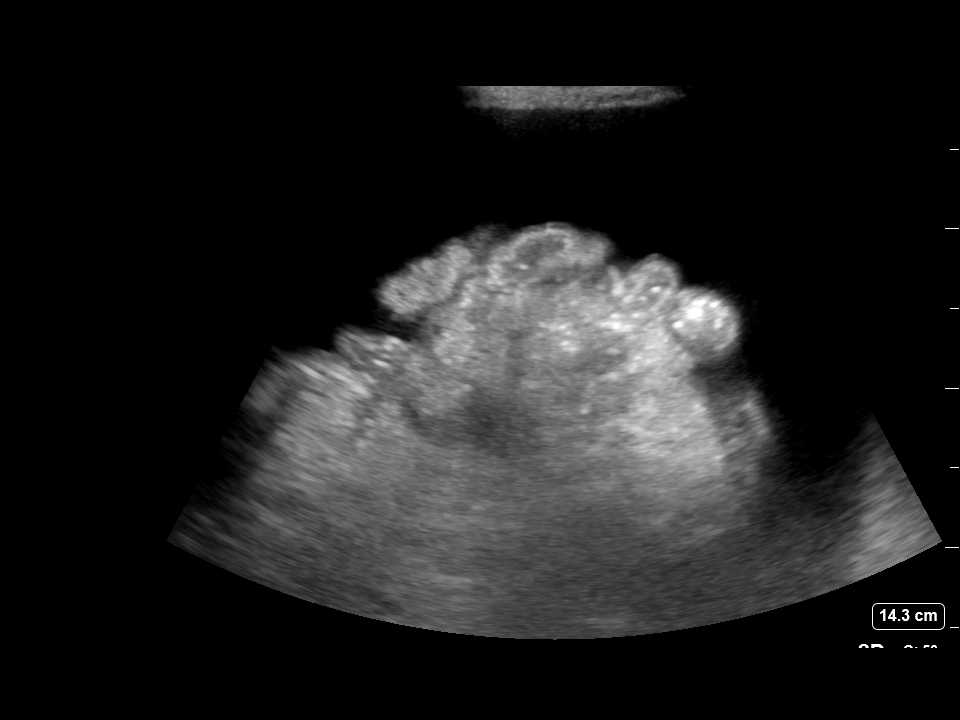
[im 2/2]
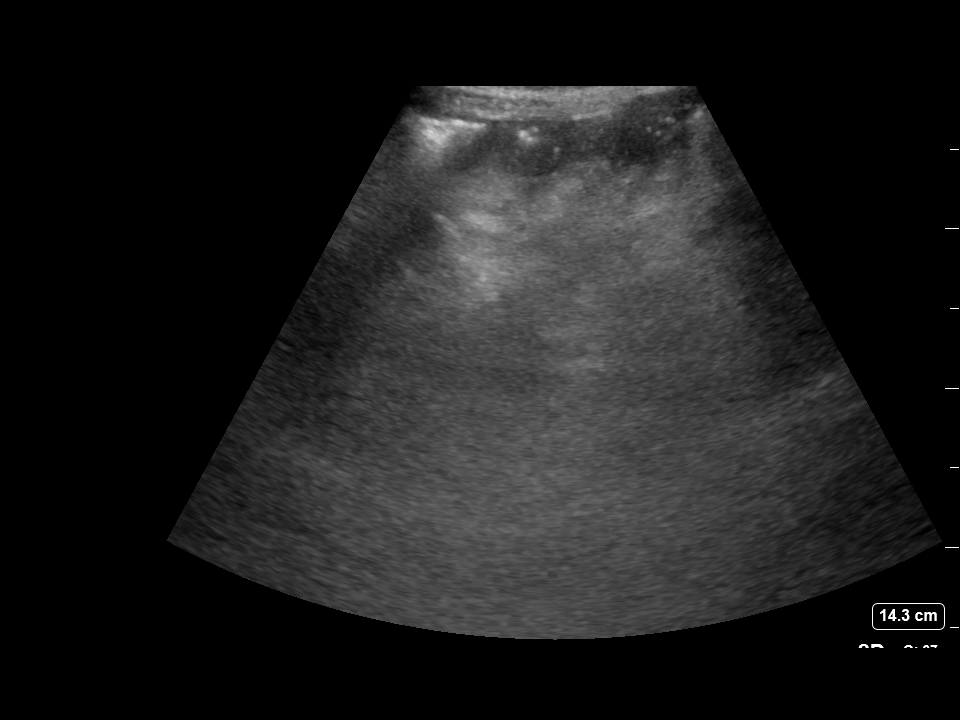

[2 of 2 positions shown; findings below may reference images not displayed]

EXAM:
ULTRASOUND GUIDED DIAGNOSTIC AND THERAPEUTIC PARACENTESIS

MEDICATIONS:
10 mL 1% lidocaine

COMPLICATIONS:
None immediate.

PROCEDURE:
Informed written consent was obtained from the patient after a
discussion of the risks, benefits and alternatives to treatment. A
timeout was performed prior to the initiation of the procedure.

Initial ultrasound scanning demonstrates a moderate amount of
ascites within the right lower abdominal quadrant. The right lower
abdomen was prepped and draped in the usual sterile fashion. 1%
lidocaine was used for local anesthesia.

Following this, a 19 gauge, 7-cm, Yueh catheter was introduced. An
ultrasound image was saved for documentation purposes. The
paracentesis was performed. The catheter was removed and a dressing
was applied. The patient tolerated the procedure well without
immediate post procedural complication.
FINDINGS: A total of approximately 5.0 liters of yellow fluid was removed.
Samples were sent to the laboratory as requested by the clinical
team.
IMPRESSION: Successful ultrasound-guided diagnostic and therapeutic paracentesis
yielding 5.0 liters of peritoneal fluid.

## 2021-04-14 IMAGING — US IR PARACENTESIS
1 series · 1 of 1 positions shown · non-contrast
Comparison: none

INDICATION: Patient with history of cirrhosis with recurrent ascites presents
for therapeutic paracentesis. Team is requesting albumin with a
maximum of 3.5 L

[Series 1: ir (id) (id)/(id)/(id) ir · 1 of 1 slices shown]
[im 1/1]
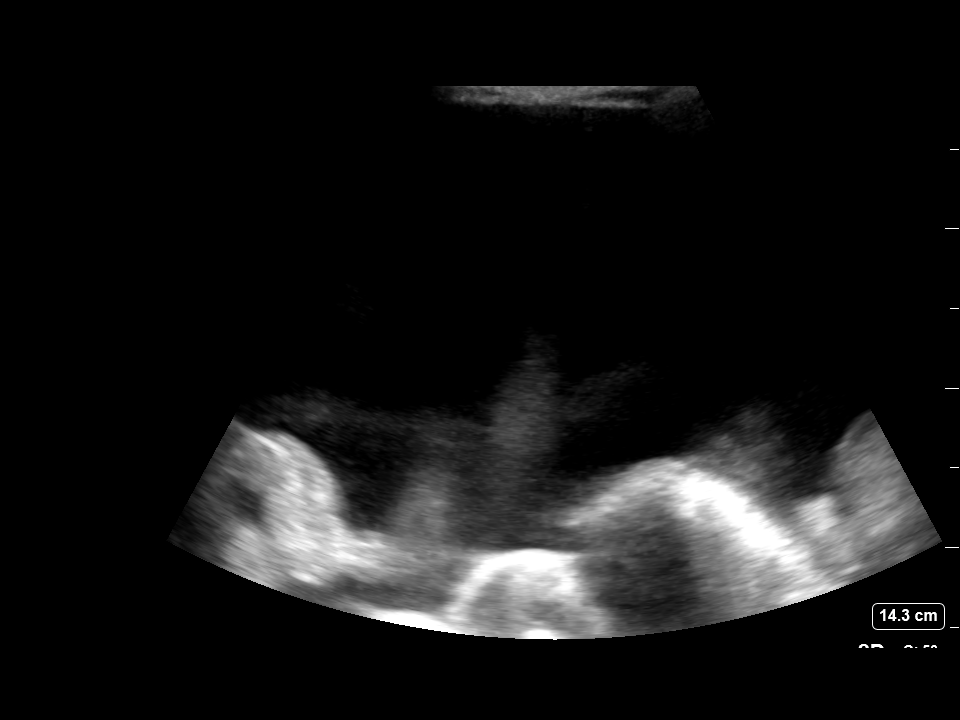

[1 of 1 positions shown; findings below may reference images not displayed]

EXAM:
ULTRASOUND GUIDED therapeutic PARACENTESIS

MEDICATIONS:
Lidocaine 1% 10 mL

COMPLICATIONS:
None immediate.

PROCEDURE:
Informed written consent was obtained from the patient after a
discussion of the risks, benefits and alternatives to treatment. A
timeout was performed prior to the initiation of the procedure.

Initial ultrasound scanning demonstrates a large amount of ascites
within the right lower abdominal quadrant. The right lower abdomen
was prepped and draped in the usual sterile fashion. 1% lidocaine
was used for local anesthesia.

Following this, a 6 Fr Safe-T-Centesis catheter was introduced. An
ultrasound image was saved for documentation purposes. The
paracentesis was performed. The catheter was removed and a dressing
was applied. The patient tolerated the procedure well without
immediate post procedural complication.
Patient received post-procedure intravenous albumin; see nursing
notes for details.
FINDINGS: A total of approximately 3.5 L of straw-colored fluid was removed.
IMPRESSION: Successful ultrasound-guided therapeutic paracentesis yielding
liters of peritoneal fluid.

Read by: Boburshox Tibet, NP

## 2021-04-30 IMAGING — US US HEPATIC LIVER DOPPLER
1 series · 13 of 25 positions shown · non-contrast
Comparison: CT abdomen 11/26/2019

CLINICAL DATA: Decompensated hepatic cirrhosis. Evaluate for portal
or hepatic vein thrombosis.

EXAM:
DUPLEX ULTRASOUND OF LIVER
TECHNIQUE: Color and duplex Doppler ultrasound was performed to evaluate the
hepatic in-flow and out-flow vessels.

[Series 1: us hepatic liver doppler · 13 of 64 slices shown]
[im 1/64]
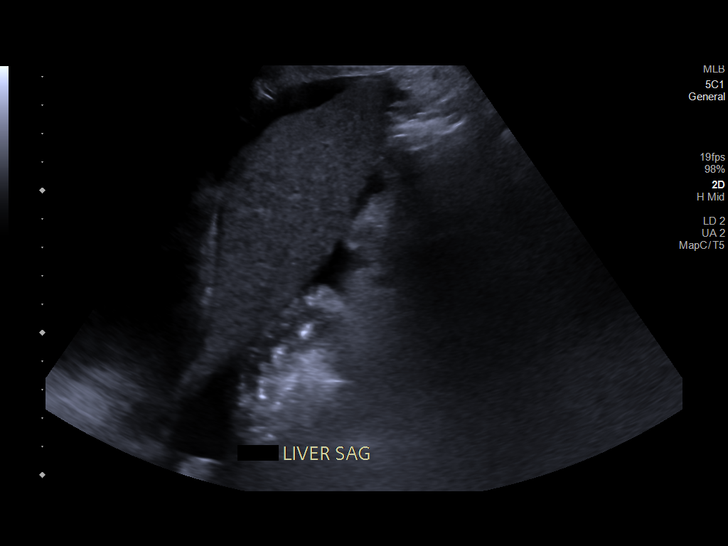
[im 6/64]
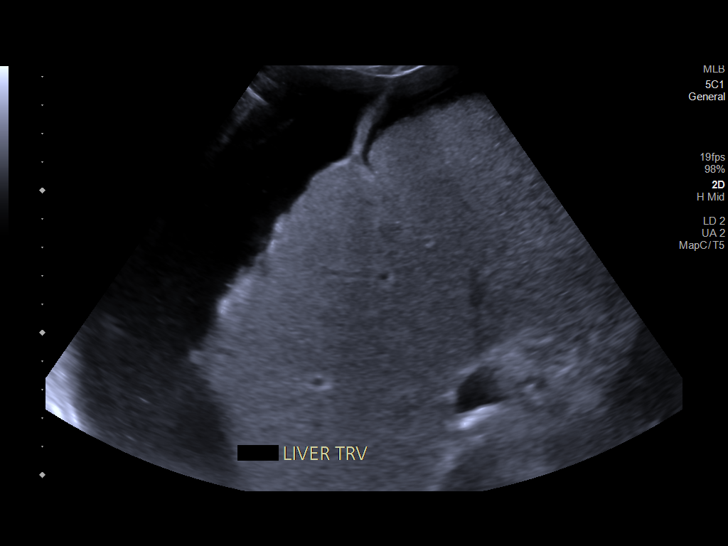
[im 11/64]
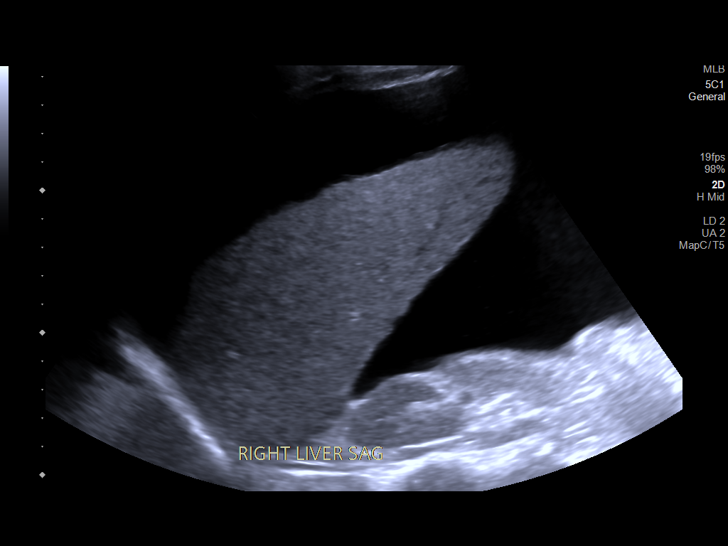
[im 16/64]
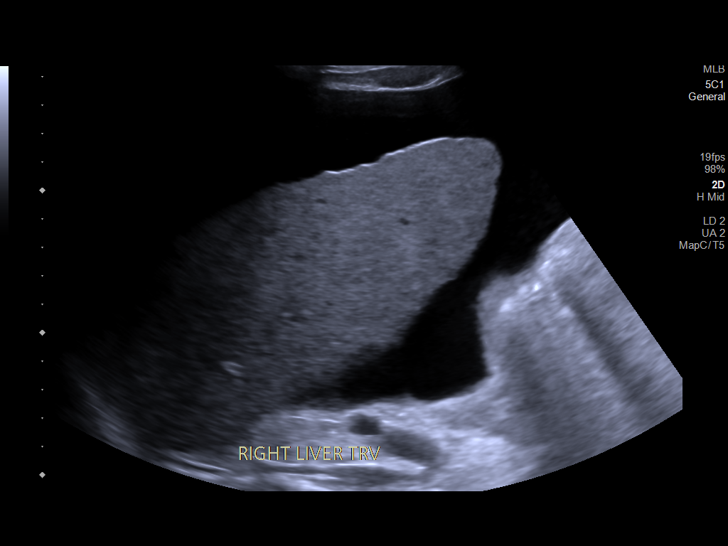
[im 22/64]
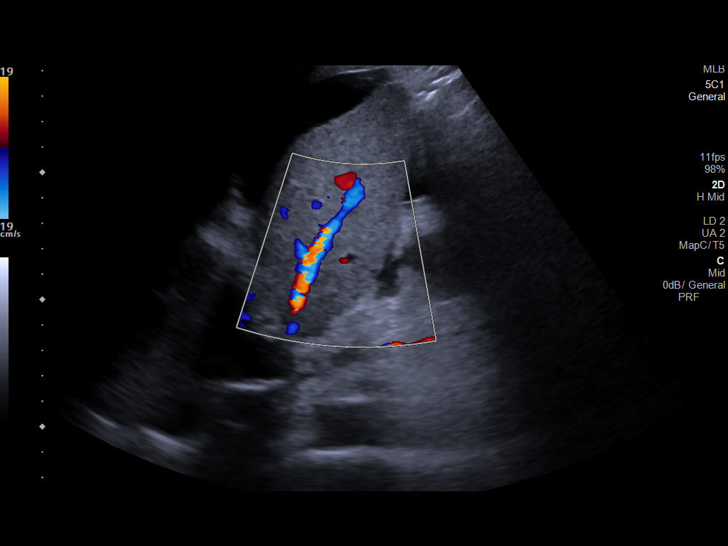
[im 27/64]
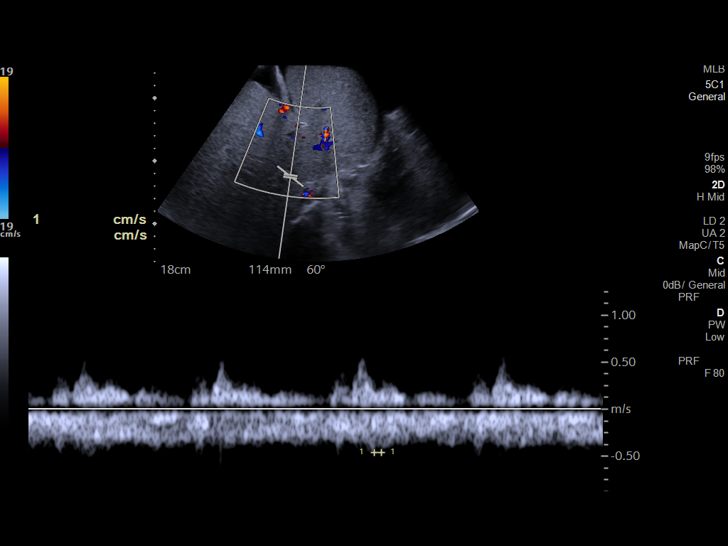
[im 32/64]
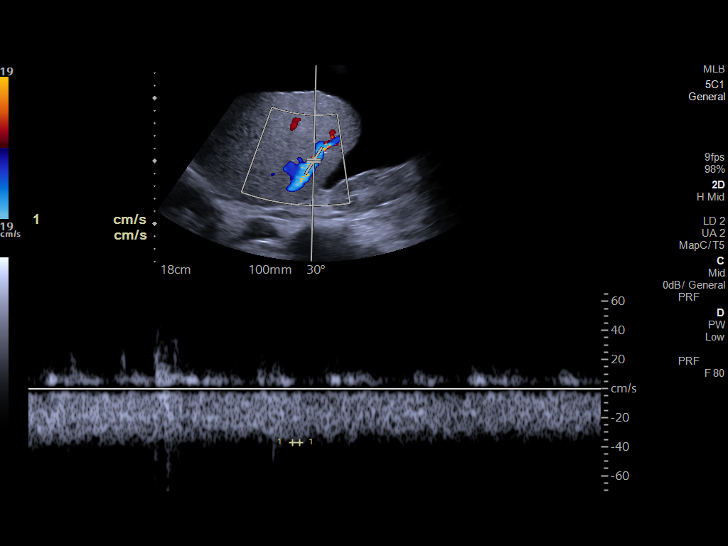
[im 37/64]
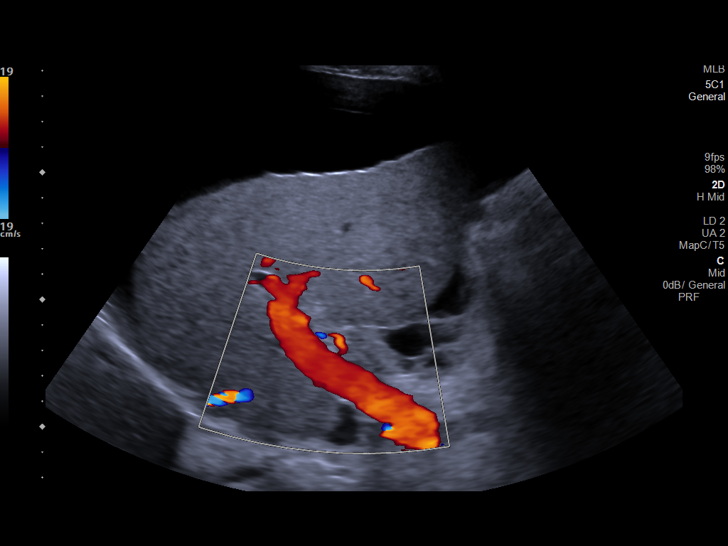
[im 43/64]
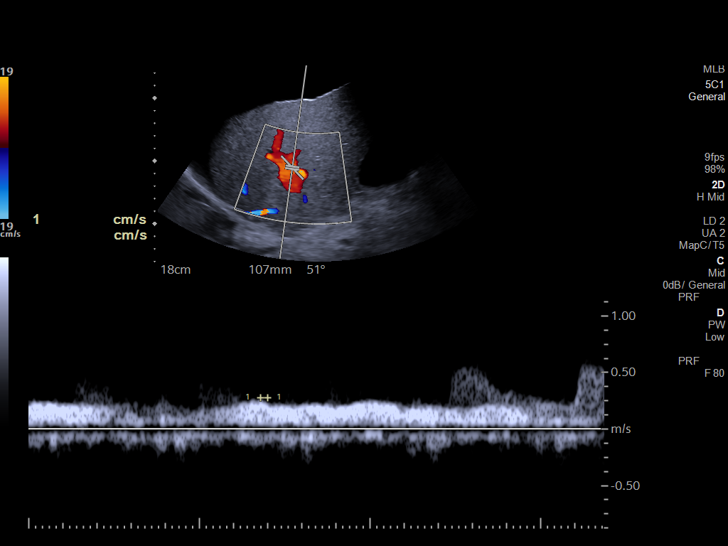
[im 48/64]
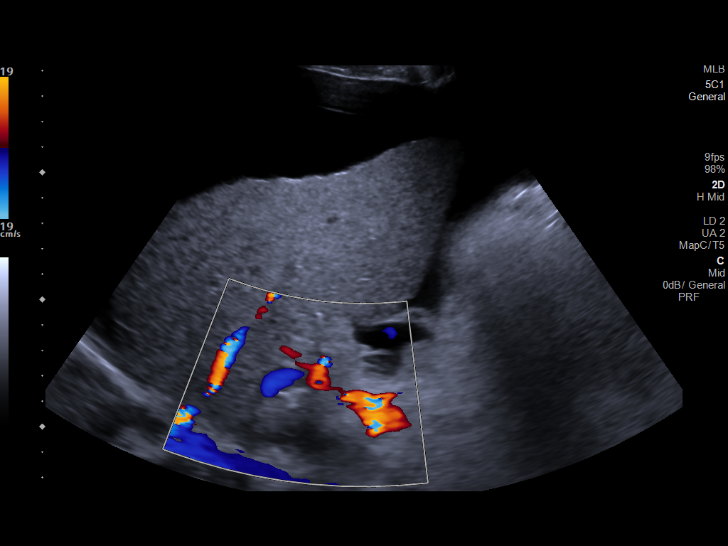
[im 53/64]
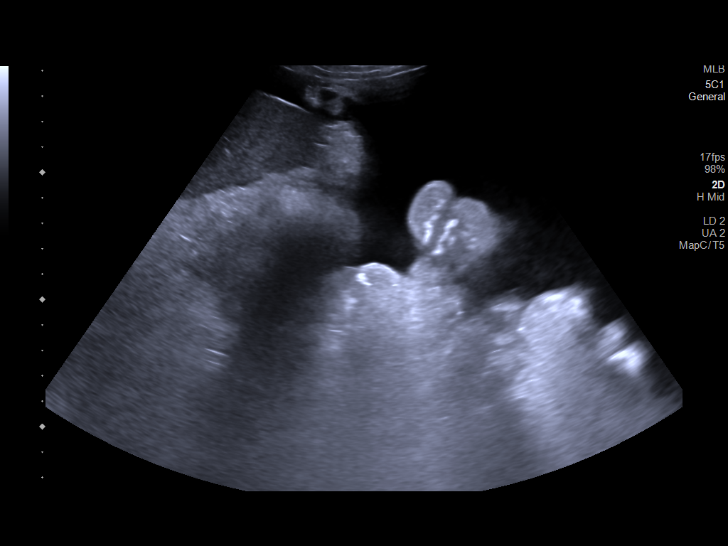
[im 58/64]
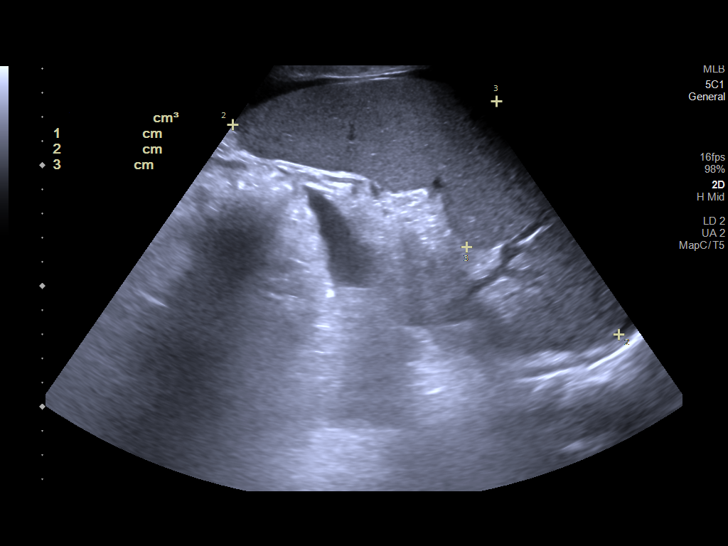
[im 64/64]
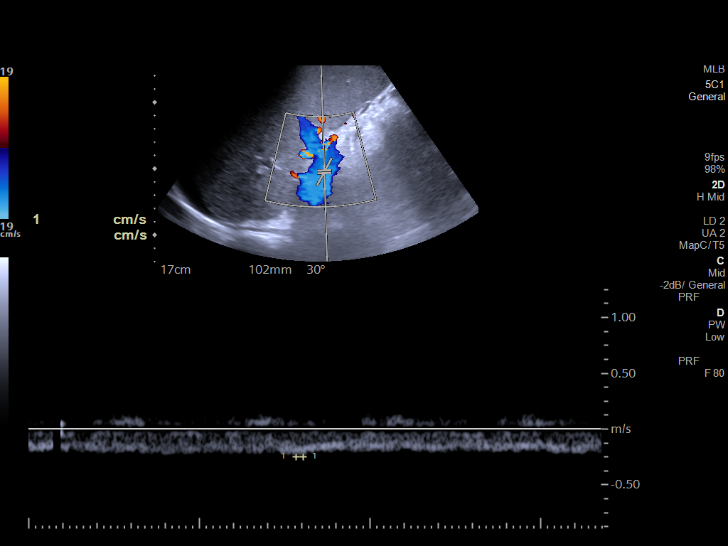

[13 of 25 positions shown; findings below may reference images not displayed]

FINDINGS: Liver: Liver has a nodular contour with surrounding ascites.
Findings are compatible with cirrhosis. No discrete liver lesion.

Main Portal Vein size: 1.5 cm

Portal Vein Velocities

Main Prox:  39 cm/sec

Main Mid: 28 cm/sec

Main Dist:  27 cm/sec
Right: 28 cm/sec
Left: 28 cm/sec

Hepatic Vein Velocities

Right:  37 cm/sec

Middle:  80 cm/sec

Left:  56 cm/sec

IVC: Present and patent with normal respiratory phasicity.

Hepatic Artery Velocity:  89 cm/sec

Splenic Vein Velocity:  25 cm/sec

Spleen: 13.9 cm x 18.2 cm x 6.2 cm with a total volume of 813 cm^3
(411 cm^3 is upper limit normal)

Portal Vein Occlusion/Thrombus: No

Splenic Vein Occlusion/Thrombus: No

Ascites: Present

Varices: None

Large volume ascites. Normal hepatopetal flow in the portal veins.
Normal hepatofugal flow in the hepatic veins.
IMPRESSION: 1. No evidence for portal vein or hepatic vein thrombosis. Normal
direction of flow in the portal venous system.
2. Cirrhosis with large volume ascites and splenomegaly. Findings
are compatible with portal hypertension.

## 2021-05-21 IMAGING — US IR PARACENTESIS
1 series · 1 of 1 positions shown · non-contrast
Comparison: none

INDICATION: Patient with history of alcoholic cirrhosis, recurrent ascites.
Request to IR for diagnostic and therapeutic paracentesis.

[Series 1: (id) · 1 of 1 slices shown]
[im 1/1]
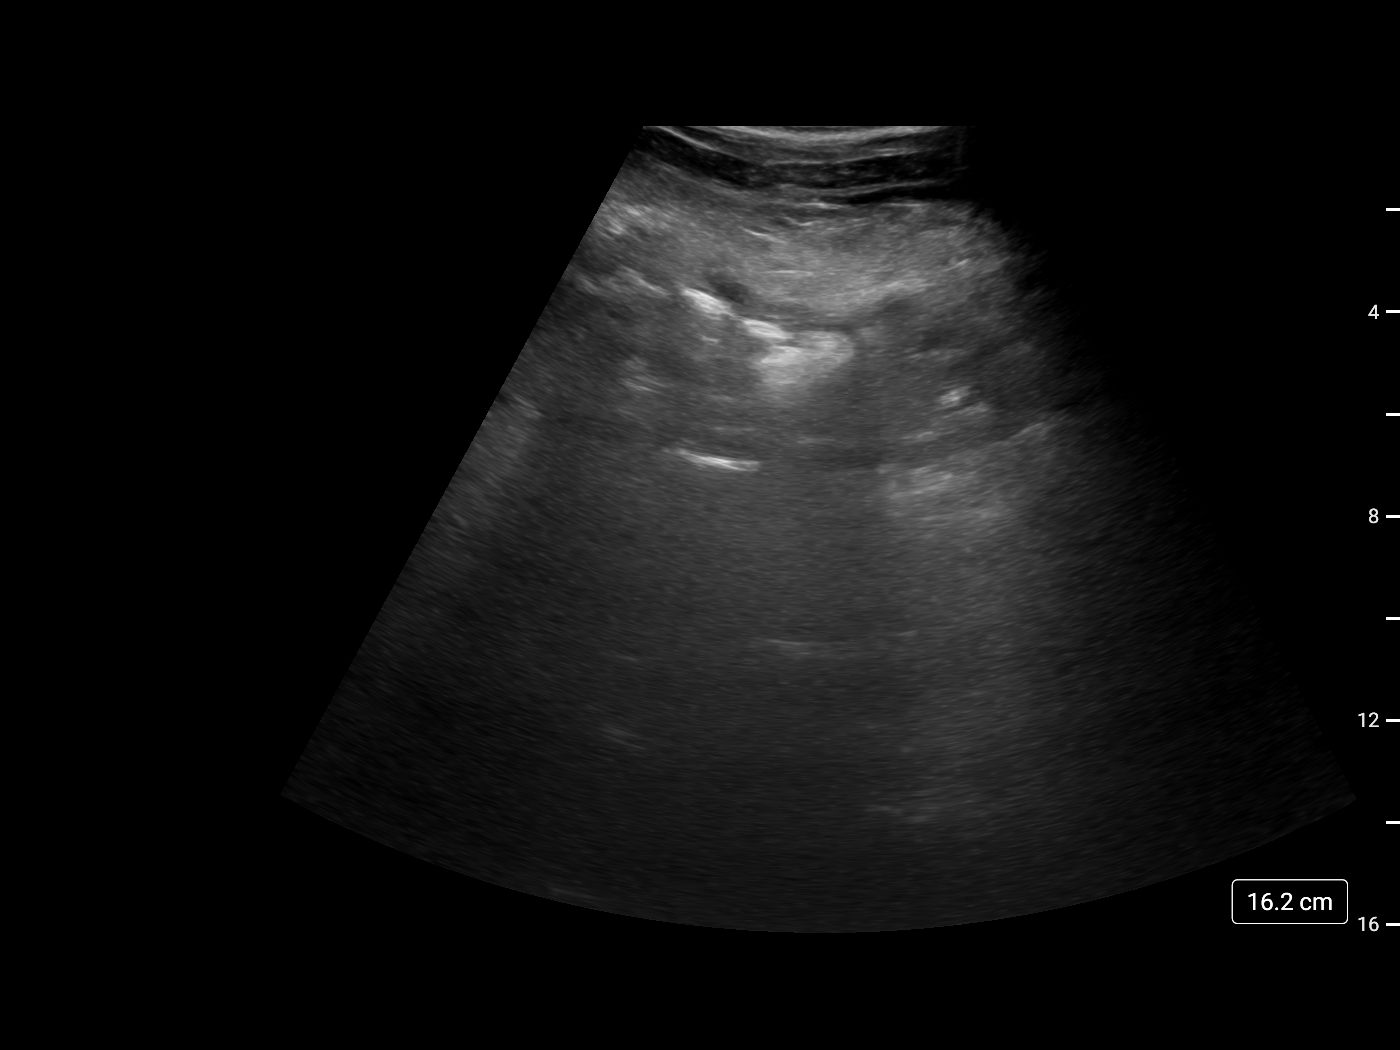

[1 of 1 positions shown; findings below may reference images not displayed]

EXAM:
ULTRASOUND GUIDED DIAGNOSTIC AND THERAPEUTIC PARACENTESIS

MEDICATIONS:
15 mL 1% lidocaine

COMPLICATIONS:
None immediate.

PROCEDURE:
Informed written consent was obtained from the patient after a
discussion of the risks, benefits and alternatives to treatment. A
timeout was performed prior to the initiation of the procedure.

Initial ultrasound scanning demonstrates a large amount of ascites
within the left lower abdominal quadrant. The left lower abdomen was
prepped and draped in the usual sterile fashion. 1% lidocaine was
used for local anesthesia.

Following this, a 19 gauge, 7-cm, Yueh catheter was introduced. An
ultrasound image was saved for documentation purposes. The
paracentesis was performed. The catheter was removed and a dressing
was applied. The patient tolerated the procedure well without
immediate post procedural complication.
FINDINGS: A total of approximately 7.8 L of clear, yellow fluid was removed.
Samples were sent to the laboratory as requested by the clinical
team.
IMPRESSION: Successful ultrasound-guided paracentesis yielding 7.8 liters of
peritoneal fluid.

Read by Grady, Keonna
